# Patient Record
Sex: Male | Born: 1975
Health system: Southern US, Community
[De-identification: ages and names within clinical notes are randomized; demographics above are authoritative.]

## PROBLEM LIST (undated history)

## (undated) DIAGNOSIS — B019 Varicella without complication: Secondary | ICD-10-CM

## (undated) DIAGNOSIS — G4730 Sleep apnea, unspecified: Secondary | ICD-10-CM

## (undated) DIAGNOSIS — C801 Malignant (primary) neoplasm, unspecified: Secondary | ICD-10-CM

## (undated) DIAGNOSIS — E109 Type 1 diabetes mellitus without complications: Secondary | ICD-10-CM

## (undated) DIAGNOSIS — E039 Hypothyroidism, unspecified: Secondary | ICD-10-CM

## (undated) DIAGNOSIS — K219 Gastro-esophageal reflux disease without esophagitis: Secondary | ICD-10-CM

## (undated) DIAGNOSIS — Z8719 Personal history of other diseases of the digestive system: Secondary | ICD-10-CM

## (undated) DIAGNOSIS — R011 Cardiac murmur, unspecified: Secondary | ICD-10-CM

## (undated) DIAGNOSIS — J45909 Unspecified asthma, uncomplicated: Secondary | ICD-10-CM

## (undated) DIAGNOSIS — T7840XA Allergy, unspecified, initial encounter: Secondary | ICD-10-CM

## (undated) DIAGNOSIS — G43909 Migraine, unspecified, not intractable, without status migrainosus: Secondary | ICD-10-CM

## (undated) HISTORY — DX: Cardiac murmur, unspecified: R01.1

## (undated) HISTORY — DX: Unspecified asthma, uncomplicated: J45.909

## (undated) HISTORY — DX: Hypothyroidism, unspecified: E03.9

## (undated) HISTORY — DX: Sleep apnea, unspecified: G47.30

## (undated) HISTORY — DX: Type 1 diabetes mellitus without complications: E10.9

## (undated) HISTORY — DX: Varicella without complication: B01.9

## (undated) HISTORY — DX: Allergy, unspecified, initial encounter: T78.40XA

## (undated) HISTORY — DX: Migraine, unspecified, not intractable, without status migrainosus: G43.909

## (undated) HISTORY — DX: Malignant (primary) neoplasm, unspecified: C80.1

## (undated) HISTORY — DX: Gastro-esophageal reflux disease without esophagitis: K21.9

## (undated) HISTORY — DX: Personal history of other diseases of the digestive system: Z87.19

## (undated) HISTORY — PX: UPPER GI ENDOSCOPY: SHX6162

---

## 2004-08-04 ENCOUNTER — Ambulatory Visit: Payer: Self-pay | Admitting: Internal Medicine

## 2004-09-08 ENCOUNTER — Ambulatory Visit: Payer: Self-pay | Admitting: Internal Medicine

## 2004-09-09 ENCOUNTER — Ambulatory Visit: Payer: Self-pay | Admitting: Internal Medicine

## 2004-09-10 ENCOUNTER — Ambulatory Visit: Payer: Self-pay | Admitting: Gastroenterology

## 2004-09-15 ENCOUNTER — Ambulatory Visit: Payer: Self-pay | Admitting: Gastroenterology

## 2005-05-03 ENCOUNTER — Ambulatory Visit: Payer: Self-pay | Admitting: General Surgery

## 2005-08-02 HISTORY — PX: TONSILLECTOMY AND ADENOIDECTOMY: SUR1326

## 2006-01-28 ENCOUNTER — Ambulatory Visit: Payer: Self-pay | Admitting: Unknown Physician Specialty

## 2006-02-04 ENCOUNTER — Observation Stay: Payer: Self-pay | Admitting: Unknown Physician Specialty

## 2006-06-29 ENCOUNTER — Ambulatory Visit: Payer: Self-pay | Admitting: Family Medicine

## 2006-07-11 ENCOUNTER — Ambulatory Visit: Payer: Self-pay | Admitting: Gastroenterology

## 2006-08-20 ENCOUNTER — Ambulatory Visit: Payer: Self-pay

## 2006-11-23 ENCOUNTER — Emergency Department: Payer: Self-pay | Admitting: Internal Medicine

## 2007-01-19 ENCOUNTER — Ambulatory Visit: Payer: Self-pay | Admitting: Orthopaedic Surgery

## 2007-03-06 ENCOUNTER — Ambulatory Visit: Payer: Self-pay | Admitting: Neurology

## 2007-11-23 ENCOUNTER — Ambulatory Visit: Payer: Self-pay | Admitting: Unknown Physician Specialty

## 2008-02-03 ENCOUNTER — Emergency Department: Payer: Self-pay | Admitting: Emergency Medicine

## 2008-02-03 ENCOUNTER — Other Ambulatory Visit: Payer: Self-pay

## 2008-11-05 ENCOUNTER — Ambulatory Visit: Payer: Self-pay | Admitting: Unknown Physician Specialty

## 2008-11-28 ENCOUNTER — Ambulatory Visit: Payer: Self-pay | Admitting: Unknown Physician Specialty

## 2009-01-23 ENCOUNTER — Ambulatory Visit: Payer: Self-pay | Admitting: Unknown Physician Specialty

## 2009-08-25 ENCOUNTER — Emergency Department: Payer: Self-pay | Admitting: Emergency Medicine

## 2009-09-09 ENCOUNTER — Other Ambulatory Visit: Payer: Self-pay | Admitting: Unknown Physician Specialty

## 2010-04-27 ENCOUNTER — Other Ambulatory Visit: Payer: Self-pay | Admitting: Internal Medicine

## 2010-08-02 HISTORY — PX: APPENDECTOMY: SHX54

## 2011-07-23 ENCOUNTER — Ambulatory Visit: Payer: Self-pay | Admitting: General Surgery

## 2011-07-23 ENCOUNTER — Ambulatory Visit: Payer: Self-pay | Admitting: Family Medicine

## 2012-11-16 ENCOUNTER — Ambulatory Visit (INDEPENDENT_AMBULATORY_CARE_PROVIDER_SITE_OTHER): Payer: Managed Care, Other (non HMO) | Admitting: Internal Medicine

## 2012-11-16 ENCOUNTER — Encounter: Payer: Self-pay | Admitting: Internal Medicine

## 2012-11-16 VITALS — BP 110/70 | HR 88 | Temp 98.7°F | Ht 66.25 in | Wt 192.2 lb

## 2012-11-16 DIAGNOSIS — K219 Gastro-esophageal reflux disease without esophagitis: Secondary | ICD-10-CM

## 2012-11-16 DIAGNOSIS — G589 Mononeuropathy, unspecified: Secondary | ICD-10-CM

## 2012-11-16 DIAGNOSIS — R5383 Other fatigue: Secondary | ICD-10-CM

## 2012-11-16 DIAGNOSIS — G4733 Obstructive sleep apnea (adult) (pediatric): Secondary | ICD-10-CM

## 2012-11-16 DIAGNOSIS — R079 Chest pain, unspecified: Secondary | ICD-10-CM

## 2012-11-16 DIAGNOSIS — E7800 Pure hypercholesterolemia, unspecified: Secondary | ICD-10-CM

## 2012-11-16 DIAGNOSIS — E109 Type 1 diabetes mellitus without complications: Secondary | ICD-10-CM

## 2012-11-16 DIAGNOSIS — G629 Polyneuropathy, unspecified: Secondary | ICD-10-CM

## 2012-11-16 DIAGNOSIS — E119 Type 2 diabetes mellitus without complications: Secondary | ICD-10-CM

## 2012-11-16 DIAGNOSIS — E039 Hypothyroidism, unspecified: Secondary | ICD-10-CM

## 2012-11-16 DIAGNOSIS — R5381 Other malaise: Secondary | ICD-10-CM

## 2012-11-18 ENCOUNTER — Encounter: Payer: Self-pay | Admitting: Internal Medicine

## 2012-11-18 DIAGNOSIS — G4733 Obstructive sleep apnea (adult) (pediatric): Secondary | ICD-10-CM | POA: Insufficient documentation

## 2012-11-18 DIAGNOSIS — G629 Polyneuropathy, unspecified: Secondary | ICD-10-CM | POA: Insufficient documentation

## 2012-11-18 DIAGNOSIS — E039 Hypothyroidism, unspecified: Secondary | ICD-10-CM | POA: Insufficient documentation

## 2012-11-18 DIAGNOSIS — E109 Type 1 diabetes mellitus without complications: Secondary | ICD-10-CM | POA: Insufficient documentation

## 2012-11-18 DIAGNOSIS — K219 Gastro-esophageal reflux disease without esophagitis: Secondary | ICD-10-CM | POA: Insufficient documentation

## 2012-11-18 DIAGNOSIS — E7800 Pure hypercholesterolemia, unspecified: Secondary | ICD-10-CM | POA: Insufficient documentation

## 2012-11-18 NOTE — Assessment & Plan Note (Addendum)
On insulin.  In a diabetic study.  Last a1c 7.1.  Continues to follow up with his endocrinologist.  Continue lisinopril for renal protection.

## 2012-11-18 NOTE — Assessment & Plan Note (Signed)
Has known sleep apnea.  Using his CPAP.  Increased fatigue and daytime somnolence.  Will set up autotitration to confirm settings.

## 2012-11-18 NOTE — Progress Notes (Signed)
Subjective:    Patient ID: Eric Hayden, male    DOB: [birth year], 37 y.o.   MRN: [id number]  HPI 37 year old male with past history of Type I diabetes, hypothyroidism and hypercholesterolemia who comes in today to follow up on these issues as well as for a complete physical exam.  He states he is currently involved in a drug trial.  Is evaluated q 3 months.  One year in to a two year program.  Last a1c 7.1.  He has noticed more intense chest pain recently.  He has a history of neuropathic chest pain.  Takes Lyrica and this helps.  He has noticed recently some increased chest pain.  More intense.  Notices more with increased stress.  Has a long history of diabetes (20 plus years).  No acid reflux.  His reflux is controlled on prilosec and zantac.  Is fatigued.  Working two jobs and going to school.  Feels overwhelmed.  Has good support.  Has CPAP and is using.  Discussed making sure proper settings.     Past Medical History  Diagnosis Date  . Asthma   . Cancer   . Chicken pox   . Type 1 diabetes mellitus   . GERD (gastroesophageal reflux disease)   . Hx of oral aphthous ulcers   . Heart murmur   . Migraines   . Hypothyroidism     Outpatient Encounter Prescriptions as of 11/16/2012  Medication Sig Dispense Refill  . amitriptyline (ELAVIL) 25 MG tablet Take 25 mg by mouth daily.      Marland Kitchen atorvastatin (LIPITOR) 10 MG tablet Take 10 mg by mouth daily.      . insulin lispro (HUMALOG) 100 UNIT/ML injection Inject into the skin continuous as needed for high blood sugar.      . levothyroxine (SYNTHROID, LEVOTHROID) 100 MCG tablet Take 100 mcg by mouth daily before breakfast.      . lisinopril (PRINIVIL,ZESTRIL) 10 MG tablet Take 10 mg by mouth daily.      Marland Kitchen omeprazole (PRILOSEC) 20 MG capsule Take 20 mg by mouth every morning.      . pregabalin (LYRICA) 75 MG capsule Take 75 mg by mouth 2 (two) times daily.      . ranitidine (ZANTAC) 150 MG tablet Take 150 mg by mouth at bedtime.       No  facility-administered encounter medications on file as of 11/16/2012.    Review of Systems Patient denies any headache, lightheadedness or dizziness.  No sinus or allergy symptoms.  chest pain as outlined.  More intense pain.  Persistent intermittent episodes.  Has had diabetes 20 plus years.  No palpitations.  No increased shortness of breath, cough or congestion.  No nausea or vomiting.  Acid reflux controlled.  No abdominal pain or cramping.  No bowel change, such as diarrhea, constipation, BRBPR or melana.  No urine change.  Increased stress as outlined.       Objective:   Physical Exam Filed Vitals:   11/16/12 1038  BP: 110/70  Pulse: 88  Temp: 98.7 F (44.54 C)   37 year old male in no acute distress.  HEENT:  Nares - clear.  Oropharynx - without lesions. NECK:  Supple.  Nontender.  No audible carotid bruit.  HEART:  Appears to be regular.   LUNGS:  No crackles or wheezing audible.  Respirations even and unlabored.   RADIAL PULSE:  Equal bilaterally.  ABDOMEN:  Soft.  Nontender.  Bowel sounds present and normal.  No audible abdominal bruit.  GU:  Normal descended testicles.  No palpable testicular nodules.   RECTAL:  Not performed.   EXTREMITIES:  No increased edema present.  DP pulses palpable and equal bilaterally.   SKIN:  No lesions.         Assessment & Plan:  CARDIOVASCULAR.  Chest pain as outlined.  EKG obtained and revealed SR with no acute ischemic changes.  Given change in pain, increased intensity and long term diabetes (20 plus years), will obtain stress echo to evaluate further.  If any change in symptoms or problems, he is to be reevaluated.   INCREASED PSYCHOSOCIAL STRESSORS.  Increased stress as outlined.  Has good support.  Desires no further intervention.  Follow.    FATIGUE.  Feel multifactorial.  Check cbc, met c and tsh.  Also auto titration.  Follow.   HEALTH MAINTENANCE.  Physical today.  Check cholesterol.

## 2012-11-18 NOTE — Assessment & Plan Note (Signed)
On synthroid.  Check tsh.

## 2012-11-18 NOTE — Assessment & Plan Note (Signed)
On prilosec in the am and zantac in the evening.  Controlled.

## 2012-11-18 NOTE — Assessment & Plan Note (Signed)
Low cholesterol diet.  On lipitor.  Follow.  Check lipid profile and liver panel.

## 2012-11-18 NOTE — Assessment & Plan Note (Signed)
Previous w/up revealed neuropathic chest pain.  On lyrica.  Follow.

## 2012-11-22 ENCOUNTER — Other Ambulatory Visit (INDEPENDENT_AMBULATORY_CARE_PROVIDER_SITE_OTHER): Payer: Managed Care, Other (non HMO)

## 2012-11-22 ENCOUNTER — Encounter: Payer: Self-pay | Admitting: *Deleted

## 2012-11-22 DIAGNOSIS — R5381 Other malaise: Secondary | ICD-10-CM

## 2012-11-22 DIAGNOSIS — R5383 Other fatigue: Secondary | ICD-10-CM

## 2012-11-22 DIAGNOSIS — E7800 Pure hypercholesterolemia, unspecified: Secondary | ICD-10-CM

## 2012-11-22 DIAGNOSIS — E119 Type 2 diabetes mellitus without complications: Secondary | ICD-10-CM

## 2012-11-22 LAB — BASIC METABOLIC PANEL
Calcium: 9 mg/dL (ref 8.4–10.5)
GFR: 95.27 mL/min (ref >60.00)
Glucose, Bld: 101 mg/dL — ABNORMAL HIGH (ref 70–99)
Sodium: 139 mEq/L (ref 135–145)

## 2012-11-22 LAB — CBC WITH DIFFERENTIAL/PLATELET
Basophils Absolute: 0 10*3/uL (ref 0.0–0.1)
HCT: 38.6 % — ABNORMAL LOW (ref 39.0–52.0)
Lymphs Abs: 2.5 10*3/uL (ref 0.7–4.0)
MCV: 84.8 fl (ref 78.0–100.0)
Monocytes Absolute: 0.6 10*3/uL (ref 0.1–1.0)
Platelets: 248 10*3/uL (ref 150.0–400.0)
RDW: 13.3 % (ref 11.5–14.6)

## 2012-11-22 LAB — HEPATIC FUNCTION PANEL
ALT: 20 U/L (ref 0–53)
AST: 15 U/L (ref 0–37)
Total Bilirubin: 0.6 mg/dL (ref 0.3–1.2)

## 2012-11-22 LAB — LIPID PANEL
HDL: 25.5 mg/dL — ABNORMAL LOW (ref >39.00)
Triglycerides: 76 mg/dL (ref 0.0–149.0)

## 2012-11-22 LAB — MICROALBUMIN / CREATININE URINE RATIO: Microalb, Ur: 0.6 mg/dL (ref 0.0–1.9)

## 2012-12-20 ENCOUNTER — Ambulatory Visit: Payer: Managed Care, Other (non HMO) | Admitting: Internal Medicine

## 2013-01-24 ENCOUNTER — Encounter: Payer: Self-pay | Admitting: Internal Medicine

## 2013-01-24 ENCOUNTER — Ambulatory Visit (INDEPENDENT_AMBULATORY_CARE_PROVIDER_SITE_OTHER): Payer: Managed Care, Other (non HMO) | Admitting: Internal Medicine

## 2013-01-24 VITALS — BP 120/80 | HR 79 | Temp 97.9°F | Ht 66.25 in | Wt 194.5 lb

## 2013-01-24 DIAGNOSIS — G589 Mononeuropathy, unspecified: Secondary | ICD-10-CM

## 2013-01-24 DIAGNOSIS — K219 Gastro-esophageal reflux disease without esophagitis: Secondary | ICD-10-CM

## 2013-01-24 DIAGNOSIS — E039 Hypothyroidism, unspecified: Secondary | ICD-10-CM

## 2013-01-24 DIAGNOSIS — G629 Polyneuropathy, unspecified: Secondary | ICD-10-CM

## 2013-01-24 DIAGNOSIS — G4733 Obstructive sleep apnea (adult) (pediatric): Secondary | ICD-10-CM

## 2013-01-24 DIAGNOSIS — E7800 Pure hypercholesterolemia, unspecified: Secondary | ICD-10-CM

## 2013-01-24 DIAGNOSIS — E109 Type 1 diabetes mellitus without complications: Secondary | ICD-10-CM

## 2013-01-28 ENCOUNTER — Encounter: Payer: Self-pay | Admitting: Internal Medicine

## 2013-01-28 NOTE — Assessment & Plan Note (Signed)
On prilosec in the am and zantac in the evening.  Controlled.

## 2013-01-28 NOTE — Assessment & Plan Note (Signed)
Low cholesterol diet.  On lipitor.  Follow.  Follow lipid profile and liver panel.

## 2013-01-28 NOTE — Assessment & Plan Note (Signed)
Previous w/up revealed neuropathic chest pain.  On lyrica.  Follow.

## 2013-01-28 NOTE — Assessment & Plan Note (Signed)
Has known sleep apnea.  Using his CPAP.  Follow.

## 2013-01-28 NOTE — Assessment & Plan Note (Signed)
On insulin.  In a diabetic study.  Last a1c 7.5.  Continues to follow up with his endocrinologist.  Continue lisinopril for renal protection.  Will adjust lisinopril as outlined.

## 2013-01-28 NOTE — Assessment & Plan Note (Signed)
On synthroid.  Follow tsh.   

## 2013-01-28 NOTE — Progress Notes (Signed)
Subjective:    Patient ID: Eric Hayden, male    DOB: [birth year], 37 y.o.   MRN: [id number]  HPI 38 year old male with past history of Type I diabetes, hypothyroidism and hypercholesterolemia who comes in today for a scheduled follow up.   He is currently involved in a drug trial.  Is evaluated q 3 months.  One year in to a two year program.  Last a1c 7.5.  He had noticed more intense chest pain last visit.  He has a history of neuropathic chest pain.  Takes Lyrica and this helps.  Had a stress echo recently that revealed no acute ischemic changes.  Excellent exercise tolerance for age.  No further pain like he was experiencing.  Not having issues with low blood sugars.  Overall feels he is doing well.  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  States he briefly felt funny.  No LOC.  Walked off the field and drank water.  Just felt washed out after the event.  No neurological event.  The following week, he was at a [employer].  Just felt washed out.  No chest pain or tightness.  No sob.  Sugar was fine.  No LOC.  No neurological changes.  Has not had any episodes since.  Reports no symptoms with increased activity or exertion.  Overall now feels he is doing well.  Feels he is handling the increased stress well.     Past Medical History  Diagnosis Date  . Asthma   . Cancer   . Chicken pox   . Type 1 diabetes mellitus   . GERD (gastroesophageal reflux disease)   . Hx of oral aphthous ulcers   . Heart murmur   . Migraines   . Hypothyroidism     Outpatient Encounter Prescriptions as of 01/24/2013  Medication Sig Dispense Refill  . amitriptyline (ELAVIL) 25 MG tablet Take 25 mg by mouth daily.      Marland Kitchen atorvastatin (LIPITOR) 10 MG tablet Take 10 mg by mouth daily.      . insulin lispro (HUMALOG) 100 UNIT/ML injection Inject into the skin continuous as needed for high blood sugar.      . levothyroxine (SYNTHROID, LEVOTHROID) 100 MCG tablet Take 100 mcg by mouth daily before  breakfast.      . lisinopril (PRINIVIL,ZESTRIL) 10 MG tablet Take 10 mg by mouth daily.      Marland Kitchen omeprazole (PRILOSEC) 20 MG capsule Take 20 mg by mouth every morning.      . pregabalin (LYRICA) 75 MG capsule Take 75 mg by mouth 2 (two) times daily.      . ranitidine (ZANTAC) 150 MG tablet Take 150 mg by mouth at bedtime.       No facility-administered encounter medications on file as of 01/24/2013.    Review of Systems Patient denies any headache, lightheadedness or dizziness.  No sinus or allergy symptoms.  No further chest pain as he was experiencing previously.  No palpitations.  No increased shortness of breath, cough or congestion.  No nausea or vomiting.  Acid reflux controlled.  No abdominal pain or cramping.  No bowel change, such as diarrhea, constipation, BRBPR or melana.  No urine change.  Increased stress as outlined.  Feels he is handling things relatively well.  Had the episodes as outlined.  No further episodes.       Objective:   Physical Exam  Filed Vitals:   01/24/13 1025  BP:  120/80  Pulse: 79  Temp: 97.9 F (36.6 C)   Blood pressure recheck:  49/61  37 year old male in no acute distress.  HEENT:  Nares - clear.  Oropharynx - without lesions. NECK:  Supple.  Nontender.  No audible carotid bruit.  HEART:  Appears to be regular.   LUNGS:  No crackles or wheezing audible.  Respirations even and unlabored.   RADIAL PULSE:  Equal bilaterally.  ABDOMEN:  Soft.  Nontender.  Bowel sounds present and normal.  No audible abdominal bruit.  EXTREMITIES:  No increased edema present.  DP pulses palpable and equal bilaterally.   SKIN:  No lesions.         Assessment & Plan:  WEAKNESS.  Had the two episodes as outlined.  None recently.  Feels better.  Discussed staying hydrated.  Recent stress test negative.  No arrhythmia.  No neurological defects.  Will decrease Lisinopril to 50m q day.  Follow pressures.  Get him back in soon to reassess.  Follow.  Let me know if he has any  further episodes.   CARDIOVASCULAR.  Recent chest pain as outlined.  Stress test negative.  See official report for details.  Currently without symptoms.  Follow.    INCREASED PSYCHOSOCIAL STRESSORS.  Increased stress as outlined.  Has good support.  Desires no further intervention.  Follow.   HEALTH MAINTENANCE.  Physical last visit.

## 2013-02-21 ENCOUNTER — Encounter: Payer: Self-pay | Admitting: Internal Medicine

## 2013-02-23 ENCOUNTER — Encounter: Payer: Self-pay | Admitting: Unknown Physician Specialty

## 2013-02-26 ENCOUNTER — Encounter: Payer: Self-pay | Admitting: Internal Medicine

## 2013-03-07 ENCOUNTER — Encounter: Payer: Self-pay | Admitting: *Deleted

## 2013-03-09 ENCOUNTER — Encounter: Payer: Self-pay | Admitting: *Deleted

## 2013-03-19 ENCOUNTER — Encounter: Payer: Self-pay | Admitting: Internal Medicine

## 2013-05-22 ENCOUNTER — Ambulatory Visit (INDEPENDENT_AMBULATORY_CARE_PROVIDER_SITE_OTHER): Payer: Managed Care, Other (non HMO) | Admitting: Internal Medicine

## 2013-05-22 ENCOUNTER — Encounter: Payer: Self-pay | Admitting: Internal Medicine

## 2013-05-22 VITALS — BP 100/80 | HR 92 | Temp 98.2°F | Ht 66.25 in | Wt 193.5 lb

## 2013-05-22 DIAGNOSIS — E7800 Pure hypercholesterolemia, unspecified: Secondary | ICD-10-CM

## 2013-05-22 DIAGNOSIS — E109 Type 1 diabetes mellitus without complications: Secondary | ICD-10-CM

## 2013-05-22 DIAGNOSIS — G4733 Obstructive sleep apnea (adult) (pediatric): Secondary | ICD-10-CM

## 2013-05-22 DIAGNOSIS — G589 Mononeuropathy, unspecified: Secondary | ICD-10-CM

## 2013-05-22 DIAGNOSIS — K219 Gastro-esophageal reflux disease without esophagitis: Secondary | ICD-10-CM

## 2013-05-22 DIAGNOSIS — G629 Polyneuropathy, unspecified: Secondary | ICD-10-CM

## 2013-05-22 DIAGNOSIS — E039 Hypothyroidism, unspecified: Secondary | ICD-10-CM

## 2013-05-22 NOTE — Assessment & Plan Note (Signed)
On prilosec in the am and zantac in the evening.  Controlled.

## 2013-05-22 NOTE — Assessment & Plan Note (Addendum)
Low cholesterol diet.  On lipitor.  Follow.  Follow lipid profile and liver panel.  Due to get labs tomorrow.  Will send me results.

## 2013-05-22 NOTE — Assessment & Plan Note (Addendum)
On insulin.  In a diabetic study.  Last a1c 7.2.  Continues to follow up with his endocrinologist.  Up to date with eye exams.  Sees Dr Thomasene Ripple.  Will stop lisinopril for now.  Follow.

## 2013-05-22 NOTE — Assessment & Plan Note (Signed)
Previous w/up revealed neuropathic chest pain.  On lyrica.  Follow.

## 2013-05-22 NOTE — Progress Notes (Signed)
Subjective:    Patient ID: Eric Hayden, male    DOB: Oct 17, 1975, 37 y.o.   MRN: [id number]  HPI 37 year old male with past history of Type I diabetes, hypothyroidism and hypercholesterolemia who comes in today for a scheduled follow up.   He is currently involved in a drug trial.  Is evaluated q 3 months.  One year in to a two year program.  Last a1c 7.2.  He has a history of neuropathic chest pain.  Takes Lyrica and this is better.   Had a stress echo recently that revealed no acute ischemic changes.  Excellent exercise tolerance for age.  No further pain like he was experiencing.  Not having issues with low blood sugars.  Overall feels he is doing well.  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  States he briefly felt funny.  No LOC.  Walked off the field and drank water.  Just felt washed out after the event.  No neurological changes.  The following week, he was at a [employer].  Just felt washed out.  No chest pain or tightness.  No sob.  Sugar was fine.  No LOC.  No neurological changes.  Last visit, we decreased his lisinopril to 50m q day.   He reports that he had one episode since his last visit, where his sugar was elevated.  He had felt a little sick on his stomach.  Did not eat and went to a soccer game.  Had a very brief episode where he felt a little funny.  Resolved with increased water intake.  No other episodes.  Overall he feels he is doing relatively well.  Increased stress.  Feels he is handling things well.    Past Medical History  Diagnosis Date  . Asthma   . Cancer   . Chicken pox   . Type 1 diabetes mellitus   . GERD (gastroesophageal reflux disease)   . Hx of oral aphthous ulcers   . Heart murmur   . Migraines   . Hypothyroidism     Outpatient Encounter Prescriptions as of 05/22/2013  Medication Sig Dispense Refill  . amitriptyline (ELAVIL) 25 MG tablet Take 25 mg by mouth daily.      .Marland Kitchenatorvastatin (LIPITOR) 10 MG tablet Take 10 mg by mouth daily.       . insulin lispro (HUMALOG) 100 UNIT/ML injection Inject into the skin continuous as needed for high blood sugar.      . levothyroxine (SYNTHROID, LEVOTHROID) 100 MCG tablet Take 100 mcg by mouth daily before breakfast.      . lisinopril (PRINIVIL,ZESTRIL) 10 MG tablet Take 10 mg by mouth daily.      .Marland Kitchenomeprazole (PRILOSEC) 20 MG capsule Take 20 mg by mouth every morning.      . pregabalin (LYRICA) 75 MG capsule Take 75 mg by mouth 2 (two) times daily.      . ranitidine (ZANTAC) 150 MG tablet Take 150 mg by mouth at bedtime.       No facility-administered encounter medications on file as of 05/22/2013.    Review of Systems Patient denies any headache, lightheadedness or dizziness.  No sinus or allergy symptoms.  No further chest pain as he was experiencing previously.  No palpitations.  No increased shortness of breath, cough or congestion.  No nausea or vomiting.  Acid reflux controlled.  No abdominal pain or cramping.  No bowel change, such as diarrhea, constipation, BRBPR or melana.  No urine change.  Increased stress as outlined.  Feels he is handling things relatively well.  Had the episodes as outlined.  No further episodes.       Objective:   Physical Exam  Filed Vitals:   05/22/13 0802  BP: 100/80  Pulse: 92  Temp: 98.2 F (36.8 C)   Blood pressure recheck:  102/64 standing and 100/85 lying  37 year old male in no acute distress.  HEENT:  Nares - clear.  Oropharynx - without lesions. NECK:  Supple.  Nontender.  No audible carotid bruit.  HEART:  Appears to be regular.   LUNGS:  No crackles or wheezing audible.  Respirations even and unlabored.   RADIAL PULSE:  Equal bilaterally.  ABDOMEN:  Soft.  Nontender.  Bowel sounds present and normal.  No audible abdominal bruit.  EXTREMITIES:  No increased edema present.  DP pulses palpable and equal bilaterally.   FEET:  No lesions.         Assessment & Plan:  WEAKNESS.  Had the episodes as outlined.  None recently.  Feels  better.  Discussed staying hydrated.  Recent stress test negative.  No arrhythmia.  No neurological defects. Will stop the lisinopril for now.  Follow pressures.    CARDIOVASCULAR.  Recent chest pain as outlined.  Stress test negative.  See official report for details.  Currently without symptoms.  Follow.    INCREASED PSYCHOSOCIAL STRESSORS.  Increased stress as outlined.  Has good support.  Desires no further intervention.  Follow.   HEALTH MAINTENANCE.  Physical 11/16/12.

## 2013-05-22 NOTE — Assessment & Plan Note (Signed)
Has known sleep apnea.  Using his CPAP.  Follow.

## 2013-05-23 ENCOUNTER — Encounter: Payer: Self-pay | Admitting: Internal Medicine

## 2013-05-23 NOTE — Assessment & Plan Note (Signed)
On synthroid.  Follow tsh.   

## 2013-08-13 ENCOUNTER — Telehealth: Payer: Self-pay | Admitting: Internal Medicine

## 2013-08-13 NOTE — Telephone Encounter (Signed)
Need to know what is going on.  I can see him on Thursday 08/16/13 at 11:45, but I am not sure he feels he can wait this long.  Let me know if a problem.  If a problem, I can work him in at lunch on Wednesday - may have to wait.   Let me know if this is a problem.

## 2013-08-13 NOTE — Telephone Encounter (Signed)
Please advise 

## 2013-08-13 NOTE — Telephone Encounter (Signed)
Pt states he needs to be seen by Dr. Nicki Reaper for kidney pain.  States he is a diabetic.  No appt available this week.  Pt asking if Dr. Nicki Reaper can see him asap.

## 2013-08-14 NOTE — Telephone Encounter (Signed)
Noted  

## 2013-08-14 NOTE — Telephone Encounter (Signed)
Pt coming tomorrow at 10am

## 2013-08-15 ENCOUNTER — Encounter (INDEPENDENT_AMBULATORY_CARE_PROVIDER_SITE_OTHER): Payer: Self-pay

## 2013-08-15 ENCOUNTER — Ambulatory Visit (INDEPENDENT_AMBULATORY_CARE_PROVIDER_SITE_OTHER): Payer: BC Managed Care – PPO | Admitting: Internal Medicine

## 2013-08-15 ENCOUNTER — Encounter: Payer: Self-pay | Admitting: Internal Medicine

## 2013-08-15 VITALS — BP 100/60 | HR 90 | Temp 98.3°F | Ht 66.25 in | Wt 193.8 lb

## 2013-08-15 DIAGNOSIS — E109 Type 1 diabetes mellitus without complications: Secondary | ICD-10-CM

## 2013-08-15 DIAGNOSIS — R1084 Generalized abdominal pain: Secondary | ICD-10-CM

## 2013-08-15 DIAGNOSIS — M549 Dorsalgia, unspecified: Secondary | ICD-10-CM

## 2013-08-15 DIAGNOSIS — M545 Low back pain, unspecified: Secondary | ICD-10-CM

## 2013-08-15 LAB — POCT URINALYSIS DIPSTICK
BILIRUBIN UA: NEGATIVE
Blood, UA: NEGATIVE
Glucose, UA: 500
Ketones, UA: NEGATIVE
LEUKOCYTES UA: NEGATIVE
NITRITE UA: NEGATIVE
PH UA: 7
PROTEIN UA: NEGATIVE
Spec Grav, UA: 1.02
UROBILINOGEN UA: 0.2

## 2013-08-15 LAB — CBC WITH DIFFERENTIAL/PLATELET
Basophils Absolute: 0 10*3/uL (ref 0.0–0.1)
Basophils Relative: 0.4 % (ref 0.0–3.0)
Eosinophils Absolute: 0.3 10*3/uL (ref 0.0–0.7)
Eosinophils Relative: 3.1 % (ref 0.0–5.0)
HEMATOCRIT: 42.1 % (ref 39.0–52.0)
Hemoglobin: 14.3 g/dL (ref 13.0–17.0)
Lymphocytes Relative: 25.8 % (ref 12.0–46.0)
Lymphs Abs: 2.4 10*3/uL (ref 0.7–4.0)
MCHC: 33.9 g/dL (ref 30.0–36.0)
MCV: 83.2 fl (ref 78.0–100.0)
MONOS PCT: 5.9 % (ref 3.0–12.0)
Monocytes Absolute: 0.6 10*3/uL (ref 0.1–1.0)
NEUTROS PCT: 64.8 % (ref 43.0–77.0)
Neutro Abs: 6.1 10*3/uL (ref 1.4–7.7)
PLATELETS: 287 10*3/uL (ref 150.0–400.0)
RBC: 5.06 Mil/uL (ref 4.22–5.81)
RDW: 13.3 % (ref 11.5–14.6)
WBC: 9.4 10*3/uL (ref 4.5–10.5)

## 2013-08-15 LAB — BASIC METABOLIC PANEL
BUN: 17 mg/dL (ref 6–23)
CO2: 29 meq/L (ref 19–32)
Calcium: 9.4 mg/dL (ref 8.4–10.5)
Chloride: 100 mEq/L (ref 96–112)
Creatinine, Ser: 1 mg/dL (ref 0.4–1.5)
GFR: 88.41 mL/min (ref >60.00)
Glucose, Bld: 293 mg/dL — ABNORMAL HIGH (ref 70–99)
POTASSIUM: 4.8 meq/L (ref 3.5–5.1)
SODIUM: 135 meq/L (ref 135–145)

## 2013-08-15 MED ORDER — SULFAMETHOXAZOLE-TMP DS 800-160 MG PO TABS: 1.0000 | ORAL_TABLET | Freq: Two times a day (BID) | ORAL | Status: DC

## 2013-08-15 MED ORDER — OMEPRAZOLE 20 MG PO CPDR: 20.0000 mg | DELAYED_RELEASE_CAPSULE | Freq: Every morning | ORAL | Status: DC

## 2013-08-15 MED ORDER — ATORVASTATIN CALCIUM 10 MG PO TABS: 10.0000 mg | ORAL_TABLET | Freq: Every day | ORAL | Status: DC

## 2013-08-15 MED ORDER — AMITRIPTYLINE HCL 25 MG PO TABS: 25.0000 mg | ORAL_TABLET | Freq: Every day | ORAL | Status: DC

## 2013-08-15 MED ORDER — LISINOPRIL 10 MG PO TABS: 10.0000 mg | ORAL_TABLET | Freq: Every day | ORAL | Status: DC

## 2013-08-15 MED ORDER — LEVOTHYROXINE SODIUM 100 MCG PO TABS
100.0000 ug | ORAL_TABLET | Freq: Every day | ORAL | Status: DC
Start: 2013-08-15 — End: 2014-01-29

## 2013-08-15 MED ORDER — NYSTATIN 100000 UNIT/GM EX CREA
1.0000 | TOPICAL_CREAM | Freq: Two times a day (BID) | CUTANEOUS | Status: DC
Start: 2013-08-15 — End: 2014-05-20

## 2013-08-15 NOTE — Progress Notes (Signed)
Pre-visit discussion using our clinic review tool. No additional management support is needed unless otherwise documented below in the visit note.  

## 2013-08-16 LAB — CULTURE, URINE COMPREHENSIVE
Colony Count: NO GROWTH
Organism ID, Bacteria: NO GROWTH

## 2013-08-19 ENCOUNTER — Encounter: Payer: Self-pay | Admitting: Internal Medicine

## 2013-08-19 DIAGNOSIS — M549 Dorsalgia, unspecified: Secondary | ICD-10-CM | POA: Insufficient documentation

## 2013-08-19 NOTE — Assessment & Plan Note (Signed)
Describes back pain with urinary hesitancy.  Urinalysis negative.  Will send for culture.  Check metabolic panel.  Cover for prostatitis with septra.  No bowel changes.  Stay hydrated.  Further w/up pending response.  He knows if any change in symptoms or persistent problems, he is to be reevaluated.

## 2013-08-19 NOTE — Assessment & Plan Note (Signed)
On insulin.  Was in a diabetic study.  Through with the clinical trial now.  Continues to follow up with his endocrinologist.  Up to date with eye exams.  Sees Dr Thomasene Ripple.   Follow.

## 2013-08-19 NOTE — Progress Notes (Signed)
Subjective:    Patient ID: Eric Hayden, male    DOB: March 27, 1976, 38 y.o.   MRN: [id number]  Flank Pain  38 year old male with past history of Type I diabetes, hypothyroidism and hypercholesterolemia who comes in today as a work in with concerns regarding increased lower back pain and some previous urinary hesitancy.  States symptoms started one week ago with lower back pain.  Increase in sugars last week.  This improved.  Symptoms improved some and then over the weekend worsened.  Lower back pain.  No radiation of pain.  No dysuria.  Some hesitancy.  No hematuria.  On one occasion, noticed pain in testicle.  This has resolved.  No injury or trauma.  No nausea or vomiting.  No fever.  Has taken advil.     Past Medical History  Diagnosis Date  . Asthma   . Cancer   . Chicken pox   . Type 1 diabetes mellitus   . GERD (gastroesophageal reflux disease)   . Hx of oral aphthous ulcers   . Heart murmur   . Migraines   . Hypothyroidism     Outpatient Encounter Prescriptions as of 08/15/2013  Medication Sig  . amitriptyline (ELAVIL) 25 MG tablet Take 1 tablet (25 mg total) by mouth daily.  Marland Kitchen atorvastatin (LIPITOR) 10 MG tablet Take 1 tablet (10 mg total) by mouth daily.  . insulin lispro (HUMALOG) 100 UNIT/ML injection Inject into the skin continuous as needed for high blood sugar.  . levothyroxine (SYNTHROID, LEVOTHROID) 100 MCG tablet Take 1 tablet (100 mcg total) by mouth daily before breakfast.  . lisinopril (PRINIVIL,ZESTRIL) 10 MG tablet Take 1 tablet (10 mg total) by mouth daily.  Marland Kitchen omeprazole (PRILOSEC) 20 MG capsule Take 1 capsule (20 mg total) by mouth every morning.  . pregabalin (LYRICA) 75 MG capsule Take 75 mg by mouth 2 (two) times daily.  . ranitidine (ZANTAC) 150 MG tablet Take 150 mg by mouth at bedtime.  . [DISCONTINUED] amitriptyline (ELAVIL) 25 MG tablet Take 25 mg by mouth daily.  . [DISCONTINUED] atorvastatin (LIPITOR) 10 MG tablet Take 10 mg by mouth daily.  .  [DISCONTINUED] levothyroxine (SYNTHROID, LEVOTHROID) 100 MCG tablet Take 100 mcg by mouth daily before breakfast.  . [DISCONTINUED] lisinopril (PRINIVIL,ZESTRIL) 10 MG tablet Take 10 mg by mouth daily.  . [DISCONTINUED] omeprazole (PRILOSEC) 20 MG capsule Take 20 mg by mouth every morning.  . nystatin cream (MYCOSTATIN) Apply 1 application topically 2 (two) times daily.  Marland Kitchen sulfamethoxazole-trimethoprim (BACTRIM DS) 800-160 MG per tablet Take 1 tablet by mouth 2 (two) times daily.    Review of Systems  Genitourinary: Positive for flank pain.  Patient denies any headache, lightheadedness or dizziness.  No sinus or allergy symptoms.  No further chest pain as he was experiencing previously.  No palpitations.  No increased shortness of breath, cough or congestion.  No nausea or vomiting.  Acid reflux controlled.  No abdominal pain or cramping.  No bowel change, such as diarrhea, constipation, BRBPR or melana. Urinary hesitancy as outlined.  No dysuria.  No hematuria.  Lower back pain as outlined.        Objective:   Physical Exam  Filed Vitals:   08/15/13 1016  BP: 100/60  Pulse: 90  Temp: 98.3 F (36.8 C)   Blood pressure recheck:  16/60  38 year old male in no acute distress.  HEENT:  Nares - clear.  Oropharynx - without lesions. NECK:  Supple.  HEART:  Appears  to be regular.   LUNGS:  No crackles or wheezing audible.  Respirations even and unlabored.   RADIAL PULSE:  Equal bilaterally.  ABDOMEN:  Soft.  Nontender.  Bowel sounds present and normal.  No audible abdominal bruit.  GU:  Normal descended testicles.  No pain to palpation.   RECTAL:  Mildly enlarged prostate.  Heme negative.          Assessment & Plan:  CARDIOVASCULAR.  Recent chest pain as outlined.  Stress test negative.  See official report for details.  Currently without symptoms.  Follow.    HEALTH MAINTENANCE.  Physical 11/16/12.

## 2013-08-23 ENCOUNTER — Ambulatory Visit (INDEPENDENT_AMBULATORY_CARE_PROVIDER_SITE_OTHER): Payer: BC Managed Care – PPO | Admitting: Internal Medicine

## 2013-08-23 ENCOUNTER — Encounter: Payer: Self-pay | Admitting: Internal Medicine

## 2013-08-23 ENCOUNTER — Encounter: Payer: Self-pay | Admitting: *Deleted

## 2013-08-23 VITALS — BP 110/70 | HR 96 | Temp 97.9°F | Ht 66.25 in | Wt 197.8 lb

## 2013-08-23 DIAGNOSIS — G589 Mononeuropathy, unspecified: Secondary | ICD-10-CM

## 2013-08-23 DIAGNOSIS — E039 Hypothyroidism, unspecified: Secondary | ICD-10-CM

## 2013-08-23 DIAGNOSIS — K219 Gastro-esophageal reflux disease without esophagitis: Secondary | ICD-10-CM

## 2013-08-23 DIAGNOSIS — M549 Dorsalgia, unspecified: Secondary | ICD-10-CM

## 2013-08-23 DIAGNOSIS — G629 Polyneuropathy, unspecified: Secondary | ICD-10-CM

## 2013-08-23 DIAGNOSIS — G4733 Obstructive sleep apnea (adult) (pediatric): Secondary | ICD-10-CM

## 2013-08-23 DIAGNOSIS — E109 Type 1 diabetes mellitus without complications: Secondary | ICD-10-CM

## 2013-08-23 DIAGNOSIS — E7800 Pure hypercholesterolemia, unspecified: Secondary | ICD-10-CM

## 2013-08-23 MED ORDER — PREGABALIN 75 MG PO CAPS: 75.0000 mg | ORAL_CAPSULE | Freq: Two times a day (BID) | ORAL | Status: DC

## 2013-08-23 MED ORDER — INSULIN LISPRO 100 UNIT/ML ~~LOC~~ SOLN: SUBCUTANEOUS | Status: DC

## 2013-08-23 NOTE — Progress Notes (Signed)
Pre-visit discussion using our clinic review tool. No additional management support is needed unless otherwise documented below in the visit note.  

## 2013-08-26 ENCOUNTER — Encounter: Payer: Self-pay | Admitting: Internal Medicine

## 2013-08-26 NOTE — Assessment & Plan Note (Signed)
Has known sleep apnea.  Using his CPAP.  Follow.

## 2013-08-26 NOTE — Assessment & Plan Note (Signed)
Described back pain with urinary hesitancy.  See last note for details.  Urinalysis negative.  Urine culture negative.  Treated for possible prostatitis with septra.  Symptoms resolved.  Back to baseline.  Follow.

## 2013-08-26 NOTE — Assessment & Plan Note (Signed)
On prilosec in the am and zantac in the evening.  Controlled.

## 2013-08-26 NOTE — Assessment & Plan Note (Signed)
On synthroid.  Follow tsh.   

## 2013-08-26 NOTE — Assessment & Plan Note (Signed)
Low cholesterol diet.  On lipitor.  Follow.  Follow lipid profile and liver panel.  Due to get labs through [facility].   Will send me results.

## 2013-08-26 NOTE — Progress Notes (Signed)
Subjective:    Patient ID: Eric Hayden, male    DOB: 1976/05/16, 38 y.o.   MRN: [id number]  HPI 38 year old male with past history of Type I diabetes, hypothyroidism and hypercholesterolemia who comes in today for a scheduled follow up.   He was involved in a drug trial.  Was evaluated q 3 months.   Last a1c 7.2.  He is out of the study now.  Sugars starting to increase.  Plans to see his endocrinologist next week.  Due to see Dr Elisabeth Most.  He has a history of neuropathic chest pain.  Takes Lyrica and this is better.   Had a stress echo recently that revealed no acute ischemic changes.  Excellent exercise tolerance for age.  No further pain like he was experiencing.  Not having issues with low blood sugars.  Overall feels he is doing well.  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  States he briefly felt funny.  No LOC.  Walked off the field and drank water.  Just felt washed out after the event.  No neurological changes.  The following week, he was at a [employer].  Just felt washed out.  No chest pain or tightness.  No sob.  Sugar was fine.  No LOC.  No neurological changes.  See previous notes for details.  We stopped his lisinopril.  Has had no further episodes.   He was experiencing some flank pain.  See last note for details.  Was placed on septra.  Doing better now. Symptoms have completely resolved.  Bowels normal.  No nausea or vomiting.  Overall feels back to his baseline.  Feels he is handling stress well.     Past Medical History  Diagnosis Date  . Asthma   . Cancer   . Chicken pox   . Type 1 diabetes mellitus   . GERD (gastroesophageal reflux disease)   . Hx of oral aphthous ulcers   . Heart murmur   . Migraines   . Hypothyroidism     Outpatient Encounter Prescriptions as of 08/23/2013  Medication Sig  . amitriptyline (ELAVIL) 25 MG tablet Take 1 tablet (25 mg total) by mouth daily.  Marland Kitchen atorvastatin (LIPITOR) 10 MG tablet Take 1 tablet (10 mg total) by mouth  daily.  . insulin lispro (HUMALOG) 100 UNIT/ML injection As directed.  Dispense 4 vials.  Marland Kitchen levothyroxine (SYNTHROID, LEVOTHROID) 100 MCG tablet Take 1 tablet (100 mcg total) by mouth daily before breakfast.  . lisinopril (PRINIVIL,ZESTRIL) 10 MG tablet Take 1 tablet (10 mg total) by mouth daily.  Marland Kitchen nystatin cream (MYCOSTATIN) Apply 1 application topically 2 (two) times daily.  Marland Kitchen omeprazole (PRILOSEC) 20 MG capsule Take 1 capsule (20 mg total) by mouth every morning.  . pregabalin (LYRICA) 75 MG capsule Take 1 capsule (75 mg total) by mouth 2 (two) times daily.  . ranitidine (ZANTAC) 150 MG tablet Take 150 mg by mouth at bedtime.  . sulfamethoxazole-trimethoprim (BACTRIM DS) 800-160 MG per tablet Take 1 tablet by mouth 2 (two) times daily.  . [DISCONTINUED] insulin lispro (HUMALOG) 100 UNIT/ML injection Inject into the skin continuous as needed for high blood sugar.  . [DISCONTINUED] pregabalin (LYRICA) 75 MG capsule Take 75 mg by mouth 2 (two) times daily.    Review of Systems Patient denies any headache, lightheadedness or dizziness.  No sinus or allergy symptoms.  No further chest pain as he was experiencing previously.  No palpitations.  No increased shortness  of breath, cough or congestion.  No nausea or vomiting.  Acid reflux controlled.  No abdominal pain or cramping.  No bowel change, such as diarrhea, constipation, BRBPR or melana.  No urine change.  Feels he is handling stress relatively well.  Feels back to his baseline.       Objective:   Physical Exam  Filed Vitals:   08/23/13 0759  BP: 110/70  Pulse: 96  Temp: 97.9 F (36.6 C)   Blood pressure recheck:  73/61  38 year old male in no acute distress.  HEENT:  Nares - clear.  Oropharynx - without lesions. NECK:  Supple.  Nontender.  No audible carotid bruit.  HEART:  Appears to be regular.   LUNGS:  No crackles or wheezing audible.  Respirations even and unlabored.   RADIAL PULSE:  Equal bilaterally.  ABDOMEN:  Soft.   Nontender.  Bowel sounds present and normal.  No audible abdominal bruit.  EXTREMITIES:  No increased edema present.  DP pulses palpable and equal bilaterally.   FEET:  No lesions.         Assessment & Plan:  CARDIOVASCULAR.  Recent chest pain as outlined.  Stress test negative.  See official report for details.  Currently without symptoms.  Follow.    INCREASED PSYCHOSOCIAL STRESSORS.  Increased stress as outlined.  Has good support.  Desires no further intervention.  Follow.   HEALTH MAINTENANCE.  Physical 11/16/12.

## 2013-08-26 NOTE — Assessment & Plan Note (Signed)
Previous w/up revealed neuropathic chest pain.  On lyrica.  Follow.

## 2013-08-26 NOTE — Assessment & Plan Note (Signed)
On insulin.  Was in a diabetic study.  Through with the clinical trial now.  Continues to follow up with his endocrinologist.  Up to date with eye exams.  Sees Dr Thomasene Ripple.   Follow.

## 2013-08-28 ENCOUNTER — Telehealth: Payer: Self-pay | Admitting: *Deleted

## 2013-08-28 NOTE — Telephone Encounter (Signed)
Refill Request  Test strips

## 2013-08-29 NOTE — Telephone Encounter (Signed)
Phoned pharmacy & approved refill

## 2013-09-21 ENCOUNTER — Encounter: Payer: Self-pay | Admitting: Internal Medicine

## 2014-01-29 ENCOUNTER — Other Ambulatory Visit: Payer: Self-pay | Admitting: Internal Medicine

## 2014-02-25 ENCOUNTER — Other Ambulatory Visit: Payer: Self-pay | Admitting: Internal Medicine

## 2014-02-26 ENCOUNTER — Encounter: Payer: BC Managed Care – PPO | Admitting: Internal Medicine

## 2014-03-11 ENCOUNTER — Other Ambulatory Visit: Payer: Self-pay | Admitting: Internal Medicine

## 2014-03-18 ENCOUNTER — Other Ambulatory Visit: Payer: Self-pay | Admitting: Internal Medicine

## 2014-04-01 ENCOUNTER — Other Ambulatory Visit: Payer: Self-pay | Admitting: Internal Medicine

## 2014-04-06 LAB — HEPATIC FUNCTION PANEL
ALT: 30 U/L (ref 10–40)
AST: 24 U/L (ref 14–40)
Alkaline Phosphatase: 102 U/L (ref 25–125)

## 2014-04-06 LAB — TSH: TSH: 5.11 u[IU]/mL (ref 0.41–5.90)

## 2014-04-06 LAB — BASIC METABOLIC PANEL WITH GFR
BUN: 16 mg/dL (ref 4–21)
Creatinine: 1.1 mg/dL (ref 0.6–1.3)
Glucose: 251 mg/dL
Potassium: 5.2 mmol/L (ref 3.4–5.3)
Sodium: 141 mmol/L (ref 137–147)

## 2014-04-06 LAB — LIPID PANEL
CHOLESTEROL: 134 mg/dL (ref 0–200)
LDL Cholesterol: 86 mg/dL

## 2014-04-06 LAB — HEMOGLOBIN A1C: Hgb A1c MFr Bld: 7.3 % — AB (ref 4.0–6.0)

## 2014-04-10 ENCOUNTER — Telehealth: Payer: Self-pay | Admitting: Internal Medicine

## 2014-04-10 NOTE — Telephone Encounter (Signed)
LMTCB to schedule cpe [email or web address]

## 2014-04-10 NOTE — Telephone Encounter (Signed)
Pt is overdue a physical.  Need to schedule.  Thanks.

## 2014-04-16 ENCOUNTER — Other Ambulatory Visit: Payer: Self-pay | Admitting: Internal Medicine

## 2014-04-16 NOTE — Telephone Encounter (Signed)
Appt 10/29

## 2014-05-18 ENCOUNTER — Emergency Department: Payer: Self-pay | Admitting: Emergency Medicine

## 2014-05-18 LAB — URINALYSIS, COMPLETE
Bacteria: NONE SEEN
Bilirubin,UR: NEGATIVE
Blood: NEGATIVE
Glucose,UR: NEGATIVE mg/dL (ref 0–75)
Leukocyte Esterase: NEGATIVE
NITRITE: NEGATIVE
Ph: 9 (ref 4.5–8.0)
Protein: 30
RBC,UR: 1 /HPF (ref 0–5)
SQUAMOUS EPITHELIAL: NONE SEEN
Specific Gravity: 1.02 (ref 1.003–1.030)

## 2014-05-18 LAB — COMPREHENSIVE METABOLIC PANEL
ALBUMIN: 3.4 g/dL (ref 3.4–5.0)
ALK PHOS: 120 U/L — AB
Anion Gap: 7 (ref 7–16)
BUN: 12 mg/dL (ref 7–18)
Bilirubin,Total: 0.5 mg/dL (ref 0.2–1.0)
CO2: 26 mmol/L (ref 21–32)
CREATININE: 1.1 mg/dL (ref 0.60–1.30)
Calcium, Total: 8.6 mg/dL (ref 8.5–10.1)
Chloride: 106 mmol/L (ref 98–107)
EGFR (African American): >60
GLUCOSE: 153 mg/dL — AB (ref 65–99)
Osmolality: 280 (ref 275–301)
POTASSIUM: 4.5 mmol/L (ref 3.5–5.1)
SGOT(AST): 37 U/L (ref 15–37)
SGPT (ALT): 40 U/L
Sodium: 139 mmol/L (ref 136–145)
TOTAL PROTEIN: 7.9 g/dL (ref 6.4–8.2)

## 2014-05-18 LAB — CBC WITH DIFFERENTIAL/PLATELET
Basophil #: 0.1 10*3/uL (ref 0.0–0.1)
Basophil %: 0.6 %
EOS PCT: 0.9 %
Eosinophil #: 0.1 10*3/uL (ref 0.0–0.7)
HCT: 44.5 % (ref 40.0–52.0)
HGB: 14.2 g/dL (ref 13.0–18.0)
LYMPHS ABS: 1.4 10*3/uL (ref 1.0–3.6)
Lymphocyte %: 15.5 %
MCH: 26.7 pg (ref 26.0–34.0)
MCHC: 32 g/dL (ref 32.0–36.0)
MCV: 83 fL (ref 80–100)
MONO ABS: 0.7 x10 3/mm (ref 0.2–1.0)
Monocyte %: 7.4 %
NEUTROS PCT: 75.6 %
Neutrophil #: 7 10*3/uL — ABNORMAL HIGH (ref 1.4–6.5)
PLATELETS: 248 10*3/uL (ref 150–440)
RBC: 5.33 10*6/uL (ref 4.40–5.90)
RDW: 14.1 % (ref 11.5–14.5)
WBC: 9.3 10*3/uL (ref 3.8–10.6)

## 2014-05-18 LAB — SEDIMENTATION RATE: Erythrocyte Sed Rate: 22 mm/hr — ABNORMAL HIGH (ref 0–15)

## 2014-05-20 ENCOUNTER — Telehealth: Payer: Self-pay | Admitting: Internal Medicine

## 2014-05-20 ENCOUNTER — Encounter: Payer: Self-pay | Admitting: Internal Medicine

## 2014-05-20 ENCOUNTER — Ambulatory Visit (INDEPENDENT_AMBULATORY_CARE_PROVIDER_SITE_OTHER): Payer: BC Managed Care – PPO | Admitting: Internal Medicine

## 2014-05-20 VITALS — BP 110/70 | HR 107 | Temp 98.5°F | Ht 66.25 in | Wt 193.5 lb

## 2014-05-20 DIAGNOSIS — R519 Headache, unspecified: Secondary | ICD-10-CM | POA: Insufficient documentation

## 2014-05-20 DIAGNOSIS — E1040 Type 1 diabetes mellitus with diabetic neuropathy, unspecified: Secondary | ICD-10-CM

## 2014-05-20 DIAGNOSIS — R1084 Generalized abdominal pain: Secondary | ICD-10-CM

## 2014-05-20 DIAGNOSIS — R51 Headache: Secondary | ICD-10-CM

## 2014-05-20 DIAGNOSIS — R109 Unspecified abdominal pain: Secondary | ICD-10-CM | POA: Insufficient documentation

## 2014-05-20 MED ORDER — PREDNISONE 5 MG PO TABS: ORAL_TABLET | ORAL | Status: DC

## 2014-05-20 NOTE — Progress Notes (Signed)
Subjective:    Patient ID: Eric Hayden, male    DOB: [birth year], 38 y.o.   MRN: [id number]  Dizziness  38 year old male with past history of Type I diabetes, hypothyroidism and hypercholesterolemia who comes in today as a work in with concerns regarding persistent headache, light headedness, light sensitivity and previous abdominal pain and nausea/vomiting.  States symptoms started five days ago.  Started with emesis.  Felt better later that day.  Some decreased appetite.  The following day another episode - emesis and more frequent stools.  No diarrhea.  Headache started.  Felt worse the following day.  Developed a rash.  Itched.  Some abdominal discomfort.  Went to acute care.  Taking benadryl.  That evening abdominal pain and headache worsened.  To ER.  Labs unrevealing.  CT head - negative.  Given IVFs.  Diagnosed with gastroparesis.  Discharged.  Worked in today with persistent headache.  Worse if stands or goes from lying to sitting.  GI symptoms better.  Ate lunch today. No low blood sugars.  Sugars mostly averaging 200s.  Some light sensitivity.  No neck stiffness.     Past Medical History  Diagnosis Date  . Asthma   . Cancer   . Chicken pox   . Type 1 diabetes mellitus   . GERD (gastroesophageal reflux disease)   . Hx of oral aphthous ulcers   . Heart murmur   . Migraines   . Hypothyroidism     Outpatient Encounter Prescriptions as of 05/20/2014  Medication Sig  . amitriptyline (ELAVIL) 25 MG tablet TAKE 1 TABLET BY MOUTH DAILY  . insulin lispro (HUMALOG) 100 UNIT/ML injection As directed.  Dispense 4 vials.  Marland Kitchen levothyroxine (SYNTHROID, LEVOTHROID) 100 MCG tablet TAKE 1 TABLET BY MOUTH DAILY BEFORE BREAKFAST  . lisinopril (PRINIVIL,ZESTRIL) 10 MG tablet Take 1 tablet (10 mg total) by mouth daily.  Marland Kitchen LYRICA 75 MG capsule TAKE 1 CAPSULE TWICE DAILY  . metoCLOPramide (REGLAN) 10 MG tablet Take 10 mg by mouth every 8 (eight) hours as needed for nausea.  Marland Kitchen omeprazole (PRILOSEC)  20 MG capsule TAKE 1 CAPSULE EVERY MORNING  . ranitidine (ZANTAC) 150 MG tablet Take 150 mg by mouth at bedtime.  . predniSONE (DELTASONE) 5 MG tablet Take 6 tablets x 1 day and then decrease by 1 tablet per day until down to zero mg.  . [DISCONTINUED] atorvastatin (LIPITOR) 10 MG tablet TAKE 1 TABLET BY MOUTH DAILY  . [DISCONTINUED] nystatin cream (MYCOSTATIN) Apply 1 application topically 2 (two) times daily.  . [DISCONTINUED] sulfamethoxazole-trimethoprim (BACTRIM DS) 800-160 MG per tablet Take 1 tablet by mouth 2 (two) times daily.    Review of Systems  Neurological: Positive for dizziness.  Headache, light headedness and dizziness as outlined.  No sinus or allergy symptoms.  No further chest pain as he was experiencing previously.   No nausea or vomiting now.  previous nausea and vomiting as outlined.   Abdominal pain better.   No bowel change, such as diarrhea.  Stool not as frequent.  Ate lunch today.  Light sensitivity.  Rash improved.  Previous itching.  Taking benadryl.       Objective:   Physical Exam  Filed Vitals:   05/20/14 1511  BP: 110/70  Pulse: 107  Temp: 98.5 F (76.57 C)   38 year old male in no acute distress.  HEENT:  Nares - clear.  Oropharynx - without lesions. NECK:  Supple.  Nontender.  No neck stiffness.   HEART:  Appears to be regular.   LUNGS:  No crackles or wheezing audible.  Respirations even and unlabored.  ABDOMEN:  Soft.  No significant tenderness to palpation.  Bowel sounds present and normal.  No audible abdominal bruit.  EXTREMITIES:  No increased edema present.  DP pulses palpable and equal bilaterally.   SKIN:  Faint rash - arms, abdomen, chest and back.         Assessment & Plan:   Problem List Items Addressed This Visit   Abdominal pain     GI symptoms better.  Pain better.  He is on reglan.  Ate today.  No nausea or vomiting now.  No diarrhea.  Hold on further w/up since improved.  Follow.       Headache - Primary     Pt with  persistent headache.  Unclear etiology.  Is worse when he stands or goes from a lying to sitting position.  No neck stiffness.  CT head negative.  Is some better today, but persistent.  Discussed with neurology.  Dr Manuella Ghazi to see pt on 05/22/14.  Will treat with low dose prednisone taper starting at 2m and decreasing by 522meach day until off.  Call with update tomorrow.  If any change or worsening problems, he is to be evaluated immediately.      Type 1 diabetes mellitus     Pt is diabetic.  We discussed the prednisone and monitoring his sugars closely.  Can adjust insulin if needed.  Follow.

## 2014-05-20 NOTE — Telephone Encounter (Signed)
Spoke with patient & he is coming right now. ER records requested

## 2014-05-20 NOTE — Assessment & Plan Note (Signed)
Pt is diabetic.  We discussed the prednisone and monitoring his sugars closely.  Can adjust insulin if needed.  Follow.

## 2014-05-20 NOTE — Patient Instructions (Signed)
appt with Dr Manuella Ghazi - 3:45 Wednesday 05/22/14

## 2014-05-20 NOTE — Progress Notes (Signed)
Pre visit review using our clinic review tool, if applicable. No additional management support is needed unless otherwise documented below in the visit note. 

## 2014-05-20 NOTE — Assessment & Plan Note (Signed)
Pt with persistent headache.  Unclear etiology.  Is worse when he stands or goes from a lying to sitting position.  No neck stiffness.  CT head negative.  Is some better today, but persistent.  Discussed with neurology.  Dr Manuella Ghazi to see pt on 05/22/14.  Will treat with low dose prednisone taper starting at 82m and decreasing by 515meach day until off.  Call with update tomorrow.  If any change or worsening problems, he is to be evaluated immediately.

## 2014-05-20 NOTE — Telephone Encounter (Signed)
Pt's wife called frustrated because she told them this morning that he has already been to Acute Care & then sent to the ER & the ER told them to f/u with PCP. She states that she called @ 8:00 & had to hold for 20 minutes. She asked,  "Whats the point of having a PCP if you cant see them". I told her that I would send the message to Dr. Nicki Reaper & see what the next step would be. She asked could he be seen tomorrow. I told her that I would have to ask Dr. Nicki Reaper. She then stated that this is frustrating that she still doesn't have an appt & asked how would I feel if this was my family member. I again told her that I would get back with her today after speaking to Dr. Nicki Reaper.

## 2014-05-20 NOTE — Telephone Encounter (Signed)
Eric Hayden, the pt's wife called saying he was in the ER on Sat night. He had a severe headache, stomach pain, and a rash. He was told if he didn't feel better to contact his PCP. Per Eric Hayden, Eric Hayden is not feeling better and is in a great deal of pain unable to function. She's wondering if he can be worked in sometime today or this week. Please call the patient. Pt ph# [phone] Thank you.

## 2014-05-20 NOTE — Assessment & Plan Note (Signed)
GI symptoms better.  Pain better.  He is on reglan.  Ate today.  No nausea or vomiting now.  No diarrhea.  Hold on further w/up since improved.  Follow.

## 2014-05-20 NOTE — Telephone Encounter (Signed)
Please notify that I agree with evaluation today.  If he is having increased headache, rash and stomach pain - does need evaluation today.  Since I am ending early, not able to work in today.  I don;t want him to wait.  I recommend acute care.  Can do stat labs if needed.  Can f/u with me after.  Needs ER records and scans.

## 2014-05-20 NOTE — Telephone Encounter (Signed)
Tell them to come on in now.  I will work him in today.  This will be better than waiting until tomorrow.  Need ER records asap.

## 2014-05-21 ENCOUNTER — Telehealth: Payer: Self-pay | Admitting: *Deleted

## 2014-05-21 NOTE — Telephone Encounter (Signed)
Noted  

## 2014-05-21 NOTE — Telephone Encounter (Signed)
I called to check on patient today after he was seen in office yesterday. Pt states that Prednisone is helping & rash has improved. Stomach is okay today & still had a slight headache. Headache stopped about 2 hours ago. Blood sugars have been running between 200-300 & he slept better last night & took a nap today also.

## 2014-05-23 LAB — CULTURE, BLOOD (SINGLE)

## 2014-05-30 ENCOUNTER — Ambulatory Visit (INDEPENDENT_AMBULATORY_CARE_PROVIDER_SITE_OTHER): Payer: BC Managed Care – PPO | Admitting: Internal Medicine

## 2014-05-30 ENCOUNTER — Encounter: Payer: Self-pay | Admitting: Internal Medicine

## 2014-05-30 VITALS — BP 118/80 | HR 93 | Temp 98.3°F | Ht 66.5 in | Wt 193.5 lb

## 2014-05-30 DIAGNOSIS — R1084 Generalized abdominal pain: Secondary | ICD-10-CM

## 2014-05-30 DIAGNOSIS — R519 Headache, unspecified: Secondary | ICD-10-CM

## 2014-05-30 DIAGNOSIS — E1040 Type 1 diabetes mellitus with diabetic neuropathy, unspecified: Secondary | ICD-10-CM

## 2014-05-30 DIAGNOSIS — K219 Gastro-esophageal reflux disease without esophagitis: Secondary | ICD-10-CM

## 2014-05-30 DIAGNOSIS — R51 Headache: Secondary | ICD-10-CM

## 2014-05-30 DIAGNOSIS — G629 Polyneuropathy, unspecified: Secondary | ICD-10-CM

## 2014-05-30 DIAGNOSIS — E7800 Pure hypercholesterolemia, unspecified: Secondary | ICD-10-CM

## 2014-05-30 DIAGNOSIS — G4733 Obstructive sleep apnea (adult) (pediatric): Secondary | ICD-10-CM

## 2014-05-30 DIAGNOSIS — E039 Hypothyroidism, unspecified: Secondary | ICD-10-CM

## 2014-05-30 NOTE — Progress Notes (Signed)
Pre visit review using our clinic review tool, if applicable. No additional management support is needed unless otherwise documented below in the visit note. 

## 2014-06-08 ENCOUNTER — Encounter: Payer: Self-pay | Admitting: Internal Medicine

## 2014-06-08 NOTE — Progress Notes (Signed)
Subjective:    Patient ID: Eric Hayden, male    DOB: [birth year], 38 y.o.   MRN: [id number]  HPI 38 year old male with past history of Type I diabetes, hypothyroidism and hypercholesterolemia who comes in today to follow up on these issues as well as for a complete physical exam.  Sees Dr Elisabeth Most for his diabetes.   He has a history of neuropathic chest pain.  Takes Lyrica and this is better.   Had a stress echo recently that revealed no acute ischemic changes.  Excellent exercise tolerance for age.  No further pain like he was experiencing.  Not having issues with low blood sugars.  Overall feels he is doing well.  Feels better.  Recently evaluated for headaches, dizziness and abdominal pain.  See last note for details.  Prednisone eased headache.  Saw neurology.  See Dr Trena Platt note for details.  Was given robaxin and metntax.  Not taking.  Feels back to normal.  No headache or dizziness.  Eating and drinking well.  Sugars coming back down from being on prednisone.  No abdominal pain or cramping.  Off reglan.  Bowels stable.     Past Medical History  Diagnosis Date  . Asthma   . Cancer   . Chicken pox   . Type 1 diabetes mellitus   . GERD (gastroesophageal reflux disease)   . Hx of oral aphthous ulcers   . Heart murmur   . Migraines   . Hypothyroidism     Outpatient Encounter Prescriptions as of 05/30/2014  Medication Sig  . amitriptyline (ELAVIL) 25 MG tablet TAKE 1 TABLET BY MOUTH DAILY  . insulin lispro (HUMALOG) 100 UNIT/ML injection As directed.  Dispense 4 vials.  Marland Kitchen levothyroxine (SYNTHROID, LEVOTHROID) 112 MCG tablet Take 112 mcg by mouth daily before breakfast.  . lisinopril (PRINIVIL,ZESTRIL) 10 MG tablet Take 1 tablet (10 mg total) by mouth daily.  Marland Kitchen LYRICA 75 MG capsule TAKE 1 CAPSULE TWICE DAILY  . omeprazole (PRILOSEC) 20 MG capsule TAKE 1 CAPSULE EVERY MORNING  . ranitidine (ZANTAC) 150 MG tablet Take 150 mg by mouth at bedtime.  . [DISCONTINUED] levothyroxine  (SYNTHROID, LEVOTHROID) 100 MCG tablet TAKE 1 TABLET BY MOUTH DAILY BEFORE BREAKFAST  . [DISCONTINUED] metoCLOPramide (REGLAN) 10 MG tablet Take 10 mg by mouth every 8 (eight) hours as needed for nausea.  . [DISCONTINUED] predniSONE (DELTASONE) 5 MG tablet Take 6 tablets x 1 day and then decrease by 1 tablet per day until down to zero mg.    Review of Systems Patient denies any headache, lightheadedness or dizziness.  No sinus or allergy symptoms.  No further chest pain as he was experiencing previously.  No palpitations.  No increased shortness of breath, cough or congestion.  No nausea or vomiting.  Acid reflux controlled.  No abdominal pain or cramping.  No bowel change, such as diarrhea, constipation, BRBPR or melana.  No urine change.  Feels he is handling stress relatively well.  Feels back to his baseline.       Objective:   Physical Exam  Filed Vitals:   05/30/14 0926  BP: 118/80  Pulse: 93  Temp: 98.3 F (36.8 C)   Blood pressure recheck:  11/75  38 year old male in no acute distress.  HEENT:  Nares - clear.  Oropharynx - without lesions. NECK:  Supple.  Nontender.  No audible carotid bruit.  HEART:  Appears to be regular.   LUNGS:  No crackles or wheezing audible.  Respirations even and unlabored.   RADIAL PULSE:  Equal bilaterally.  ABDOMEN:  Soft.  Nontender.  Bowel sounds present and normal.  No audible abdominal bruit.  GU:  Normal descended testicles.  No palpable testicular nodules.  EXTREMITIES:  No increased edema present.  DP pulses palpable and equal bilaterally.     FEET:  No lesions.          Assessment & Plan:  CARDIOVASCULAR.  Recent chest pain as outlined.  Stress test negative.  See official report for details.  Currently without symptoms.  Follow.    INCREASED PSYCHOSOCIAL STRESSORS.  Stress better.  Has good support.  Desires no further intervention.  Follow.   Obstructive sleep apnea Using CPAP.   Gastroesophageal reflux disease, esophagitis  presence not specified On omeprazole.  Controlled.   Type 1 diabetes mellitus with diabetic neuropathy Sugars coming back down after being off prednisone.  Sees endocrinology.  Up to date with eye exams.    Hypothyroidism, unspecified hypothyroidism type On synthroid.  Follow tsh.  Lab Results  Component Value Date   TSH 5.11 04/06/2014   Neuropathy Controlled on lyrica.  Dr Manuella Ghazi discussed metanx.    Hypercholesterolemia Low cholesterol diet and exercise.  Follow lipid panel.  Lab Results  Component Value Date   CHOL 134 04/06/2014   HDL 25.50* 11/22/2012   LDLCALC 86 04/06/2014   TRIG 76.0 11/22/2012   CHOLHDL 5 11/22/2012   Acute nonintractable headache, unspecified headache type Headache resolved.  Saw Dr Manuella Ghazi.  Took the prednisone.  See Dr Trena Platt note for details.    Generalized abdominal pain Resolved.    HEALTH MAINTENANCE.  Physical today.

## 2014-06-17 ENCOUNTER — Other Ambulatory Visit: Payer: Self-pay | Admitting: Internal Medicine

## 2014-06-24 ENCOUNTER — Other Ambulatory Visit: Payer: Self-pay | Admitting: Internal Medicine

## 2014-06-25 NOTE — Telephone Encounter (Signed)
Refilled lyrica #60 with 2 refills.

## 2014-06-25 NOTE — Telephone Encounter (Signed)
Rx faxed

## 2014-06-25 NOTE — Telephone Encounter (Signed)
Last refill 10.26.15, last OV 10.29.15.  Please advise refill

## 2014-07-05 ENCOUNTER — Other Ambulatory Visit: Payer: Self-pay | Admitting: Internal Medicine

## 2014-07-30 ENCOUNTER — Ambulatory Visit: Payer: BC Managed Care – PPO | Admitting: Internal Medicine

## 2014-08-26 ENCOUNTER — Other Ambulatory Visit: Payer: Self-pay | Admitting: Internal Medicine

## 2014-08-26 NOTE — Telephone Encounter (Signed)
ok'd refill lyrica #60 with 2 refills.

## 2014-08-26 NOTE — Telephone Encounter (Signed)
Called to pharmacy 

## 2014-08-26 NOTE — Telephone Encounter (Signed)
Ok refill? 

## 2014-08-28 LAB — HM DIABETES EYE EXAM

## 2014-10-07 ENCOUNTER — Encounter: Payer: Self-pay | Admitting: Internal Medicine

## 2014-11-24 NOTE — Op Note (Signed)
PATIENT NAME:  Eric Hayden, Eric Hayden MR#:  [id number] DATE OF BIRTH:  1976-06-23  DATE OF PROCEDURE:  07/23/2011  PREOPERATIVE DIAGNOSIS: Acute appendicitis.   POSTOPERATIVE DIAGNOSIS: Acute appendicitis.   PROCEDURE: Laparoscopic appendectomy.   SURGEON: Rochel Brome, MD  ANESTHESIA: General.   INDICATION: This 39 year old male came in with a three-day history of right lower quadrant pain which is increasing in severity. He had right lower quadrant tenderness with guarding, normal white count. There was no evidence of appendicitis on CT scan, but with his increasing pain and pain aggravated by movement and point tenderness surgery was recommended for definitive treatment.   DESCRIPTION OF PROCEDURE: The patient was placed on the operating table in the supine position under general endotracheal anesthesia. The abdomen was prepared with ChloraPrep and draped in a sterile manner.   A short incision was made in the inferior aspect of the umbilicus and carried down to the deep fascia which was grasped with laryngeal hook and elevated. A Veress needle was inserted, aspirated, and irrigated with a saline solution. Next, the peritoneal cavity was inflated with carbon dioxide. The Veress needle was removed. The 10 mm cannula was inserted. The 10 mm, 0 degree laparoscope was inserted to view the peritoneal cavity. The patient was placed in Trendelenburg position. You could just see omentum at this point, but with further insufflation the next incision was made in the left lower quadrant to introduce a 12 mm cannula lateral to the inferior epigastric vessels. Another incision was made in the right lower quadrant to insert a 5 mm cannula lateral to the inferior epigastric vessels. Next, a portion of omentum was retracted. The patient was tilted towards the left and the cecum was identified. There were a number of adhesions between the cecum and the anterior abdominal wall which were taken down with blunt and sharp  dissection. Next, traction was applied as the cecum was elevated with a Babcock clamp demonstrating the terminal ileum, which appeared normal. The appendix was identified and found to have moderate dilatation in the midportion of the appendix. There were no gangrenous changes, but it appeared that he likely had an appendicolith with the dilatation. Next, a window was created in the mesoappendix adjacent to the base of the appendix. The blue cartridge Endo GIA stapler was placed across the appendix adjacent to the cecum, engaged, and activated separating the appendix from the cecum. The staple line was hemostatic. Next, the mesoappendix was divided with three loads of the white cartridge Endo GIA stapler and several small bleeding points along the staple line were cauterized. A small amount of blood was aspirated. Hemostasis was subsequently intact. The appendix was delivered up through the 12 mm port site with an Endo Catch bag. It was examined. It did appear to be dilated at its midportion containing fluid within the appendix suspicious for early appendicitis. Next, a 12 mm cannula was reinserted and the right lower quadrant was further examined. Hemostasis was intact. It is further noted the terminal ileum appeared normal. There was no Meckel's diverticulum seen. There was no adenopathy appreciated and no other signs of peritonitis. Next, the cannulas were removed allowing carbon dioxide to escape from the peritoneal cavity. The skin incisions were closed with interrupted 5-0 chromic subcuticular sutures, benzoin, and Steri-Strips. Dressings were applied with paper tape. The patient tolerated surgery satisfactorily and was then prepared for transfer to the recovery room. ____________________________ Lenna Sciara. Rochel Brome, MD jws:[id number] D: 07/23/2011 18:34:57 ET T: 07/24/2011 08:40:53 ET JOB#: [id number]  cc:  Loreli Dollar, MD, <Dictator> Loreli Dollar MD ELECTRONICALLY SIGNED 08/15/2011 9:05

## 2014-11-25 ENCOUNTER — Other Ambulatory Visit: Payer: Self-pay | Admitting: Internal Medicine

## 2014-11-25 NOTE — Telephone Encounter (Signed)
Last visit 05/30/14, has appt sch 12/05/14. Ok refill?

## 2014-11-25 NOTE — Telephone Encounter (Signed)
Refilled lyrica #60 with 5 refills.

## 2014-11-25 NOTE — Telephone Encounter (Signed)
Faxed to pharmacy

## 2014-11-28 ENCOUNTER — Ambulatory Visit: Payer: BC Managed Care – PPO | Admitting: Internal Medicine

## 2014-12-05 ENCOUNTER — Ambulatory Visit (INDEPENDENT_AMBULATORY_CARE_PROVIDER_SITE_OTHER): Payer: BLUE CROSS/BLUE SHIELD | Admitting: Internal Medicine

## 2014-12-05 ENCOUNTER — Encounter: Payer: Self-pay | Admitting: Internal Medicine

## 2014-12-05 VITALS — BP 126/80 | HR 89 | Temp 98.2°F | Ht 66.5 in | Wt 205.1 lb

## 2014-12-05 DIAGNOSIS — E7800 Pure hypercholesterolemia, unspecified: Secondary | ICD-10-CM

## 2014-12-05 DIAGNOSIS — K219 Gastro-esophageal reflux disease without esophagitis: Secondary | ICD-10-CM

## 2014-12-05 DIAGNOSIS — G629 Polyneuropathy, unspecified: Secondary | ICD-10-CM

## 2014-12-05 DIAGNOSIS — G4733 Obstructive sleep apnea (adult) (pediatric): Secondary | ICD-10-CM

## 2014-12-05 DIAGNOSIS — R079 Chest pain, unspecified: Secondary | ICD-10-CM | POA: Diagnosis not present

## 2014-12-05 DIAGNOSIS — E039 Hypothyroidism, unspecified: Secondary | ICD-10-CM

## 2014-12-05 DIAGNOSIS — E1040 Type 1 diabetes mellitus with diabetic neuropathy, unspecified: Secondary | ICD-10-CM

## 2014-12-05 DIAGNOSIS — Z0000 Encounter for general adult medical examination without abnormal findings: Secondary | ICD-10-CM

## 2014-12-05 NOTE — Progress Notes (Signed)
Pre visit review using our clinic review tool, if applicable. No additional management support is needed unless otherwise documented below in the visit note. 

## 2014-12-05 NOTE — Progress Notes (Signed)
Patient ID: Eric Hayden, male   DOB: Aug 03, 1975, 39 y.o.   MRN: [id number]   Subjective:    Patient ID: Eric Hayden, male    DOB: 1975/08/16, 39 y.o.   MRN: [id number]  HPI  Patient here for a scheduled follow up.   Has noticed over the last two days - pain in anterior chest that extends into his neck/shoulder.  Worsens with head movement.  Worsens with movement of his shoulder.  Some pain with palpation.  No sob.  Neuropathy pain stable.  Eating and drinking well.  No nausea or vomiting.  No bowel change.  No light headedness or dizziness.  Sees endocrinology.  Has insulin pump.  Sugars - ok.  Brought in no recorded readings.     Past Medical History  Diagnosis Date  . Asthma   . Cancer   . Chicken pox   . Type 1 diabetes mellitus   . GERD (gastroesophageal reflux disease)   . Hx of oral aphthous ulcers   . Heart murmur   . Migraines   . Hypothyroidism     Current Outpatient Prescriptions on File Prior to Visit  Medication Sig Dispense Refill  . amitriptyline (ELAVIL) 25 MG tablet TAKE ONE TABLET BY MOUTH EVERY DAY 30 tablet 2  . atorvastatin (LIPITOR) 10 MG tablet TAKE ONE TABLET BY MOUTH EVERY DAY 30 tablet 6  . insulin lispro (HUMALOG) 100 UNIT/ML injection As directed.  Dispense 4 vials. 10 mL 5  . levothyroxine (SYNTHROID, LEVOTHROID) 112 MCG tablet Take 112 mcg by mouth daily before breakfast.    . LYRICA 75 MG capsule TAKE 1 CAPSULE BY MOUTH TWICE DAILY 60 capsule 5  . omeprazole (PRILOSEC) 20 MG capsule TAKE 1 CAPSULE BY MOUTH EVERY MORNING 30 capsule 6  . ranitidine (ZANTAC) 150 MG tablet Take 150 mg by mouth at bedtime.     No current facility-administered medications on file prior to visit.    Review of Systems  Constitutional: Negative for appetite change and unexpected weight change.  HENT: Negative for congestion and sinus pressure.   Respiratory: Negative for cough, chest tightness and shortness of breath.   Cardiovascular: Positive for chest pain. Negative  for palpitations and leg swelling.  Gastrointestinal: Negative for nausea, vomiting, abdominal pain and diarrhea.  Musculoskeletal:       Increased neck and shoulder pain with movement.  Aggravates the chest pain.    Skin: Negative for color change and rash.  Neurological: Negative for dizziness, light-headedness and headaches.  Psychiatric/Behavioral: Negative for dysphoric mood and agitation.       Objective:    Physical Exam  Constitutional: He appears well-developed and well-nourished. No distress.  HENT:  Nose: Nose normal.  Mouth/Throat: Oropharynx is clear and moist.  Neck: Neck supple. No thyromegaly present.  Cardiovascular: Normal rate and regular rhythm.   Pulmonary/Chest: Effort normal and breath sounds normal. No respiratory distress.  Abdominal: Soft. Bowel sounds are normal. There is no tenderness.  Musculoskeletal: He exhibits no edema.  Increased pain to palpation - left anterior chest.  Increased pain with rotation of head and movement of left shoulder/arm.    Lymphadenopathy:    He has no cervical adenopathy.  Skin: No rash noted. No erythema.  Psychiatric: He has a normal mood and affect. His behavior is normal.    BP 126/80 mmHg  Pulse 89  Temp(Src) 98.2 F (36.8 C) (Oral)  Ht 5' 6.5" (1.689 m)  Wt 205 lb 2 oz (93.044 kg)  BMI  32.62 kg/m2  SpO2 97% Wt Readings from Last 3 Encounters:  12/05/14 205 lb 2 oz (93.044 kg)  05/30/14 193 lb 8 oz (87.771 kg)  05/20/14 193 lb 8 oz (87.771 kg)     Lab Results  Component Value Date   WBC 9.3 05/18/2014   HGB 14.2 05/18/2014   HCT 44.5 05/18/2014   PLT 248 05/18/2014   GLUCOSE 153* 05/18/2014   CHOL 134 04/06/2014   TRIG 76.0 11/22/2012   HDL 25.50* 11/22/2012   LDLCALC 86 04/06/2014   ALT 40 05/18/2014   AST 37 05/18/2014   NA 139 05/18/2014   K 4.5 05/18/2014   CL 106 05/18/2014   CREATININE 1.10 05/18/2014   BUN 12 05/18/2014   CO2 26 05/18/2014   TSH 5.11 04/06/2014   HGBA1C 7.3* 04/06/2014    MICROALBUR 0.6 11/22/2012       Assessment & Plan:   Problem List Items Addressed This Visit    Chest pain - Primary    Chest pain as outlined.  EKG obtained and revealed SR with no acute ischemic changes.  Pain is reproducible on exam with palpation and with rotation of his head and shoulder/arm.  Tylenol and gentle use of advil.  Hold on further cardiac w/up.  Follow.        Relevant Orders   EKG 12-Lead (Completed)   GERD (gastroesophageal reflux disease)    On omeprazole and zantac.  No upper symptoms reported.        Health care maintenance    Physical 05/30/14.        Hypercholesterolemia    Low cholesterol diet and exercise.  On lipitor.  Follow lipid panel and liver function tests.       Relevant Orders   Lipid panel   Hepatic function panel   Hypothyroidism    On thyroid replacement.  Follow tsh.       Relevant Orders   TSH   Neuropathy    Neuropathic chest pain - stable.  On gabapentin.        Obstructive sleep apnea    Uses CPAP.       Type 1 diabetes mellitus    Followed by endocrinology.  States sugars ok.        Relevant Orders   Hemoglobin O1B   Basic metabolic panel     I spent 25 minutes with the patient and more than 50% of the time was spent in consultation regarding the above.     Einar Pheasant, MD

## 2014-12-09 ENCOUNTER — Encounter: Payer: Self-pay | Admitting: Internal Medicine

## 2014-12-09 DIAGNOSIS — Z0000 Encounter for general adult medical examination without abnormal findings: Secondary | ICD-10-CM | POA: Insufficient documentation

## 2014-12-09 DIAGNOSIS — R079 Chest pain, unspecified: Secondary | ICD-10-CM | POA: Insufficient documentation

## 2014-12-09 NOTE — Assessment & Plan Note (Signed)
Followed by endocrinology.  States sugars ok.

## 2014-12-09 NOTE — Assessment & Plan Note (Signed)
On omeprazole and zantac.  No upper symptoms reported.

## 2014-12-09 NOTE — Assessment & Plan Note (Signed)
On thyroid replacement.  Follow tsh.  

## 2014-12-09 NOTE — Assessment & Plan Note (Signed)
Low cholesterol diet and exercise.  On lipitor.  Follow lipid panel and liver function tests.   

## 2014-12-09 NOTE — Assessment & Plan Note (Signed)
Neuropathic chest pain - stable.  On gabapentin.

## 2014-12-09 NOTE — Assessment & Plan Note (Signed)
Chest pain as outlined.  EKG obtained and revealed SR with no acute ischemic changes.  Pain is reproducible on exam with palpation and with rotation of his head and shoulder/arm.  Tylenol and gentle use of advil.  Hold on further cardiac w/up.  Follow.

## 2014-12-09 NOTE — Assessment & Plan Note (Signed)
Physical 05/30/14.

## 2014-12-09 NOTE — Assessment & Plan Note (Signed)
Uses CPAP 

## 2014-12-20 ENCOUNTER — Telehealth: Payer: Self-pay | Admitting: *Deleted

## 2014-12-20 ENCOUNTER — Other Ambulatory Visit (INDEPENDENT_AMBULATORY_CARE_PROVIDER_SITE_OTHER): Payer: BLUE CROSS/BLUE SHIELD

## 2014-12-20 DIAGNOSIS — E039 Hypothyroidism, unspecified: Secondary | ICD-10-CM | POA: Diagnosis not present

## 2014-12-20 DIAGNOSIS — E1040 Type 1 diabetes mellitus with diabetic neuropathy, unspecified: Secondary | ICD-10-CM

## 2014-12-20 DIAGNOSIS — E7800 Pure hypercholesterolemia, unspecified: Secondary | ICD-10-CM

## 2014-12-20 LAB — BASIC METABOLIC PANEL
BUN: 15 mg/dL (ref 6–23)
CHLORIDE: 105 meq/L (ref 96–112)
CO2: 28 mEq/L (ref 19–32)
Calcium: 10 mg/dL (ref 8.4–10.5)
Creatinine, Ser: 1.04 mg/dL (ref 0.40–1.50)
GFR: 84.86 mL/min (ref >60.00)
Glucose, Bld: 34 mg/dL — CL (ref 70–99)
POTASSIUM: 4.4 meq/L (ref 3.5–5.1)
SODIUM: 142 meq/L (ref 135–145)

## 2014-12-20 LAB — LIPID PANEL
CHOL/HDL RATIO: 4
Cholesterol: 126 mg/dL (ref 0–200)
HDL: 33.5 mg/dL — ABNORMAL LOW (ref >39.00)
LDL Cholesterol: 72 mg/dL (ref 0–99)
NONHDL: 92.5
TRIGLYCERIDES: 104 mg/dL (ref 0.0–149.0)
VLDL: 20.8 mg/dL (ref 0.0–40.0)

## 2014-12-20 LAB — HEPATIC FUNCTION PANEL
ALT: 22 U/L (ref 0–53)
AST: 18 U/L (ref 0–37)
Albumin: 4.3 g/dL (ref 3.5–5.2)
Alkaline Phosphatase: 132 U/L — ABNORMAL HIGH (ref 39–117)
Bilirubin, Direct: 0.1 mg/dL (ref 0.0–0.3)
Total Bilirubin: 0.4 mg/dL (ref 0.2–1.2)
Total Protein: 7.6 g/dL (ref 6.0–8.3)

## 2014-12-20 LAB — TSH: TSH: 0.59 u[IU]/mL (ref 0.35–4.50)

## 2014-12-20 LAB — HEMOGLOBIN A1C: HEMOGLOBIN A1C: 7.6 % — AB (ref 4.6–6.5)

## 2014-12-20 NOTE — Telephone Encounter (Signed)
Noted.  Thanks.  Let us know if any problems.

## 2014-12-20 NOTE — Telephone Encounter (Signed)
The low blood sugar on labs may be related to the blood sitting in the tube, however he is on Insulin for diabetes. Can you please ask him to email or call with some of his recent blood sugar readings? He should monitor for any low BG <70 and let us know if this occurs.

## 2014-12-20 NOTE — Telephone Encounter (Signed)
Dr.walker asked me to called pt to see how he was doing, he said he was doing fine

## 2014-12-20 NOTE — Telephone Encounter (Signed)
Spoke with pt, states he is doing well. States he checks his blood sugars regularly, they have not been below 70. Advised to notify us of any low readings and will also follow up with his endocrinologist.

## 2014-12-20 NOTE — Telephone Encounter (Signed)
Glucose, Bld: 34

## 2014-12-22 ENCOUNTER — Other Ambulatory Visit: Payer: Self-pay | Admitting: Internal Medicine

## 2014-12-22 DIAGNOSIS — R748 Abnormal levels of other serum enzymes: Secondary | ICD-10-CM

## 2014-12-22 NOTE — Progress Notes (Signed)
Order placed for elevated alkaline phosphatase.

## 2014-12-23 ENCOUNTER — Encounter: Payer: Self-pay | Admitting: *Deleted

## 2015-01-13 ENCOUNTER — Other Ambulatory Visit: Payer: Self-pay | Admitting: Internal Medicine

## 2015-01-24 ENCOUNTER — Other Ambulatory Visit (INDEPENDENT_AMBULATORY_CARE_PROVIDER_SITE_OTHER): Payer: BLUE CROSS/BLUE SHIELD

## 2015-01-24 DIAGNOSIS — R748 Abnormal levels of other serum enzymes: Secondary | ICD-10-CM | POA: Diagnosis not present

## 2015-01-24 LAB — ALKALINE PHOSPHATASE: Alkaline Phosphatase: 123 U/L — ABNORMAL HIGH (ref 39–117)

## 2015-01-27 ENCOUNTER — Other Ambulatory Visit: Payer: Self-pay | Admitting: Internal Medicine

## 2015-01-27 ENCOUNTER — Encounter: Payer: Self-pay | Admitting: *Deleted

## 2015-01-27 DIAGNOSIS — R748 Abnormal levels of other serum enzymes: Secondary | ICD-10-CM

## 2015-01-27 NOTE — Progress Notes (Signed)
Order placed for alkaline phos isoenzymes.

## 2015-02-26 ENCOUNTER — Other Ambulatory Visit: Payer: BLUE CROSS/BLUE SHIELD

## 2015-03-03 ENCOUNTER — Other Ambulatory Visit (INDEPENDENT_AMBULATORY_CARE_PROVIDER_SITE_OTHER): Payer: BLUE CROSS/BLUE SHIELD

## 2015-03-03 DIAGNOSIS — R748 Abnormal levels of other serum enzymes: Secondary | ICD-10-CM | POA: Diagnosis not present

## 2015-03-05 LAB — ALKALINE PHOSPHATASE, ISOENZYMES
Alkaline Phosphatase: 129 IU/L — ABNORMAL HIGH (ref 39–117)
BONE FRACTION: 32 % (ref 12–68)
INTESTINAL FRAC.: 0 % (ref 0–18)
LIVER FRACTION: 68 % (ref 13–88)

## 2015-03-10 ENCOUNTER — Encounter: Payer: Self-pay | Admitting: Internal Medicine

## 2015-03-10 ENCOUNTER — Other Ambulatory Visit: Payer: Self-pay | Admitting: Internal Medicine

## 2015-03-10 DIAGNOSIS — R748 Abnormal levels of other serum enzymes: Secondary | ICD-10-CM

## 2015-03-10 NOTE — Progress Notes (Signed)
Order placed for abdominal ultrasound.

## 2015-03-13 ENCOUNTER — Ambulatory Visit: Payer: BLUE CROSS/BLUE SHIELD

## 2015-03-17 ENCOUNTER — Encounter: Payer: Self-pay | Admitting: Internal Medicine

## 2015-03-17 ENCOUNTER — Ambulatory Visit
Admission: RE | Admit: 2015-03-17 | Discharge: 2015-03-17 | Disposition: A | Discharge status: Home / Self Care | Payer: BLUE CROSS/BLUE SHIELD | Source: Ambulatory Visit | Attending: Internal Medicine | Admitting: Internal Medicine

## 2015-03-17 DIAGNOSIS — R748 Abnormal levels of other serum enzymes: Secondary | ICD-10-CM

## 2015-03-18 NOTE — Telephone Encounter (Signed)
Order placed for GI referral.   

## 2015-05-23 ENCOUNTER — Other Ambulatory Visit: Payer: Self-pay | Admitting: Internal Medicine

## 2015-05-23 NOTE — Telephone Encounter (Signed)
Last OV 5.5.16, next OV 11.10.16. Please advise refill

## 2015-05-23 NOTE — Telephone Encounter (Signed)
Refilled lyrica #[phone] with 3 refills.

## 2015-06-12 ENCOUNTER — Encounter: Payer: BLUE CROSS/BLUE SHIELD | Admitting: Internal Medicine

## 2015-07-10 ENCOUNTER — Other Ambulatory Visit: Payer: Self-pay | Admitting: Internal Medicine

## 2015-07-22 ENCOUNTER — Encounter: Payer: Self-pay | Admitting: Internal Medicine

## 2015-07-22 DIAGNOSIS — E1040 Type 1 diabetes mellitus with diabetic neuropathy, unspecified: Secondary | ICD-10-CM

## 2015-07-22 DIAGNOSIS — E039 Hypothyroidism, unspecified: Secondary | ICD-10-CM

## 2015-07-23 NOTE — Telephone Encounter (Signed)
Order placed for labs.

## 2015-07-25 ENCOUNTER — Other Ambulatory Visit (INDEPENDENT_AMBULATORY_CARE_PROVIDER_SITE_OTHER): Payer: BLUE CROSS/BLUE SHIELD

## 2015-07-25 DIAGNOSIS — E039 Hypothyroidism, unspecified: Secondary | ICD-10-CM

## 2015-07-25 DIAGNOSIS — E1040 Type 1 diabetes mellitus with diabetic neuropathy, unspecified: Secondary | ICD-10-CM

## 2015-07-25 LAB — BASIC METABOLIC PANEL
BUN: 18 mg/dL (ref 6–23)
CALCIUM: 9.7 mg/dL (ref 8.4–10.5)
CO2: 31 meq/L (ref 19–32)
Chloride: 103 mEq/L (ref 96–112)
Creatinine, Ser: 1.09 mg/dL (ref 0.40–1.50)
GFR: 80.13 mL/min (ref >60.00)
GLUCOSE: 187 mg/dL — AB (ref 70–99)
Potassium: 4.2 mEq/L (ref 3.5–5.1)
SODIUM: 139 meq/L (ref 135–145)

## 2015-07-25 LAB — MICROALBUMIN / CREATININE URINE RATIO
Creatinine,U: 156.7 mg/dL
MICROALB/CREAT RATIO: 0.4 mg/g (ref 0.0–30.0)
Microalb, Ur: <0.7 mg/dL (ref 0.0–1.9)

## 2015-07-25 LAB — CBC WITH DIFFERENTIAL/PLATELET
BASOS ABS: 0 10*3/uL (ref 0.0–0.1)
Basophils Relative: 0.2 % (ref 0.0–3.0)
Eosinophils Absolute: 0.5 10*3/uL (ref 0.0–0.7)
Eosinophils Relative: 5.1 % — ABNORMAL HIGH (ref 0.0–5.0)
HEMATOCRIT: 43.7 % (ref 39.0–52.0)
Hemoglobin: 14.4 g/dL (ref 13.0–17.0)
LYMPHS PCT: 28.7 % (ref 12.0–46.0)
Lymphs Abs: 2.8 10*3/uL (ref 0.7–4.0)
MCHC: 33 g/dL (ref 30.0–36.0)
MCV: 83.2 fl (ref 78.0–100.0)
MONOS PCT: 5.5 % (ref 3.0–12.0)
Monocytes Absolute: 0.5 10*3/uL (ref 0.1–1.0)
Neutro Abs: 5.8 10*3/uL (ref 1.4–7.7)
Neutrophils Relative %: 60.5 % (ref 43.0–77.0)
Platelets: 287 10*3/uL (ref 150.0–400.0)
RBC: 5.25 Mil/uL (ref 4.22–5.81)
RDW: 13.7 % (ref 11.5–15.5)
WBC: 9.6 10*3/uL (ref 4.0–10.5)

## 2015-07-25 LAB — HEPATIC FUNCTION PANEL
ALK PHOS: 128 U/L — AB (ref 39–117)
ALT: 18 U/L (ref 0–53)
AST: 15 U/L (ref 0–37)
Albumin: 4.1 g/dL (ref 3.5–5.2)
BILIRUBIN DIRECT: 0.1 mg/dL (ref 0.0–0.3)
Total Bilirubin: 0.5 mg/dL (ref 0.2–1.2)
Total Protein: 7.3 g/dL (ref 6.0–8.3)

## 2015-07-25 LAB — LIPID PANEL
CHOL/HDL RATIO: 4
Cholesterol: 129 mg/dL (ref 0–200)
HDL: 33.4 mg/dL — AB (ref >39.00)
LDL CALC: 69 mg/dL (ref 0–99)
NonHDL: 95.64
TRIGLYCERIDES: 135 mg/dL (ref 0.0–149.0)
VLDL: 27 mg/dL (ref 0.0–40.0)

## 2015-07-25 LAB — TSH: TSH: 0.44 u[IU]/mL (ref 0.35–4.50)

## 2015-07-28 ENCOUNTER — Encounter: Payer: Self-pay | Admitting: Internal Medicine

## 2015-08-14 ENCOUNTER — Encounter: Payer: Self-pay | Admitting: Internal Medicine

## 2015-08-14 ENCOUNTER — Ambulatory Visit (INDEPENDENT_AMBULATORY_CARE_PROVIDER_SITE_OTHER): Payer: BLUE CROSS/BLUE SHIELD | Admitting: Internal Medicine

## 2015-08-14 VITALS — BP 122/70 | HR 106 | Temp 99.3°F | Resp 12 | Ht 67.0 in | Wt 198.4 lb

## 2015-08-14 DIAGNOSIS — Z23 Encounter for immunization: Secondary | ICD-10-CM | POA: Diagnosis not present

## 2015-08-14 DIAGNOSIS — E1040 Type 1 diabetes mellitus with diabetic neuropathy, unspecified: Secondary | ICD-10-CM | POA: Diagnosis not present

## 2015-08-14 DIAGNOSIS — K219 Gastro-esophageal reflux disease without esophagitis: Secondary | ICD-10-CM | POA: Diagnosis not present

## 2015-08-14 DIAGNOSIS — Z0000 Encounter for general adult medical examination without abnormal findings: Secondary | ICD-10-CM

## 2015-08-14 DIAGNOSIS — E039 Hypothyroidism, unspecified: Secondary | ICD-10-CM

## 2015-08-14 DIAGNOSIS — G629 Polyneuropathy, unspecified: Secondary | ICD-10-CM

## 2015-08-14 DIAGNOSIS — R748 Abnormal levels of other serum enzymes: Secondary | ICD-10-CM

## 2015-08-14 DIAGNOSIS — E7800 Pure hypercholesterolemia, unspecified: Secondary | ICD-10-CM

## 2015-08-14 NOTE — Progress Notes (Signed)
Pre visit review using our clinic review tool, if applicable. No additional management support is needed unless otherwise documented below in the visit note. 

## 2015-08-14 NOTE — Progress Notes (Signed)
Patient ID: Eric Hayden, male   DOB: 1975/11/19, 40 y.o.   MRN: [id number]   Subjective:    Patient ID: Eric Hayden, male    DOB: 09/05/1975, 40 y.o.   MRN: [id number]  HPI  Patient with past history of type I diabetes, GERD and hypothyroidism who comes in today to follow up on these issues as well as for a complete physical exam.  He recently had problems with bronchitis.  Treated with biaxin and an inhaler.  Better now.  No increased cough and congestion.  No sob.  No chest pain or tightness.  No acid reflux.  No abdominal pain or cramping.  Bowels stable.  a1c just checked 7.5.  Sees endocrinology.  No significant low sugars.  No headache, light headedness or dizziness.    Past Medical History  Diagnosis Date  . Asthma   . Cancer ([place])   . Chicken pox   . Type 1 diabetes mellitus ([place])   . GERD (gastroesophageal reflux disease)   . Hx of oral aphthous ulcers   . Heart murmur   . Migraines   . Hypothyroidism    Past Surgical History  Procedure Laterality Date  . Appendectomy  2012  . Tonsillectomy and adenoidectomy  2007   Family History  Problem Relation Age of Onset  . Arthritis Mother   . Hyperlipidemia Mother   . Hypertension Mother   . Diabetes Mother     type 2  . Hyperlipidemia Father   . Arthritis Maternal Grandmother   . Hyperlipidemia Maternal Grandmother   . Heart disease Maternal Grandmother   . Hypertension Maternal Grandmother   . Diabetes Maternal Grandmother     type 2  . Arthritis Maternal Grandfather   . Heart disease Maternal Grandfather   . Hypertension Maternal Grandfather   . Arthritis Paternal Grandmother   . Arthritis Paternal Grandfather    Social History   Social History  . Marital Status: Married    Spouse Name: N/A  . Number of Children: 2  . Years of Education: N/A   Social History Main Topics  . Smoking status: Never Smoker   . Smokeless tobacco: Never Used  . Alcohol Use: No  . Drug Use: No  . Sexual Activity: Not Asked    Other Topics Concern  . None   Social History Narrative    Outpatient Encounter Prescriptions as of 08/14/2015  Medication Sig  . amitriptyline (ELAVIL) 25 MG tablet TAKE ONE TABLET BY MOUTH EVERY DAY  . atorvastatin (LIPITOR) 10 MG tablet TAKE ONE TABLET BY MOUTH EVERY DAY  . insulin lispro (HUMALOG) 100 UNIT/ML injection As directed.  Dispense 4 vials.  Marland Kitchen levothyroxine (SYNTHROID, LEVOTHROID) 112 MCG tablet Take 112 mcg by mouth daily before breakfast.  . LYRICA 75 MG capsule TAKE 1 CAPSULE TWICE DAILY  . omeprazole (PRILOSEC) 20 MG capsule TAKE 1 CAPSULE BY MOUTH EVERY MORNING  . ranitidine (ZANTAC) 150 MG tablet Take 150 mg by mouth at bedtime.   No facility-administered encounter medications on file as of 08/14/2015.    Review of Systems  Constitutional: Negative for appetite change and unexpected weight change.  HENT: Negative for congestion and sinus pressure.   Eyes: Negative for pain and visual disturbance.  Respiratory: Negative for cough, chest tightness and shortness of breath.   Cardiovascular: Negative for chest pain, palpitations and leg swelling.  Gastrointestinal: Negative for nausea, vomiting, abdominal pain and diarrhea.  Genitourinary: Negative for dysuria and difficulty urinating.  Musculoskeletal: Negative for  back pain and joint swelling.  Skin: Negative for color change and rash.  Neurological: Negative for dizziness, light-headedness and headaches.  Hematological: Negative for adenopathy. Does not bruise/bleed easily.  Psychiatric/Behavioral: Negative for dysphoric mood and agitation.       Objective:    Physical Exam  Constitutional: He appears well-developed and well-nourished. No distress.  HENT:  Head: Normocephalic and atraumatic.  Nose: Nose normal.  Mouth/Throat: Oropharynx is clear and moist. No oropharyngeal exudate.  Eyes: Conjunctivae are normal. Right eye exhibits no discharge. Left eye exhibits no discharge.  Neck: Neck supple. No  thyromegaly present.  Cardiovascular: Normal rate and regular rhythm.   Pulmonary/Chest: Breath sounds normal. No respiratory distress. He has no wheezes.  Abdominal: Soft. Bowel sounds are normal. There is no tenderness.  Genitourinary:  No palpable testicular nodules.   Musculoskeletal: He exhibits no edema or tenderness.  Lymphadenopathy:    He has no cervical adenopathy.  Skin: Skin is warm and dry. No rash noted. No erythema.  Psychiatric: He has a normal mood and affect. His behavior is normal.    BP 122/70 mmHg  Pulse 106  Temp(Src) 99.3 F (37.4 C) (Oral)  Resp 12  Ht 5' 7"  (1.702 m)  Wt 198 lb 6.4 oz (89.994 kg)  BMI 31.07 kg/m2  SpO2 97% Wt Readings from Last 3 Encounters:  08/14/15 198 lb 6.4 oz (89.994 kg)  12/05/14 205 lb 2 oz (93.044 kg)  05/30/14 193 lb 8 oz (87.771 kg)     Lab Results  Component Value Date   WBC 9.6 07/25/2015   HGB 14.4 07/25/2015   HCT 43.7 07/25/2015   PLT 287.0 07/25/2015   GLUCOSE 187* 07/25/2015   CHOL 129 07/25/2015   TRIG 135.0 07/25/2015   HDL 33.40* 07/25/2015   LDLCALC 69 07/25/2015   ALT 18 07/25/2015   AST 15 07/25/2015   NA 139 07/25/2015   K 4.2 07/25/2015   CL 103 07/25/2015   CREATININE 1.09 07/25/2015   BUN 18 07/25/2015   CO2 31 07/25/2015   TSH 0.44 07/25/2015   HGBA1C 7.6* 12/20/2014   MICROALBUR <0.7 07/25/2015    US Abdomen Complete  03/17/2015  CLINICAL DATA:  Diabetes. EXAM: ULTRASOUND ABDOMEN COMPLETE COMPARISON:  None. FINDINGS: Gallbladder: No gallstones or wall thickening visualized. No sonographic Murphy sign noted. Common bile duct: Diameter: 3.8 mm Liver: No focal lesion identified. Within normal limits in parenchymal echogenicity. IVC: No abnormality visualized. Pancreas: Visualized portion unremarkable. Spleen: Size and appearance within normal limits. Right Kidney: Length: 12.1 cm. Echogenicity within normal limits. No mass or hydronephrosis visualized. Left Kidney: Length: 11.2 cm. Echogenicity  within normal limits. No mass or hydronephrosis visualized. Abdominal aorta: No aneurysm visualized. Other findings: None. IMPRESSION: Negative exam. Electronically Signed   By: Marcello Moores  Register   On: 03/17/2015 08:27       Assessment & Plan:   Problem List Items Addressed This Visit    Elevated alkaline phosphatase level    Persistent elevation.  Abdominal ultrasound reviewed and ok.  Saw GI.  Follow.  Currently asymptomatic.       Relevant Orders   Hepatic function panel   GERD (gastroesophageal reflux disease)    On omeprazole.  No acid reflux symptoms reported.  Follow.        Health care maintenance    Physical today as outlined - 08/14/15.        Hypercholesterolemia    On lipitor.  Low cholesterol diet and exercise.  Follow lipid panel  and liver function tests.        Relevant Orders   Lipid panel   Hypothyroidism    On thyroid replacement.  Follow tsh.       Relevant Orders   TSH   Neuropathy ([place])    Has neuropathic chest pain.  On lyrica.  Follow.  Stable.       Type 1 diabetes mellitus ([place])    Followed by endocrinology.  Has insulin pump.  a1c last checked 7.5.  Keep up to date with eye checks.  No significant problems with lows.  Discussed diet and exercise.        Relevant Orders   Basic metabolic panel   Hemoglobin A1c   Microalbumin / creatinine urine ratio    Other Visit Diagnoses    Need for prophylactic vaccination and inoculation against influenza    -  Primary        Einar Pheasant, MD

## 2015-08-17 ENCOUNTER — Encounter: Payer: Self-pay | Admitting: Internal Medicine

## 2015-08-17 DIAGNOSIS — R748 Abnormal levels of other serum enzymes: Secondary | ICD-10-CM | POA: Insufficient documentation

## 2015-08-17 NOTE — Assessment & Plan Note (Signed)
Physical today as outlined - 08/14/15.

## 2015-08-17 NOTE — Assessment & Plan Note (Signed)
Has neuropathic chest pain.  On lyrica.  Follow.  Stable.

## 2015-08-17 NOTE — Assessment & Plan Note (Signed)
On lipitor.  Low cholesterol diet and exercise.  Follow lipid panel and liver function tests.   

## 2015-08-17 NOTE — Assessment & Plan Note (Signed)
Followed by endocrinology.  Has insulin pump.  a1c last checked 7.5.  Keep up to date with eye checks.  No significant problems with lows.  Discussed diet and exercise.

## 2015-08-17 NOTE — Assessment & Plan Note (Signed)
On omeprazole.  No acid reflux symptoms reported.  Follow.

## 2015-08-17 NOTE — Assessment & Plan Note (Signed)
On thyroid replacement.  Follow tsh.  

## 2015-08-17 NOTE — Assessment & Plan Note (Signed)
Persistent elevation.  Abdominal ultrasound reviewed and ok.  Saw GI.  Follow.  Currently asymptomatic.

## 2015-08-18 ENCOUNTER — Encounter: Payer: Self-pay | Admitting: Internal Medicine

## 2015-08-18 NOTE — Telephone Encounter (Signed)
Spoke to pt. Will see him tomorrow at 12:15.  Pt ok with appt.

## 2015-08-19 ENCOUNTER — Ambulatory Visit
Admission: RE | Admit: 2015-08-19 | Discharge: 2015-08-19 | Disposition: A | Discharge status: Home / Self Care | Payer: BLUE CROSS/BLUE SHIELD | Source: Ambulatory Visit | Attending: Internal Medicine | Admitting: Internal Medicine

## 2015-08-19 ENCOUNTER — Encounter: Payer: Self-pay | Admitting: Internal Medicine

## 2015-08-19 ENCOUNTER — Other Ambulatory Visit: Payer: Self-pay | Admitting: Internal Medicine

## 2015-08-19 ENCOUNTER — Ambulatory Visit: Payer: BLUE CROSS/BLUE SHIELD | Admitting: Internal Medicine

## 2015-08-19 VITALS — BP 110/70 | HR 86 | Temp 98.0°F | Resp 18 | Ht 67.0 in | Wt 196.2 lb

## 2015-08-19 DIAGNOSIS — R109 Unspecified abdominal pain: Secondary | ICD-10-CM

## 2015-08-19 DIAGNOSIS — R1031 Right lower quadrant pain: Secondary | ICD-10-CM

## 2015-08-19 DIAGNOSIS — E1040 Type 1 diabetes mellitus with diabetic neuropathy, unspecified: Secondary | ICD-10-CM

## 2015-08-19 DIAGNOSIS — Z8509 Personal history of malignant neoplasm of other digestive organs: Secondary | ICD-10-CM | POA: Diagnosis present

## 2015-08-19 DIAGNOSIS — Z9049 Acquired absence of other specified parts of digestive tract: Secondary | ICD-10-CM | POA: Diagnosis not present

## 2015-08-19 LAB — CBC WITH DIFFERENTIAL/PLATELET
BASOS ABS: 0.1 10*3/uL (ref 0.0–0.1)
Basophils Relative: 0.6 % (ref 0.0–3.0)
EOS ABS: 0.4 10*3/uL (ref 0.0–0.7)
EOS PCT: 5 % (ref 0.0–5.0)
HCT: 44.2 % (ref 39.0–52.0)
HEMOGLOBIN: 14.3 g/dL (ref 13.0–17.0)
Lymphocytes Relative: 31.3 % (ref 12.0–46.0)
Lymphs Abs: 2.8 10*3/uL (ref 0.7–4.0)
MCHC: 32.3 g/dL (ref 30.0–36.0)
MCV: 83.1 fl (ref 78.0–100.0)
MONO ABS: 0.4 10*3/uL (ref 0.1–1.0)
Monocytes Relative: 4.9 % (ref 3.0–12.0)
Neutro Abs: 5.2 10*3/uL (ref 1.4–7.7)
Neutrophils Relative %: 58.2 % (ref 43.0–77.0)
Platelets: 276 10*3/uL (ref 150.0–400.0)
RBC: 5.31 Mil/uL (ref 4.22–5.81)
RDW: 14.1 % (ref 11.5–15.5)
WBC: 8.9 10*3/uL (ref 4.0–10.5)

## 2015-08-19 LAB — POCT URINALYSIS DIPSTICK
BILIRUBIN UA: NEGATIVE
Blood, UA: NEGATIVE
GLUCOSE UA: NEGATIVE
KETONES UA: NEGATIVE
LEUKOCYTES UA: NEGATIVE
NITRITE UA: NEGATIVE
Protein, UA: NEGATIVE
Spec Grav, UA: 1.02
Urobilinogen, UA: 0.2
pH, UA: 7

## 2015-08-19 LAB — BASIC METABOLIC PANEL
BUN: 13 mg/dL (ref 6–23)
CHLORIDE: 101 meq/L (ref 96–112)
CO2: 31 meq/L (ref 19–32)
Calcium: 9.6 mg/dL (ref 8.4–10.5)
Creatinine, Ser: 0.97 mg/dL (ref 0.40–1.50)
GFR: 91.64 mL/min (ref >60.00)
GLUCOSE: 205 mg/dL — AB (ref 70–99)
POTASSIUM: 4.5 meq/L (ref 3.5–5.1)
SODIUM: 140 meq/L (ref 135–145)

## 2015-08-19 MED ORDER — IOHEXOL 300 MG/ML  SOLN: 100.0000 mL | Freq: Once | INTRAMUSCULAR | Status: DC | PRN

## 2015-08-19 NOTE — Progress Notes (Signed)
Patient ID: Eric Hayden, male   DOB: Jul 06, 1976, 40 y.o.   MRN: [id number]   Subjective:    Patient ID: Eric Hayden, male    DOB: Feb 15, 1976, 40 y.o.   MRN: [id number]  HPI  Patient with past history of diabetes (Type I), neuropathy and hypothyroidism.  He comes in today as a work in with concerns regarding increased lower abdominal pain.  States pain started five days ago.  Localized in the RLQ.  Is eating.  No nausea or vomiting.  No bowel change.  No dysuria or hesitancy.  Noticed one day that the discomfort would minimally ease after urination.  This only occurred on a few occasions.  No change now with urination.  Sugars overall stable.  Earlier in the week, had been carrying big bags of cement.  No pain at the time of moving the bags.    Past Medical History  Diagnosis Date  . Asthma   . Cancer ([place])   . Chicken pox   . Type 1 diabetes mellitus ([place])   . GERD (gastroesophageal reflux disease)   . Hx of oral aphthous ulcers   . Heart murmur   . Migraines   . Hypothyroidism    Past Surgical History  Procedure Laterality Date  . Appendectomy  2012  . Tonsillectomy and adenoidectomy  2007   Family History  Problem Relation Age of Onset  . Arthritis Mother   . Hyperlipidemia Mother   . Hypertension Mother   . Diabetes Mother     type 2  . Hyperlipidemia Father   . Arthritis Maternal Grandmother   . Hyperlipidemia Maternal Grandmother   . Heart disease Maternal Grandmother   . Hypertension Maternal Grandmother   . Diabetes Maternal Grandmother     type 2  . Arthritis Maternal Grandfather   . Heart disease Maternal Grandfather   . Hypertension Maternal Grandfather   . Arthritis Paternal Grandmother   . Arthritis Paternal Grandfather    Social History   Social History  . Marital Status: Married    Spouse Name: N/A  . Number of Children: 2  . Years of Education: N/A   Social History Main Topics  . Smoking status: Never Smoker   . Smokeless tobacco: Never Used    . Alcohol Use: No  . Drug Use: No  . Sexual Activity: Not Asked   Other Topics Concern  . None   Social History Narrative    Outpatient Encounter Prescriptions as of 08/19/2015  Medication Sig  . amitriptyline (ELAVIL) 25 MG tablet TAKE ONE TABLET BY MOUTH EVERY DAY  . atorvastatin (LIPITOR) 10 MG tablet TAKE ONE TABLET BY MOUTH EVERY DAY  . insulin lispro (HUMALOG) 100 UNIT/ML injection As directed.  Dispense 4 vials.  Marland Kitchen levothyroxine (SYNTHROID, LEVOTHROID) 112 MCG tablet Take 112 mcg by mouth daily before breakfast.  . LYRICA 75 MG capsule TAKE 1 CAPSULE TWICE DAILY  . omeprazole (PRILOSEC) 20 MG capsule TAKE 1 CAPSULE BY MOUTH EVERY MORNING  . ranitidine (ZANTAC) 150 MG tablet Take 150 mg by mouth at bedtime.   No facility-administered encounter medications on file as of 08/19/2015.    Review of Systems  Constitutional: Negative for fever and appetite change.  HENT: Negative for congestion and sinus pressure.   Respiratory: Negative for cough, chest tightness and shortness of breath.   Cardiovascular: Negative for chest pain and leg swelling.  Gastrointestinal: Positive for abdominal pain (RLQ pain.  ). Negative for nausea, vomiting, diarrhea and constipation.  Genitourinary: Negative for dysuria and difficulty urinating.  Musculoskeletal: Negative for back pain.  Skin: Negative for rash.       Objective:    Physical Exam  Constitutional: He appears well-developed and well-nourished. No distress.  Neck: Neck supple.  Cardiovascular: Normal rate and regular rhythm.   Pulmonary/Chest: Effort normal and breath sounds normal. No respiratory distress.  Abdominal: Soft. Bowel sounds are normal.  Increased pain to palpation - RLQ.    Musculoskeletal: He exhibits no edema.  Back - non tender.    Lymphadenopathy:    He has no cervical adenopathy.    BP 110/70 mmHg  Pulse 86  Temp(Src) 98 F (36.7 C) (Oral)  Resp 18  Ht 5' 7"  (1.702 m)  Wt 196 lb 4 oz (89.018 kg)   BMI 30.73 kg/m2  SpO2 98% Wt Readings from Last 3 Encounters:  08/19/15 196 lb 4 oz (89.018 kg)  08/14/15 198 lb 6.4 oz (89.994 kg)  12/05/14 205 lb 2 oz (93.044 kg)     Lab Results  Component Value Date   WBC 8.9 08/19/2015   HGB 14.3 08/19/2015   HCT 44.2 08/19/2015   PLT 276.0 08/19/2015   GLUCOSE 205* 08/19/2015   CHOL 129 07/25/2015   TRIG 135.0 07/25/2015   HDL 33.40* 07/25/2015   LDLCALC 69 07/25/2015   ALT 18 07/25/2015   AST 15 07/25/2015   NA 140 08/19/2015   K 4.5 08/19/2015   CL 101 08/19/2015   CREATININE 0.97 08/19/2015   BUN 13 08/19/2015   CO2 31 08/19/2015   TSH 0.44 07/25/2015   HGBA1C 7.6* 12/20/2014   MICROALBUR <0.7 07/25/2015    US Abdomen Complete  03/17/2015  CLINICAL DATA:  Diabetes. EXAM: ULTRASOUND ABDOMEN COMPLETE COMPARISON:  None. FINDINGS: Gallbladder: No gallstones or wall thickening visualized. No sonographic Murphy sign noted. Common bile duct: Diameter: 3.8 mm Liver: No focal lesion identified. Within normal limits in parenchymal echogenicity. IVC: No abnormality visualized. Pancreas: Visualized portion unremarkable. Spleen: Size and appearance within normal limits. Right Kidney: Length: 12.1 cm. Echogenicity within normal limits. No mass or hydronephrosis visualized. Left Kidney: Length: 11.2 cm. Echogenicity within normal limits. No mass or hydronephrosis visualized. Abdominal aorta: No aneurysm visualized. Other findings: None. IMPRESSION: Negative exam. Electronically Signed   By: Marcello Moores  Register   On: 03/17/2015 08:27       Assessment & Plan:   Problem List Items Addressed This Visit    Abdominal pain - Primary    Increased RLQ pain.  Pain to palpation.  Eating .  No bowel change.  Check cbc, urinalysis and metabolic panel.  Check CT abdomen.  Has had previous appendectomy.  Tylenol/advil for pain.        Relevant Orders   CT Abdomen Pelvis W Contrast (Completed)   Urine culture (Completed)   CBC with Differential/Platelet  (Completed)   Basic metabolic panel (Completed)   POCT Urinalysis Dipstick (Completed)   CT Abdomen Pelvis W Contrast (Completed)   Urine culture (Completed)   Type 1 diabetes mellitus ([place])    Has been under reasonable control.  Follow.  Eating.            Einar Pheasant, MD

## 2015-08-19 NOTE — Progress Notes (Signed)
Order placed for stat creatinine

## 2015-08-20 ENCOUNTER — Encounter: Payer: Self-pay | Admitting: Internal Medicine

## 2015-08-20 ENCOUNTER — Other Ambulatory Visit: Payer: Self-pay | Admitting: Internal Medicine

## 2015-08-20 ENCOUNTER — Telehealth: Payer: Self-pay | Admitting: Internal Medicine

## 2015-08-20 DIAGNOSIS — R109 Unspecified abdominal pain: Secondary | ICD-10-CM

## 2015-08-20 NOTE — Telephone Encounter (Signed)
Sent pt message for clinical update.

## 2015-08-20 NOTE — Progress Notes (Signed)
Order placed for urine culture 

## 2015-08-21 NOTE — Telephone Encounter (Signed)
Noted  

## 2015-08-21 NOTE — Telephone Encounter (Signed)
I called pt to get an update. Pt states that he is feeling better than yesterday. He took Advil TID yesterday & BID today. Not as painful today. I asked pt to send [place] a mychart tomorrow afternoon to update [place] again before the weekend.

## 2015-08-22 ENCOUNTER — Encounter: Payer: Self-pay | Admitting: Internal Medicine

## 2015-08-22 LAB — URINE CULTURE
Colony Count: NO GROWTH
Organism ID, Bacteria: NO GROWTH

## 2015-08-24 ENCOUNTER — Encounter: Payer: Self-pay | Admitting: Internal Medicine

## 2015-08-24 NOTE — Assessment & Plan Note (Signed)
Increased RLQ pain.  Pain to palpation.  Eating .  No bowel change.  Check cbc, urinalysis and metabolic panel.  Check CT abdomen.  Has had previous appendectomy.  Tylenol/advil for pain.

## 2015-08-24 NOTE — Assessment & Plan Note (Signed)
Has been under reasonable control.  Follow.  Eating.

## 2015-11-03 ENCOUNTER — Encounter: Payer: Self-pay | Admitting: Internal Medicine

## 2015-11-06 ENCOUNTER — Ambulatory Visit
Admission: RE | Admit: 2015-11-06 | Discharge: 2015-11-06 | Disposition: A | Discharge status: Home / Self Care | Payer: BLUE CROSS/BLUE SHIELD | Source: Ambulatory Visit | Attending: Internal Medicine | Admitting: Internal Medicine

## 2015-11-06 ENCOUNTER — Encounter: Payer: Self-pay | Admitting: Internal Medicine

## 2015-11-06 ENCOUNTER — Telehealth: Payer: Self-pay | Admitting: Internal Medicine

## 2015-11-06 ENCOUNTER — Ambulatory Visit (INDEPENDENT_AMBULATORY_CARE_PROVIDER_SITE_OTHER): Payer: BLUE CROSS/BLUE SHIELD | Admitting: Internal Medicine

## 2015-11-06 VITALS — BP 110/80 | HR 101 | Temp 98.1°F | Resp 18 | Ht 67.0 in | Wt 197.4 lb

## 2015-11-06 DIAGNOSIS — R918 Other nonspecific abnormal finding of lung field: Secondary | ICD-10-CM | POA: Diagnosis not present

## 2015-11-06 DIAGNOSIS — R059 Cough, unspecified: Secondary | ICD-10-CM

## 2015-11-06 DIAGNOSIS — J189 Pneumonia, unspecified organism: Secondary | ICD-10-CM | POA: Diagnosis not present

## 2015-11-06 DIAGNOSIS — R05 Cough: Secondary | ICD-10-CM | POA: Diagnosis not present

## 2015-11-06 DIAGNOSIS — E1040 Type 1 diabetes mellitus with diabetic neuropathy, unspecified: Secondary | ICD-10-CM

## 2015-11-06 DIAGNOSIS — R062 Wheezing: Secondary | ICD-10-CM | POA: Diagnosis not present

## 2015-11-06 MED ORDER — PREDNISONE 10 MG PO TABS: ORAL_TABLET | ORAL | Status: DC

## 2015-11-06 MED ORDER — ALBUTEROL SULFATE (2.5 MG/3ML) 0.083% IN NEBU
2.5000 mg | INHALATION_SOLUTION | Freq: Once | RESPIRATORY_TRACT | Status: AC
  Administered 2015-11-06: 2.5 mg via RESPIRATORY_TRACT

## 2015-11-06 MED ORDER — ALBUTEROL SULFATE (2.5 MG/3ML) 0.083% IN NEBU: 2.5000 mg | INHALATION_SOLUTION | RESPIRATORY_TRACT | Status: DC | PRN

## 2015-11-06 MED ORDER — NEOMYCIN-POLYMYXIN-HC 3.5-10000-1 OT SOLN: 3.0000 [drp] | Freq: Four times a day (QID) | OTIC | Status: DC

## 2015-11-06 MED ORDER — DOXYCYCLINE HYCLATE 100 MG PO TABS: 100.0000 mg | ORAL_TABLET | Freq: Two times a day (BID) | ORAL | Status: DC

## 2015-11-06 NOTE — Progress Notes (Signed)
Patient ID: Eric Hayden, male   DOB: 07-18-1976, 40 y.o.   MRN: [id number]   Subjective:    Patient ID: Eric Hayden, male    DOB: 1975/11/24, 40 y.o.   MRN: [id number]  HPI  Patient here as a work in.  Was seen at Urgent Care on 10/31/15.  Concern over pneumonia.  Treated with zpak and prednisone.  No cxr performed.  He comes in today with persistent increased cough, congestion and wheezing.  Symptoms began over one week ago with increased sinus congestion.  Progressed to chest congestion and increased cough.  Having coughing fits.  Went to UC.  Was given two breathing treatments.  Placed on azithromycin and prednisone.  Had albuterol inhaler at home.  Has been using q 4 hours.  States does feel some better today.  Feels can get in a better breath.  Still with increased cough and congestion.  Has been monitoring sugars closely since on prednisone.  Has been able to keep under reasonable control.     Past Medical History  Diagnosis Date  . Asthma   . Cancer ([place])   . Chicken pox   . Type 1 diabetes mellitus ([place])   . GERD (gastroesophageal reflux disease)   . Hx of oral aphthous ulcers   . Heart murmur   . Migraines   . Hypothyroidism    Past Surgical History  Procedure Laterality Date  . Appendectomy  2012  . Tonsillectomy and adenoidectomy  2007   Family History  Problem Relation Age of Onset  . Arthritis Mother   . Hyperlipidemia Mother   . Hypertension Mother   . Diabetes Mother     type 2  . Hyperlipidemia Father   . Arthritis Maternal Grandmother   . Hyperlipidemia Maternal Grandmother   . Heart disease Maternal Grandmother   . Hypertension Maternal Grandmother   . Diabetes Maternal Grandmother     type 2  . Arthritis Maternal Grandfather   . Heart disease Maternal Grandfather   . Hypertension Maternal Grandfather   . Arthritis Paternal Grandmother   . Arthritis Paternal Grandfather    Social History   Social History  . Marital Status: Married    Spouse Name:  N/A  . Number of Children: 2  . Years of Education: N/A   Social History Main Topics  . Smoking status: Never Smoker   . Smokeless tobacco: Never Used  . Alcohol Use: No  . Drug Use: No  . Sexual Activity: Not Asked   Other Topics Concern  . None   Social History Narrative    Outpatient Encounter Prescriptions as of 11/06/2015  Medication Sig  . amitriptyline (ELAVIL) 25 MG tablet TAKE ONE TABLET BY MOUTH EVERY DAY  . atorvastatin (LIPITOR) 10 MG tablet TAKE ONE TABLET BY MOUTH EVERY DAY  . insulin lispro (HUMALOG) 100 UNIT/ML injection As directed.  Dispense 4 vials.  Marland Kitchen levothyroxine (SYNTHROID, LEVOTHROID) 112 MCG tablet Take 112 mcg by mouth daily before breakfast.  . LYRICA 75 MG capsule TAKE 1 CAPSULE TWICE DAILY  . omeprazole (PRILOSEC) 20 MG capsule TAKE 1 CAPSULE BY MOUTH EVERY MORNING  . ranitidine (ZANTAC) 150 MG tablet Take 150 mg by mouth at bedtime.  Marland Kitchen albuterol (PROVENTIL) (2.5 MG/3ML) 0.083% nebulizer solution Take 3 mLs (2.5 mg total) by nebulization every 4 (four) hours as needed for wheezing or shortness of breath.  . doxycycline (VIBRA-TABS) 100 MG tablet Take 1 tablet (100 mg total) by mouth 2 (two) times daily.  Marland Kitchen  neomycin-polymyxin-hydrocortisone (CORTISPORIN) otic solution Place 3 drops into the right ear 4 (four) times daily.  . predniSONE (DELTASONE) 10 MG tablet Take 6 tablets x 1 day and then decrease by 1/2 tablet per day until down to zero mg.  . [EXPIRED] albuterol (PROVENTIL) (2.5 MG/3ML) 0.083% nebulizer solution 2.5 mg    No facility-administered encounter medications on file as of 11/06/2015.    Review of Systems  Constitutional: Positive for fatigue. Negative for appetite change.  HENT: Positive for congestion, postnasal drip and sinus pressure.   Respiratory: Positive for cough and wheezing.        Has felt he can't get a good breath in.    Cardiovascular: Negative for palpitations and leg swelling.  Gastrointestinal: Negative for nausea,  vomiting and abdominal pain.       No diarrhea in the last several days.   Genitourinary: Negative for dysuria and difficulty urinating.  Skin: Negative for color change and rash.  Neurological: Negative for dizziness and light-headedness.       Objective:    Physical Exam  Constitutional: He appears well-developed and well-nourished.  HENT:  Increased sinus tenderness to palpation.  Nares - slightly erythematous turbinates.  OP - without lesions.  Right ear - with wax and question of otitis externa.    Eyes: Conjunctivae are normal. Right eye exhibits no discharge. Left eye exhibits no discharge.  Neck: Neck supple.  Cardiovascular: Normal rate and regular rhythm.   Pulmonary/Chest: Effort normal and breath sounds normal.  Increased chest congestion and cough.  Increased cough with expiration and forced expiration.  Increased wheezing.  Some increased air movement after nebulizer treatment.  Cough improved some after treatment.    Abdominal: Soft. Bowel sounds are normal. There is no tenderness.  Musculoskeletal: He exhibits no edema.  Lymphadenopathy:    He has no cervical adenopathy.  Psychiatric: He has a normal mood and affect. His behavior is normal.    BP 110/80 mmHg  Pulse 101  Temp(Src) 98.1 F (36.7 C) (Oral)  Resp 18  Ht 5' 7"  (1.702 m)  Wt 197 lb 6 oz (89.529 kg)  BMI 30.91 kg/m2  SpO2 87% Wt Readings from Last 3 Encounters:  11/06/15 197 lb 6 oz (89.529 kg)  08/19/15 196 lb 4 oz (89.018 kg)  08/14/15 198 lb 6.4 oz (89.994 kg)     Lab Results  Component Value Date   WBC 8.9 08/19/2015   HGB 14.3 08/19/2015   HCT 44.2 08/19/2015   PLT 276.0 08/19/2015   GLUCOSE 205* 08/19/2015   CHOL 129 07/25/2015   TRIG 135.0 07/25/2015   HDL 33.40* 07/25/2015   LDLCALC 69 07/25/2015   ALT 18 07/25/2015   AST 15 07/25/2015   NA 140 08/19/2015   K 4.5 08/19/2015   CL 101 08/19/2015   CREATININE 0.97 08/19/2015   BUN 13 08/19/2015   CO2 31 08/19/2015   TSH 0.44  07/25/2015   HGBA1C 7.6* 12/20/2014   MICROALBUR <0.7 07/25/2015        Assessment & Plan:   Problem List Items Addressed This Visit    CAP (community acquired pneumonia)    cxr revealed pneumonia.  Has been on azithromycin.  Had allergy to levaquin and augmentin.  Added doxycycline.  Prednisone taper as directed.  Saline nasal spray and nasacort nasal spray as directed.  mucinex in the am and robitussin DM in the evening.  abuterol neb q 4-6 hours atc.  Follow closely.  Notify me if symptoms change, worsen.  Call with update over next 24 hours.        Relevant Medications   albuterol (PROVENTIL) (2.5 MG/3ML) 0.083% nebulizer solution 2.5 mg (Completed)   doxycycline (VIBRA-TABS) 100 MG tablet   albuterol (PROVENTIL) (2.5 MG/3ML) 0.083% nebulizer solution   Type 1 diabetes mellitus ([place])    Discussed at length with him today regarding the need for prednisone.  Discussed my concern regarding elevated blood sugars with continued need for steroids.  Discussed admission and ER evaluation.  He would like to avoid ER or hospitalization if possible.  Will need to closely monitor blood sugars.  Adjust insulin as required.  Follow closely.         Other Visit Diagnoses    Wheezing    -  Primary    Relevant Medications    albuterol (PROVENTIL) (2.5 MG/3ML) 0.083% nebulizer solution 2.5 mg (Completed)    Other Relevant Orders    DG Chest 2 View (Completed)    Cough        Relevant Medications    albuterol (PROVENTIL) (2.5 MG/3ML) 0.083% nebulizer solution 2.5 mg (Completed)    Other Relevant Orders    DG Chest 2 View (Completed)        Einar Pheasant, MD

## 2015-11-06 NOTE — Progress Notes (Signed)
Pre-visit discussion using our clinic review tool. No additional management support is needed unless otherwise documented below in the visit note.  

## 2015-11-06 NOTE — Patient Instructions (Addendum)
Saline nasal spray - flush nose at least 2-3x/day  nasacort nasal spray - 2 sprays each nostril one time per day.  Do this in the evening.    Take the probiotic daily.   mucinex DM in the am and robitussin DM in the evening.

## 2015-11-06 NOTE — Telephone Encounter (Signed)
Update message.

## 2015-11-07 ENCOUNTER — Telehealth: Payer: Self-pay

## 2015-11-07 NOTE — Telephone Encounter (Signed)
I spoke with the patient.  He received your message and he wanted you to know that he is feeling alittle better.  Thanks

## 2015-11-07 NOTE — Telephone Encounter (Signed)
Sent my chart message with results and update.  Please call later today and check on pt and confirm received my chart message.  Thanks

## 2015-11-07 NOTE — Telephone Encounter (Signed)
Tonya from [facility] called CXR results to the office. Patchy infiltrate in the lingula, most likely pneumonia. Lungs elsewhere clear. Stable cardiac silhouette.  Followup PA and lateral chest radiographs recommended in 3-4 weeks following trial of antibiotic therapy to ensure resolution and exclude underlying malignancy.  Please advise. thanks

## 2015-11-07 NOTE — Telephone Encounter (Signed)
Tell him to keep me posted and let me know how he is doing.  He can message me this weekend.

## 2015-11-09 ENCOUNTER — Encounter: Payer: Self-pay | Admitting: Internal Medicine

## 2015-11-09 DIAGNOSIS — J189 Pneumonia, unspecified organism: Secondary | ICD-10-CM | POA: Insufficient documentation

## 2015-11-09 NOTE — Assessment & Plan Note (Signed)
cxr revealed pneumonia.  Has been on azithromycin.  Had allergy to levaquin and augmentin.  Added doxycycline.  Prednisone taper as directed.  Saline nasal spray and nasacort nasal spray as directed.  mucinex in the am and robitussin DM in the evening.  abuterol neb q 4-6 hours atc.  Follow closely.  Notify me if symptoms change, worsen.  Call with update over next 24 hours.

## 2015-11-09 NOTE — Assessment & Plan Note (Signed)
Discussed at length with him today regarding the need for prednisone.  Discussed my concern regarding elevated blood sugars with continued need for steroids.  Discussed admission and ER evaluation.  He would like to avoid ER or hospitalization if possible.  Will need to closely monitor blood sugars.  Adjust insulin as required.  Follow closely.

## 2015-11-17 ENCOUNTER — Other Ambulatory Visit: Payer: Self-pay | Admitting: Internal Medicine

## 2015-11-17 DIAGNOSIS — J189 Pneumonia, unspecified organism: Secondary | ICD-10-CM

## 2015-11-17 NOTE — Progress Notes (Signed)
Order placed for cxr.

## 2015-11-24 ENCOUNTER — Ambulatory Visit
Admission: RE | Admit: 2015-11-24 | Discharge: 2015-11-24 | Disposition: A | Discharge status: Home / Self Care | Payer: BLUE CROSS/BLUE SHIELD | Source: Ambulatory Visit | Attending: Internal Medicine | Admitting: Internal Medicine

## 2015-11-24 DIAGNOSIS — J189 Pneumonia, unspecified organism: Secondary | ICD-10-CM | POA: Diagnosis not present

## 2015-11-25 ENCOUNTER — Encounter: Payer: Self-pay | Admitting: Internal Medicine

## 2015-12-04 ENCOUNTER — Encounter: Payer: Self-pay | Admitting: Internal Medicine

## 2015-12-04 LAB — HM DIABETES EYE EXAM

## 2016-02-06 ENCOUNTER — Other Ambulatory Visit: Payer: BLUE CROSS/BLUE SHIELD

## 2016-02-10 ENCOUNTER — Other Ambulatory Visit (INDEPENDENT_AMBULATORY_CARE_PROVIDER_SITE_OTHER): Payer: BLUE CROSS/BLUE SHIELD

## 2016-02-10 DIAGNOSIS — E1040 Type 1 diabetes mellitus with diabetic neuropathy, unspecified: Secondary | ICD-10-CM | POA: Diagnosis not present

## 2016-02-10 DIAGNOSIS — E039 Hypothyroidism, unspecified: Secondary | ICD-10-CM

## 2016-02-10 DIAGNOSIS — E7800 Pure hypercholesterolemia, unspecified: Secondary | ICD-10-CM | POA: Diagnosis not present

## 2016-02-10 DIAGNOSIS — R1031 Right lower quadrant pain: Secondary | ICD-10-CM

## 2016-02-10 DIAGNOSIS — R748 Abnormal levels of other serum enzymes: Secondary | ICD-10-CM

## 2016-02-10 DIAGNOSIS — R109 Unspecified abdominal pain: Secondary | ICD-10-CM

## 2016-02-10 LAB — LIPID PANEL
CHOL/HDL RATIO: 4
Cholesterol: 126 mg/dL (ref 0–200)
HDL: 29.1 mg/dL — ABNORMAL LOW (ref >39.00)
LDL CALC: 67 mg/dL (ref 0–99)
NonHDL: 96.84
Triglycerides: 150 mg/dL — ABNORMAL HIGH (ref 0.0–149.0)
VLDL: 30 mg/dL (ref 0.0–40.0)

## 2016-02-10 LAB — BASIC METABOLIC PANEL
BUN: 15 mg/dL (ref 6–23)
CALCIUM: 9.1 mg/dL (ref 8.4–10.5)
CO2: 29 meq/L (ref 19–32)
Chloride: 105 mEq/L (ref 96–112)
Creatinine, Ser: 0.98 mg/dL (ref 0.40–1.50)
GFR: 90.34 mL/min (ref >60.00)
GLUCOSE: 148 mg/dL — AB (ref 70–99)
POTASSIUM: 4.2 meq/L (ref 3.5–5.1)
SODIUM: 140 meq/L (ref 135–145)

## 2016-02-10 LAB — TSH: TSH: 0.73 u[IU]/mL (ref 0.35–4.50)

## 2016-02-10 LAB — MICROALBUMIN / CREATININE URINE RATIO
CREATININE, U: 186.2 mg/dL
MICROALB/CREAT RATIO: 0.4 mg/g (ref 0.0–30.0)
Microalb, Ur: <0.7 mg/dL (ref 0.0–1.9)

## 2016-02-10 LAB — HEPATIC FUNCTION PANEL
ALT: 25 U/L (ref 0–53)
AST: 19 U/L (ref 0–37)
Albumin: 4 g/dL (ref 3.5–5.2)
Alkaline Phosphatase: 119 U/L — ABNORMAL HIGH (ref 39–117)
BILIRUBIN DIRECT: 0.1 mg/dL (ref 0.0–0.3)
BILIRUBIN TOTAL: 0.5 mg/dL (ref 0.2–1.2)
Total Protein: 7 g/dL (ref 6.0–8.3)

## 2016-02-10 LAB — CREATININE, SERUM: Creatinine, Ser: 1 mg/dL (ref 0.40–1.50)

## 2016-02-10 LAB — HEMOGLOBIN A1C: Hgb A1c MFr Bld: 7.3 % — ABNORMAL HIGH (ref 4.6–6.5)

## 2016-02-11 ENCOUNTER — Encounter: Payer: Self-pay | Admitting: Internal Medicine

## 2016-02-11 ENCOUNTER — Ambulatory Visit (INDEPENDENT_AMBULATORY_CARE_PROVIDER_SITE_OTHER): Payer: BLUE CROSS/BLUE SHIELD | Admitting: Internal Medicine

## 2016-02-11 VITALS — BP 110/70 | HR 87 | Temp 98.3°F | Resp 18 | Ht 67.0 in | Wt 199.5 lb

## 2016-02-11 DIAGNOSIS — G4733 Obstructive sleep apnea (adult) (pediatric): Secondary | ICD-10-CM | POA: Diagnosis not present

## 2016-02-11 DIAGNOSIS — E039 Hypothyroidism, unspecified: Secondary | ICD-10-CM

## 2016-02-11 DIAGNOSIS — K219 Gastro-esophageal reflux disease without esophagitis: Secondary | ICD-10-CM | POA: Diagnosis not present

## 2016-02-11 DIAGNOSIS — E7800 Pure hypercholesterolemia, unspecified: Secondary | ICD-10-CM

## 2016-02-11 DIAGNOSIS — R748 Abnormal levels of other serum enzymes: Secondary | ICD-10-CM

## 2016-02-11 DIAGNOSIS — G629 Polyneuropathy, unspecified: Secondary | ICD-10-CM

## 2016-02-11 DIAGNOSIS — E1040 Type 1 diabetes mellitus with diabetic neuropathy, unspecified: Secondary | ICD-10-CM

## 2016-02-11 NOTE — Progress Notes (Signed)
Patient ID: Eric Hayden, male   DOB: Nov 20, 1975, 40 y.o.   MRN: [id number]   Subjective:    Patient ID: Eric Hayden, male    DOB: 06-01-1976, 40 y.o.   MRN: [id number]  HPI  Patient here for a scheduled follow up.   States he is doing well.  Feels good.  Saw endocrinology.  Basal insulin adjusted.  Planning to see about getting a new pump.  No headache, light headedness or dizziness. No chest pain with increased activity or exertion.  No sob.  No acid relfux.  No abdominal pain or cramping.  Bowels stable.     Past Medical History  Diagnosis Date  . Asthma   . Cancer ([place])   . Chicken pox   . Type 1 diabetes mellitus ([place])   . GERD (gastroesophageal reflux disease)   . Hx of oral aphthous ulcers   . Heart murmur   . Migraines   . Hypothyroidism    Past Surgical History  Procedure Laterality Date  . Appendectomy  2012  . Tonsillectomy and adenoidectomy  2007   Family History  Problem Relation Age of Onset  . Arthritis Mother   . Hyperlipidemia Mother   . Hypertension Mother   . Diabetes Mother     type 2  . Hyperlipidemia Father   . Arthritis Maternal Grandmother   . Hyperlipidemia Maternal Grandmother   . Heart disease Maternal Grandmother   . Hypertension Maternal Grandmother   . Diabetes Maternal Grandmother     type 2  . Arthritis Maternal Grandfather   . Heart disease Maternal Grandfather   . Hypertension Maternal Grandfather   . Arthritis Paternal Grandmother   . Arthritis Paternal Grandfather    Social History   Social History  . Marital Status: Married    Spouse Name: N/A  . Number of Children: 2  . Years of Education: N/A   Social History Main Topics  . Smoking status: Never Smoker   . Smokeless tobacco: Never Used  . Alcohol Use: No  . Drug Use: No  . Sexual Activity: Not Asked   Other Topics Concern  . None   Social History Narrative    Outpatient Encounter Prescriptions as of 02/11/2016  Medication Sig  . albuterol (PROVENTIL) (2.5  MG/3ML) 0.083% nebulizer solution Take 3 mLs (2.5 mg total) by nebulization every 4 (four) hours as needed for wheezing or shortness of breath.  Marland Kitchen amitriptyline (ELAVIL) 25 MG tablet TAKE ONE TABLET BY MOUTH EVERY DAY  . atorvastatin (LIPITOR) 10 MG tablet TAKE ONE TABLET BY MOUTH EVERY DAY  . insulin lispro (HUMALOG) 100 UNIT/ML injection As directed.  Dispense 4 vials.  Marland Kitchen levothyroxine (SYNTHROID, LEVOTHROID) 112 MCG tablet Take 112 mcg by mouth daily before breakfast.  . LYRICA 75 MG capsule TAKE 1 CAPSULE TWICE DAILY  . omeprazole (PRILOSEC) 20 MG capsule TAKE 1 CAPSULE BY MOUTH EVERY MORNING  . ranitidine (ZANTAC) 150 MG tablet Take 150 mg by mouth at bedtime.  . [DISCONTINUED] doxycycline (VIBRA-TABS) 100 MG tablet Take 1 tablet (100 mg total) by mouth 2 (two) times daily.  . [DISCONTINUED] neomycin-polymyxin-hydrocortisone (CORTISPORIN) otic solution Place 3 drops into the right ear 4 (four) times daily.  . [DISCONTINUED] predniSONE (DELTASONE) 10 MG tablet Take 6 tablets x 1 day and then decrease by 1/2 tablet per day until down to zero mg.   No facility-administered encounter medications on file as of 02/11/2016.    Review of Systems  Constitutional: Negative for appetite change  and unexpected weight change.  HENT: Negative for congestion and sinus pressure.   Respiratory: Negative for cough, chest tightness and shortness of breath.   Cardiovascular: Negative for chest pain, palpitations and leg swelling.  Gastrointestinal: Negative for nausea, vomiting, abdominal pain and diarrhea.  Genitourinary: Negative for dysuria and difficulty urinating.  Musculoskeletal: Negative for back pain and joint swelling.  Skin: Negative for color change and rash.  Neurological: Negative for dizziness, light-headedness and headaches.  Psychiatric/Behavioral: Negative for dysphoric mood and agitation.       Objective:    Physical Exam  Constitutional: He appears well-developed and  well-nourished. No distress.  HENT:  Nose: Nose normal.  Mouth/Throat: Oropharynx is clear and moist.  Neck: Neck supple. No thyromegaly present.  Cardiovascular: Normal rate and regular rhythm.   Pulmonary/Chest: Effort normal and breath sounds normal. No respiratory distress.  Abdominal: Soft. Bowel sounds are normal. There is no tenderness.  Musculoskeletal: He exhibits no edema or tenderness.  Lymphadenopathy:    He has no cervical adenopathy.  Skin: No rash noted. No erythema.  Psychiatric: He has a normal mood and affect. His behavior is normal.    BP 110/70 mmHg  Pulse 87  Temp(Src) 98.3 F (36.8 C) (Oral)  Resp 18  Ht 5' 7"  (1.702 m)  Wt 199 lb 8 oz (90.493 kg)  BMI 31.24 kg/m2  SpO2 97% Wt Readings from Last 3 Encounters:  02/11/16 199 lb 8 oz (90.493 kg)  11/06/15 197 lb 6 oz (89.529 kg)  08/19/15 196 lb 4 oz (89.018 kg)     Lab Results  Component Value Date   WBC 8.9 08/19/2015   HGB 14.3 08/19/2015   HCT 44.2 08/19/2015   PLT 276.0 08/19/2015   GLUCOSE 148* 02/10/2016   CHOL 126 02/10/2016   TRIG 150.0* 02/10/2016   HDL 29.10* 02/10/2016   LDLCALC 67 02/10/2016   ALT 25 02/10/2016   AST 19 02/10/2016   NA 140 02/10/2016   K 4.2 02/10/2016   CL 105 02/10/2016   CREATININE 0.98 02/10/2016   CREATININE 1.00 02/10/2016   BUN 15 02/10/2016   CO2 29 02/10/2016   TSH 0.73 02/10/2016   HGBA1C 7.3* 02/10/2016   MICROALBUR <0.7 02/10/2016        Assessment & Plan:   Problem List Items Addressed This Visit    Elevated alkaline phosphatase level - Primary    Abdominal ultrasound reviewed and ok.  Saw GI.  Follow.  Currently asymptomatic.        GERD (gastroesophageal reflux disease)    On prilosec.  No acid reflux symptoms.  Follow.       Hypercholesterolemia    Low cholesterol diet and exercise.  Follow lipid panel and liver function tests.  On lipitor..        Hypothyroidism    On thyroid replacement.  Follow tsh.        Neuropathy ([place])      Has neuropathic chest pain.  On lyrica.  Follow.  Stable.        Obstructive sleep apnea    CPAP.       Type 1 diabetes mellitus ([place])    Sees endocrinology.  Sugars have been doing better.  Has insulin pump.  Continue f/u with endocrinology.            Einar Pheasant, MD

## 2016-02-11 NOTE — Progress Notes (Signed)
Pre-visit discussion using our clinic review tool. No additional management support is needed unless otherwise documented below in the visit note.  

## 2016-02-12 LAB — CULTURE, URINE COMPREHENSIVE
Colony Count: NO GROWTH
ORGANISM ID, BACTERIA: NO GROWTH

## 2016-02-15 ENCOUNTER — Encounter: Payer: Self-pay | Admitting: Internal Medicine

## 2016-02-15 NOTE — Assessment & Plan Note (Signed)
On prilosec.  No acid reflux symptoms.  Follow.

## 2016-02-15 NOTE — Assessment & Plan Note (Signed)
On thyroid replacement.  Follow tsh.  

## 2016-02-15 NOTE — Assessment & Plan Note (Signed)
Sees endocrinology.  Sugars have been doing better.  Has insulin pump.  Continue f/u with endocrinology.

## 2016-02-15 NOTE — Assessment & Plan Note (Signed)
CPAP.  

## 2016-02-15 NOTE — Assessment & Plan Note (Signed)
Low cholesterol diet and exercise.  Follow lipid panel and liver function tests.  On lipitor.   

## 2016-02-15 NOTE — Assessment & Plan Note (Signed)
Abdominal ultrasound reviewed and ok.  Saw GI.  Follow.  Currently asymptomatic.

## 2016-02-15 NOTE — Assessment & Plan Note (Signed)
Has neuropathic chest pain.  On lyrica.  Follow.  Stable.

## 2016-04-06 ENCOUNTER — Encounter: Payer: Self-pay | Admitting: Internal Medicine

## 2016-04-07 NOTE — Telephone Encounter (Signed)
If he is ok to wait until next week, I can see him at 12:00 on 04/13/16.  Let me know if problem.

## 2016-04-08 NOTE — Telephone Encounter (Signed)
I looked at my schedule.  I have an opening on Monday at 8:00.  He states he can come in anytime Monday.  Can schedule then.

## 2016-04-12 ENCOUNTER — Encounter: Payer: Self-pay | Admitting: Internal Medicine

## 2016-04-12 ENCOUNTER — Ambulatory Visit (INDEPENDENT_AMBULATORY_CARE_PROVIDER_SITE_OTHER): Payer: BLUE CROSS/BLUE SHIELD | Admitting: Internal Medicine

## 2016-04-12 VITALS — BP 118/88 | HR 86 | Temp 98.4°F | Wt 203.8 lb

## 2016-04-12 DIAGNOSIS — H6123 Impacted cerumen, bilateral: Secondary | ICD-10-CM | POA: Diagnosis not present

## 2016-04-12 DIAGNOSIS — E1040 Type 1 diabetes mellitus with diabetic neuropathy, unspecified: Secondary | ICD-10-CM | POA: Diagnosis not present

## 2016-04-12 DIAGNOSIS — R42 Dizziness and giddiness: Secondary | ICD-10-CM | POA: Insufficient documentation

## 2016-04-12 MED ORDER — CARBAMIDE PEROXIDE 6.5 % OT SOLN: OTIC | 0 refills | Status: DC

## 2016-04-12 NOTE — Progress Notes (Addendum)
Patient ID: Eric Hayden, male   DOB: 30-Aug-1975, 40 y.o.   MRN: [id number]   Subjective:    Patient ID: Eric Hayden, male    DOB: [birth year], 40 y.o.   MRN: [id number]  HPI  Patient here as a work in with concerns regarding intermittent dizziness.  He first noticed symptoms three weeks ago.  States he woke up.  Ate.  After eating states the room started spinning.  This resolved and his head just felt a little fuzzy the remainder of the day.  States two weeks ago, he had similar symptoms on back to back days.  Occurred in am.  Room spinning.  Resolved and then just felt fuzzy headed.  The last episode was approximately one week ago.  With each episode, occurred in am.  The last few episodes occurred right with getting up.  States sugar has not been too hight or too low.  No headache.  No increased sinus congestion or allergy symptoms.  No chest pain.  No sob.  No increased heart rate or palpitations.  States otherwise feels good.  Staying active.    Past Medical History:  Diagnosis Date  . Asthma   . Cancer ([place])   . Chicken pox   . GERD (gastroesophageal reflux disease)   . Heart murmur   . Hx of oral aphthous ulcers   . Hypothyroidism   . Migraines   . Type 1 diabetes mellitus ([place])    Past Surgical History:  Procedure Laterality Date  . APPENDECTOMY  2012  . TONSILLECTOMY AND ADENOIDECTOMY  2007   Family History  Problem Relation Age of Onset  . Arthritis Maternal Grandmother   . Hyperlipidemia Maternal Grandmother   . Heart disease Maternal Grandmother   . Hypertension Maternal Grandmother   . Diabetes Maternal Grandmother     type 2  . Arthritis Maternal Grandfather   . Heart disease Maternal Grandfather   . Hypertension Maternal Grandfather   . Arthritis Mother   . Hyperlipidemia Mother   . Hypertension Mother   . Diabetes Mother     type 2  . Hyperlipidemia Father   . Arthritis Paternal Grandmother   . Arthritis Paternal Grandfather    Social History   Social  History  . Marital status: Married    Spouse name: N/A  . Number of children: 2  . Years of education: N/A   Social History Main Topics  . Smoking status: Never Smoker  . Smokeless tobacco: Never Used  . Alcohol use No  . Drug use: No  . Sexual activity: Not Asked   Other Topics Concern  . None   Social History Narrative  . None    Outpatient Encounter Prescriptions as of 04/12/2016  Medication Sig  . amitriptyline (ELAVIL) 25 MG tablet TAKE ONE TABLET BY MOUTH EVERY DAY  . atorvastatin (LIPITOR) 10 MG tablet TAKE ONE TABLET BY MOUTH EVERY DAY  . insulin lispro (HUMALOG) 100 UNIT/ML injection As directed.  Dispense 4 vials.  Marland Kitchen levothyroxine (SYNTHROID, LEVOTHROID) 112 MCG tablet Take 112 mcg by mouth daily before breakfast.  . LYRICA 75 MG capsule TAKE 1 CAPSULE TWICE DAILY  . omeprazole (PRILOSEC) 20 MG capsule TAKE 1 CAPSULE BY MOUTH EVERY MORNING  . ranitidine (ZANTAC) 150 MG tablet Take 150 mg by mouth at bedtime.  Marland Kitchen albuterol (PROVENTIL) (2.5 MG/3ML) 0.083% nebulizer solution Take 3 mLs (2.5 mg total) by nebulization every 4 (four) hours as needed for wheezing or shortness of breath. (Patient not  taking: Reported on 04/12/2016)  . carbamide peroxide (DEBROX) 6.5 % otic solution 3-4 drops in both ears q day.  Massage approximately 5 minutes   No facility-administered encounter medications on file as of 04/12/2016.     Review of Systems  Constitutional: Negative for appetite change and fever.  HENT: Negative for congestion, ear pain and sinus pressure.   Respiratory: Negative for cough, chest tightness and shortness of breath.   Cardiovascular: Negative for chest pain, palpitations and leg swelling.  Gastrointestinal: Negative for nausea and vomiting.  Skin: Negative for color change and rash.  Neurological: Positive for dizziness. Negative for headaches.       Objective:     Blood pressure rechecked by me:  116/78 lying.  112/78 standing.     Physical Exam    Constitutional: He appears well-developed and well-nourished. No distress.  HENT:  Nose: Nose normal.  Mouth/Throat: Oropharynx is clear and moist.  Impacted cerumen - bilateral ears.  No reproducible symptoms on exam today.   Neck: Neck supple.  Cardiovascular: Normal rate and regular rhythm.   Pulmonary/Chest: Effort normal and breath sounds normal. No respiratory distress.  Abdominal: Soft. Bowel sounds are normal. There is no tenderness.  Musculoskeletal: He exhibits no edema or tenderness.  Lymphadenopathy:    He has no cervical adenopathy.  Skin: No rash noted. No erythema.  Psychiatric: He has a normal mood and affect. His behavior is normal.    BP 118/88   Pulse 86   Temp 98.4 F (36.9 C) (Oral)   Wt 203 lb 12.8 oz (92.4 kg)   SpO2 96%   BMI 31.92 kg/m  Wt Readings from Last 3 Encounters:  04/12/16 203 lb 12.8 oz (92.4 kg)  02/11/16 199 lb 8 oz (90.5 kg)  11/06/15 197 lb 6 oz (89.5 kg)     Lab Results  Component Value Date   WBC 8.9 08/19/2015   HGB 14.3 08/19/2015   HCT 44.2 08/19/2015   PLT 276.0 08/19/2015   GLUCOSE 148 (H) 02/10/2016   CHOL 126 02/10/2016   TRIG 150.0 (H) 02/10/2016   HDL 29.10 (L) 02/10/2016   LDLCALC 67 02/10/2016   ALT 25 02/10/2016   AST 19 02/10/2016   NA 140 02/10/2016   K 4.2 02/10/2016   CL 105 02/10/2016   CREATININE 0.98 02/10/2016   CREATININE 1.00 02/10/2016   BUN 15 02/10/2016   CO2 29 02/10/2016   TSH 0.73 02/10/2016   HGBA1C 7.3 (H) 02/10/2016   MICROALBUR <0.7 02/10/2016       Assessment & Plan:   Problem List Items Addressed This Visit    Dizziness    Persistent intermittent episodes.  Not reproducible on exam today.  Blood pressure doing well.  Not orthostatic.  Sugars ok.  Describes room spinning.  Question of vertigo.  No focal findings on exam.  Will refer to ENT for evaluation.  Pt comfortable with this plan.  Discussed at length.  Any change or worsening problems, he is to be reevaluated.         Relevant Orders   Ambulatory referral to ENT   Type 1 diabetes mellitus ([place])    Sugars have been doing ok.  No significant highs or lows.  Eating regularly.  Staying hydrated.         Other Visit Diagnoses    Cerumen impaction, bilateral    -  Primary   debrox as directed.  have removed by ENT.    Relevant Orders   Ambulatory  referral to ENT       Einar Pheasant, MD

## 2016-04-12 NOTE — Progress Notes (Signed)
Pre visit review using our clinic review tool, if applicable. No additional management support is needed unless otherwise documented below in the visit note. 

## 2016-04-13 ENCOUNTER — Encounter: Payer: Self-pay | Admitting: Internal Medicine

## 2016-04-13 NOTE — Assessment & Plan Note (Signed)
Persistent intermittent episodes.  Not reproducible on exam today.  Blood pressure doing well.  Not orthostatic.  Sugars ok.  Describes room spinning.  Question of vertigo.  No focal findings on exam.  Will refer to ENT for evaluation.  Pt comfortable with this plan.  Discussed at length.  Any change or worsening problems, he is to be reevaluated.

## 2016-04-13 NOTE — Assessment & Plan Note (Signed)
Sugars have been doing ok.  No significant highs or lows.  Eating regularly.  Staying hydrated.

## 2016-05-05 ENCOUNTER — Encounter: Payer: Self-pay | Admitting: Internal Medicine

## 2016-05-06 NOTE — Telephone Encounter (Signed)
Patient will need to be seen, please schedule appmt

## 2016-05-07 ENCOUNTER — Ambulatory Visit (INDEPENDENT_AMBULATORY_CARE_PROVIDER_SITE_OTHER): Payer: BLUE CROSS/BLUE SHIELD

## 2016-05-07 ENCOUNTER — Ambulatory Visit (INDEPENDENT_AMBULATORY_CARE_PROVIDER_SITE_OTHER): Payer: BLUE CROSS/BLUE SHIELD | Admitting: Internal Medicine

## 2016-05-07 ENCOUNTER — Encounter: Payer: Self-pay | Admitting: Internal Medicine

## 2016-05-07 VITALS — BP 110/60 | HR 87 | Temp 97.9°F | Wt 204.6 lb

## 2016-05-07 DIAGNOSIS — R05 Cough: Secondary | ICD-10-CM

## 2016-05-07 DIAGNOSIS — R062 Wheezing: Secondary | ICD-10-CM

## 2016-05-07 DIAGNOSIS — E1040 Type 1 diabetes mellitus with diabetic neuropathy, unspecified: Secondary | ICD-10-CM

## 2016-05-07 DIAGNOSIS — R059 Cough, unspecified: Secondary | ICD-10-CM | POA: Insufficient documentation

## 2016-05-07 MED ORDER — PREDNISONE 10 MG PO TABS: ORAL_TABLET | ORAL | 0 refills | Status: DC

## 2016-05-07 MED ORDER — ALBUTEROL SULFATE (2.5 MG/3ML) 0.083% IN NEBU: 2.5000 mg | INHALATION_SOLUTION | RESPIRATORY_TRACT | 1 refills | Status: DC | PRN

## 2016-05-07 MED ORDER — AZITHROMYCIN 250 MG PO TABS: ORAL_TABLET | ORAL | 0 refills | Status: DC

## 2016-05-07 NOTE — Progress Notes (Signed)
Patient ID: Eric Hayden, male   DOB: [birth year], 40 y.o.   MRN: [id number]   Subjective:    Patient ID: Eric Hayden, male    DOB: [birth year], 40 y.o.   MRN: [id number]  HPI  Patient here as a work in with concerns regarding increased cough and congestion.  States symptoms started 6 days ago.  Started with sore throat.  Then developed sinus and nasal congestion.  Increased cough and chest congestion.  Has used his nebulizer the last two nights.  Cough and chest congestion have worsened.  Productive colored mucus.  Recently treated for pneumonia.     Past Medical History:  Diagnosis Date  . Asthma   . Cancer ([place])   . Chicken pox   . GERD (gastroesophageal reflux disease)   . Heart murmur   . Hx of oral aphthous ulcers   . Hypothyroidism   . Migraines   . Type 1 diabetes mellitus ([place])    Past Surgical History:  Procedure Laterality Date  . APPENDECTOMY  2012  . TONSILLECTOMY AND ADENOIDECTOMY  2007   Family History  Problem Relation Age of Onset  . Arthritis Maternal Grandmother   . Hyperlipidemia Maternal Grandmother   . Heart disease Maternal Grandmother   . Hypertension Maternal Grandmother   . Diabetes Maternal Grandmother     type 2  . Arthritis Maternal Grandfather   . Heart disease Maternal Grandfather   . Hypertension Maternal Grandfather   . Arthritis Mother   . Hyperlipidemia Mother   . Hypertension Mother   . Diabetes Mother     type 2  . Hyperlipidemia Father   . Arthritis Paternal Grandmother   . Arthritis Paternal Grandfather    Social History   Social History  . Marital status: Married    Spouse name: N/A  . Number of children: 2  . Years of education: N/A   Social History Main Topics  . Smoking status: Never Smoker  . Smokeless tobacco: Never Used  . Alcohol use No  . Drug use: No  . Sexual activity: Not Asked   Other Topics Concern  . None   Social History Narrative  . None    Outpatient Encounter Prescriptions as of 05/07/2016    Medication Sig  . albuterol (PROVENTIL) (2.5 MG/3ML) 0.083% nebulizer solution Take 3 mLs (2.5 mg total) by nebulization every 4 (four) hours as needed for wheezing or shortness of breath.  Marland Kitchen amitriptyline (ELAVIL) 25 MG tablet TAKE ONE TABLET BY MOUTH EVERY DAY  . atorvastatin (LIPITOR) 10 MG tablet TAKE ONE TABLET BY MOUTH EVERY DAY  . carbamide peroxide (DEBROX) 6.5 % otic solution 3-4 drops in both ears q day.  Massage approximately 5 minutes  . insulin lispro (HUMALOG) 100 UNIT/ML injection As directed.  Dispense 4 vials.  Marland Kitchen levothyroxine (SYNTHROID, LEVOTHROID) 112 MCG tablet Take 112 mcg by mouth daily before breakfast.  . LYRICA 75 MG capsule TAKE 1 CAPSULE TWICE DAILY  . omeprazole (PRILOSEC) 20 MG capsule TAKE 1 CAPSULE BY MOUTH EVERY MORNING  . ranitidine (ZANTAC) 150 MG tablet Take 150 mg by mouth at bedtime.  . [DISCONTINUED] albuterol (PROVENTIL) (2.5 MG/3ML) 0.083% nebulizer solution Take 3 mLs (2.5 mg total) by nebulization every 4 (four) hours as needed for wheezing or shortness of breath.  Marland Kitchen azithromycin (ZITHROMAX) 250 MG tablet Take two tablets x 1 day and then one tablet per day for four more days.  . predniSONE (DELTASONE) 10 MG tablet Take 4 tablets x 1  day and then decrease by 1/2 tablet per day until down to zero mg.   No facility-administered encounter medications on file as of 05/07/2016.     Review of Systems  Constitutional: Negative for appetite change and fever.  HENT: Positive for congestion, postnasal drip, sinus pressure and sore throat.   Respiratory: Positive for cough. Negative for chest tightness and shortness of breath.        Cough productive of colored mucus.    Gastrointestinal: Negative for nausea and vomiting.  Skin: Negative for color change and rash.  Neurological: Negative for dizziness, light-headedness and headaches.       Objective:    Physical Exam  Constitutional: He appears well-developed and well-nourished. No distress.  HENT:   Nares - slightly erythematous turbinates.  No significant tenderness to palpation over the sinuses.    Cardiovascular: Normal rate and regular rhythm.   Pulmonary/Chest: Effort normal and breath sounds normal.  Increased cough with expiration and forced expiration.   Musculoskeletal: He exhibits no edema.  Skin: No rash noted. No erythema.  Psychiatric: He has a normal mood and affect. His behavior is normal.    BP 110/60   Pulse 87   Temp 97.9 F (36.6 C) (Oral)   Wt 204 lb 9.6 oz (92.8 kg)   SpO2 97%   BMI 32.04 kg/m  Wt Readings from Last 3 Encounters:  05/07/16 204 lb 9.6 oz (92.8 kg)  04/12/16 203 lb 12.8 oz (92.4 kg)  02/11/16 199 lb 8 oz (90.5 kg)     Lab Results  Component Value Date   WBC 8.9 08/19/2015   HGB 14.3 08/19/2015   HCT 44.2 08/19/2015   PLT 276.0 08/19/2015   GLUCOSE 148 (H) 02/10/2016   CHOL 126 02/10/2016   TRIG 150.0 (H) 02/10/2016   HDL 29.10 (L) 02/10/2016   LDLCALC 67 02/10/2016   ALT 25 02/10/2016   AST 19 02/10/2016   NA 140 02/10/2016   K 4.2 02/10/2016   CL 105 02/10/2016   CREATININE 0.98 02/10/2016   CREATININE 1.00 02/10/2016   BUN 15 02/10/2016   CO2 29 02/10/2016   TSH 0.73 02/10/2016   HGBA1C 7.3 (H) 02/10/2016   MICROALBUR <0.7 02/10/2016    Dg Chest 2 View  Addendum Date: 11/25/2015   ADDENDUM REPORT: 11/25/2015 13:21 ADDENDUM: Addendum created to address variation of findings and impression. Resolution of prior airspace disease on the left. IMPRESSION: Interval resolution of prior left-sided airspace disease/pneumonia. These results were discussed by telephone on 11/25/2015 at 1:20 pm to Dr. Einar Pheasant. Signed, Dulcy Fanny. Earleen Newport, DO [facility] Radiology Electronically Signed   By: Corrie Mckusick D.O.   On: 11/25/2015 13:21  Result Date: 11/25/2015 CLINICAL DATA:  40 year old male with a history of prior pneumonia 2 weeks prior EXAM: CHEST - 2 VIEW COMPARISON:  11/06/2015  FINDINGS: Cardiomediastinal silhouette projects within normal limits in size and contour. Interval resolution of airspace disease of the left mid lung. No confluent airspace disease on today's study. No pleural effusion or pneumothorax. No displaced fracture. Unremarkable appearance of the upper abdomen. IMPRESSION: Near complete resolution of prior pneumonia. Signed, Dulcy Fanny. Earleen Newport, DO [facility] Radiology Electronically Signed: By: Corrie Mckusick D.O. On: 11/24/2015 13:44       Assessment & Plan:   Problem List Items Addressed This Visit    Cough    Increased cough and congestion as outlined.  Refilled his albuterol for his neb.  Continue nebs.  Exam as outlined.  He wanted to give himself a neb at home.  Will hold on giving at the office.  Check cxr.  Treat with zpak as directed.  Prednisone taper as directed.  Follow sugars closely.  Stay hydrated.        Relevant Orders   DG Chest 2 View (Completed)   Type 1 diabetes mellitus (HCC)    Low carb diet and exercise.  Follow sugars closely on prednisone.         Other Visit Diagnoses    Wheezing    -  Primary   Relevant Orders   DG Chest 2 View (Completed)       Einar Pheasant, MD

## 2016-05-07 NOTE — Progress Notes (Signed)
Pre visit review using our clinic review tool, if applicable. No additional management support is needed unless otherwise documented below in the visit note. 

## 2016-05-07 NOTE — Patient Instructions (Signed)
Take the antibiotic and the prednisone as directed.    mucinex (or robitussin).    Saline nasal spray - flush nose at least 2-3x/day  nasacort nasal spray - 2 sprays each nostril one time per day.  Do this in the evening.    Take a probiotic while on the antibiotics and for two weeks after completing the antibiotic.

## 2016-05-08 ENCOUNTER — Encounter: Payer: Self-pay | Admitting: Internal Medicine

## 2016-05-08 NOTE — Assessment & Plan Note (Signed)
Increased cough and congestion as outlined.  Refilled his albuterol for his neb.  Continue nebs.  Exam as outlined.  He wanted to give himself a neb at home.  Will hold on giving at the office.  Check cxr.  Treat with zpak as directed.  Prednisone taper as directed.  Follow sugars closely.  Stay hydrated.

## 2016-05-08 NOTE — Assessment & Plan Note (Signed)
Low carb diet and exercise.  Follow sugars closely on prednisone.

## 2016-05-16 NOTE — Telephone Encounter (Signed)
Sent pt a my chart message regarding update on how he was feeling.

## 2016-08-13 ENCOUNTER — Encounter: Payer: BLUE CROSS/BLUE SHIELD | Admitting: Internal Medicine

## 2016-10-26 ENCOUNTER — Ambulatory Visit: Payer: BLUE CROSS/BLUE SHIELD | Admitting: Family

## 2017-01-06 LAB — HM DIABETES EYE EXAM

## 2017-01-11 LAB — HEMOGLOBIN A1C: HEMOGLOBIN A1C: 7.3

## 2017-04-29 ENCOUNTER — Other Ambulatory Visit: Payer: Self-pay | Admitting: Internal Medicine

## 2017-04-29 DIAGNOSIS — E039 Hypothyroidism, unspecified: Secondary | ICD-10-CM

## 2017-04-29 DIAGNOSIS — E7800 Pure hypercholesterolemia, unspecified: Secondary | ICD-10-CM

## 2017-04-29 DIAGNOSIS — R748 Abnormal levels of other serum enzymes: Secondary | ICD-10-CM

## 2017-04-29 DIAGNOSIS — E1040 Type 1 diabetes mellitus with diabetic neuropathy, unspecified: Secondary | ICD-10-CM

## 2017-05-16 ENCOUNTER — Ambulatory Visit (INDEPENDENT_AMBULATORY_CARE_PROVIDER_SITE_OTHER): Payer: BLUE CROSS/BLUE SHIELD | Admitting: Internal Medicine

## 2017-05-16 ENCOUNTER — Encounter: Payer: Self-pay | Admitting: Internal Medicine

## 2017-05-16 ENCOUNTER — Encounter: Payer: BLUE CROSS/BLUE SHIELD | Admitting: Internal Medicine

## 2017-05-16 VITALS — BP 124/74 | HR 87 | Temp 98.4°F | Resp 16 | Ht 67.0 in | Wt 195.2 lb

## 2017-05-16 DIAGNOSIS — K219 Gastro-esophageal reflux disease without esophagitis: Secondary | ICD-10-CM | POA: Diagnosis not present

## 2017-05-16 DIAGNOSIS — G629 Polyneuropathy, unspecified: Secondary | ICD-10-CM

## 2017-05-16 DIAGNOSIS — R42 Dizziness and giddiness: Secondary | ICD-10-CM | POA: Diagnosis not present

## 2017-05-16 DIAGNOSIS — R51 Headache: Secondary | ICD-10-CM | POA: Diagnosis not present

## 2017-05-16 DIAGNOSIS — R55 Syncope and collapse: Secondary | ICD-10-CM

## 2017-05-16 DIAGNOSIS — Z23 Encounter for immunization: Secondary | ICD-10-CM | POA: Diagnosis not present

## 2017-05-16 DIAGNOSIS — E7800 Pure hypercholesterolemia, unspecified: Secondary | ICD-10-CM

## 2017-05-16 DIAGNOSIS — G4733 Obstructive sleep apnea (adult) (pediatric): Secondary | ICD-10-CM | POA: Diagnosis not present

## 2017-05-16 DIAGNOSIS — E1040 Type 1 diabetes mellitus with diabetic neuropathy, unspecified: Secondary | ICD-10-CM

## 2017-05-16 DIAGNOSIS — R519 Headache, unspecified: Secondary | ICD-10-CM

## 2017-05-16 DIAGNOSIS — Z0000 Encounter for general adult medical examination without abnormal findings: Secondary | ICD-10-CM

## 2017-05-16 DIAGNOSIS — E039 Hypothyroidism, unspecified: Secondary | ICD-10-CM

## 2017-05-16 NOTE — Progress Notes (Signed)
Patient ID: Eric Hayden, male   DOB: 1976-06-01, 41 y.o   MRN: [id number]   Subjective:    Patient ID: Eric Hayden, male    DOB: 17-Jul-1976, 41 y.o.   MRN: [id number]  HPI  Patient here for his physical exam.  He reports he has noticed previously some intermittent light headedness in the am.  He would check sugar and it would be a little low.  He has a new insulin pump now.  Has not had significant lows since change.  No light headedness since the change.  Does report that approximately three weeks ago, he woke and felt a little dizzy. Checked his sugar and reports was a little low.  Ate something.  Continued to feel a little funny.   States sat down on the couch.  Reports next thing he remembers, he came to on the floor.  States approximately 45 minutes passed.  He called his wife, checked his sugar - normal.  Had no chest pain.  No sob.  No increased heart rate or palpitations.  States did have significant headache when came to.  Continues do have dull headache and feel washed out - the rest of the day.  No further episodes.  Increased stress.  He feels he is handling things relatively well.     Past Medical History:  Diagnosis Date  . Asthma   . Cancer ([place])   . Chicken pox   . GERD (gastroesophageal reflux disease)   . Heart murmur   . Hx of oral aphthous ulcers   . Hypothyroidism   . Migraines   . Type 1 diabetes mellitus ([place])    Past Surgical History:  Procedure Laterality Date  . APPENDECTOMY  2012  . TONSILLECTOMY AND ADENOIDECTOMY  2007   Family History  Problem Relation Age of Onset  . Arthritis Maternal Grandmother   . Hyperlipidemia Maternal Grandmother   . Heart disease Maternal Grandmother   . Hypertension Maternal Grandmother   . Diabetes Maternal Grandmother        type 2  . Arthritis Maternal Grandfather   . Heart disease Maternal Grandfather   . Hypertension Maternal Grandfather   . Arthritis Mother   . Hyperlipidemia Mother   . Hypertension Mother   .  Diabetes Mother        type 2  . Hyperlipidemia Father   . Arthritis Paternal Grandmother   . Arthritis Paternal Grandfather    Social History   Social History  . Marital status: Married    Spouse name: N/A  . Number of children: 2  . Years of education: N/A   Social History Main Topics  . Smoking status: Never Smoker  . Smokeless tobacco: Never Used  . Alcohol use No  . Drug use: No  . Sexual activity: Not Asked   Other Topics Concern  . None   Social History Narrative  . None    Outpatient Encounter Prescriptions as of 05/16/2017  Medication Sig  . amitriptyline (ELAVIL) 25 MG tablet TAKE ONE TABLET BY MOUTH EVERY DAY  . atorvastatin (LIPITOR) 10 MG tablet TAKE ONE TABLET BY MOUTH EVERY DAY  . carbamide peroxide (DEBROX) 6.5 % otic solution 3-4 drops in both ears q day.  Massage approximately 5 minutes  . gabapentin (NEURONTIN) 300 MG capsule Start 365m tablet at bedtime. Titrate as needed to max 2 tabs twice daily.  . insulin lispro (HUMALOG) 100 UNIT/ML injection As directed.  Dispense 4 vials.  .Marland Kitchenlevothyroxine (SYNTHROID, LEVOTHROID)  112 MCG tablet Take 112 mcg by mouth daily before breakfast.  . omeprazole (PRILOSEC) 20 MG capsule TAKE 1 CAPSULE BY MOUTH EVERY MORNING  . ranitidine (ZANTAC) 150 MG tablet Take 150 mg by mouth at bedtime.  . [DISCONTINUED] albuterol (PROVENTIL) (2.5 MG/3ML) 0.083% nebulizer solution Take 3 mLs (2.5 mg total) by nebulization every 4 (four) hours as needed for wheezing or shortness of breath. (Patient not taking: Reported on 05/16/2017)  . [DISCONTINUED] azithromycin (ZITHROMAX) 250 MG tablet Take two tablets x 1 day and then one tablet per day for four more days. (Patient not taking: Reported on 05/16/2017)  . [DISCONTINUED] LYRICA 75 MG capsule TAKE 1 CAPSULE TWICE DAILY (Patient not taking: Reported on 05/16/2017)  . [DISCONTINUED] predniSONE (DELTASONE) 10 MG tablet Take 4 tablets x 1 day and then decrease by 1/2 tablet per day until  down to zero mg. (Patient not taking: Reported on 05/16/2017)   No facility-administered encounter medications on file as of 05/16/2017.     Review of Systems  Constitutional: Negative for appetite change and unexpected weight change.  HENT: Negative for congestion and sinus pressure.        No significant allergy symptoms.    Respiratory: Negative for cough, chest tightness and shortness of breath.   Cardiovascular: Negative for chest pain, palpitations and leg swelling.  Gastrointestinal: Negative for abdominal pain, diarrhea, nausea and vomiting.  Genitourinary: Negative for difficulty urinating and dysuria.  Musculoskeletal: Negative for back pain and joint swelling.  Skin: Negative for color change and rash.  Neurological:       Previous syncopal episode.  Headache after.  No headache since.  No significant light headedness or dizziness over the last 2-3 weeks.    Psychiatric/Behavioral: Negative for agitation and dysphoric mood.       Objective:    Physical Exam  Constitutional: He is oriented to person, place, and time. He appears well-developed and well-nourished. No distress.  HENT:  Head: Normocephalic and atraumatic.  Nose: Nose normal.  Mouth/Throat: Oropharynx is clear and moist. No oropharyngeal exudate.  Eyes: Conjunctivae are normal. Right eye exhibits no discharge. Left eye exhibits no discharge.  Neck: Neck supple. No thyromegaly present.  Cardiovascular: Normal rate and regular rhythm.   Pulmonary/Chest: Breath sounds normal. No respiratory distress. He has no wheezes.  Abdominal: Soft. Bowel sounds are normal. There is no tenderness.  Genitourinary:  Genitourinary Comments: Not performed.    Musculoskeletal: He exhibits no edema or tenderness.  Lymphadenopathy:    He has no cervical adenopathy.  Neurological: He is alert and oriented to person, place, and time.  Skin: Skin is warm and dry. No rash noted. No erythema.  Psychiatric: He has a normal mood and  affect. His behavior is normal.    BP 124/74 (BP Location: Left Arm, Patient Position: Sitting, Cuff Size: Normal)   Pulse 87   Temp 98.4 F (36.9 C) (Oral)   Resp 16   Ht 5' 7"  (1.702 m)   Wt 195 lb 4 oz (88.6 kg)   SpO2 97%   BMI 30.58 kg/m  Wt Readings from Last 3 Encounters:  05/16/17 195 lb 4 oz (88.6 kg)  05/07/16 204 lb 9.6 oz (92.8 kg)  04/12/16 203 lb 12.8 oz (92.4 kg)     Lab Results  Component Value Date   WBC 8.9 08/19/2015   HGB 14.3 08/19/2015   HCT 44.2 08/19/2015   PLT 276.0 08/19/2015   GLUCOSE 148 (H) 02/10/2016   CHOL 126 02/10/2016  TRIG 150.0 (H) 02/10/2016   HDL 29.10 (L) 02/10/2016   LDLCALC 67 02/10/2016   ALT 25 02/10/2016   AST 19 02/10/2016   NA 140 02/10/2016   K 4.2 02/10/2016   CL 105 02/10/2016   CREATININE 0.98 02/10/2016   CREATININE 1.00 02/10/2016   BUN 15 02/10/2016   CO2 29 02/10/2016   TSH 0.73 02/10/2016   HGBA1C 7.3 01/11/2017   MICROALBUR <0.7 02/10/2016    Dg Chest 2 View  Addendum Date: 11/25/2015   ADDENDUM REPORT: 11/25/2015 13:21 ADDENDUM: Addendum created to address variation of findings and impression. Resolution of prior airspace disease on the left. IMPRESSION: Interval resolution of prior left-sided airspace disease/pneumonia. These results were discussed by telephone on 11/25/2015 at 1:20 pm to Dr. Einar Pheasant. Signed, Dulcy Fanny. Earleen Newport, DO [facility] Radiology Electronically Signed   By: Corrie Mckusick D.O.   On: 11/25/2015 13:21  Result Date: 11/25/2015 CLINICAL DATA:  41 year old male with a history of prior pneumonia 2 weeks prior EXAM: CHEST - 2 VIEW COMPARISON:  11/06/2015 FINDINGS: Cardiomediastinal silhouette projects within normal limits in size and contour. Interval resolution of airspace disease of the left mid lung. No confluent airspace disease on today's study. No pleural effusion or pneumothorax. No displaced fracture. Unremarkable appearance of the  upper abdomen. IMPRESSION: Near complete resolution of prior pneumonia. Signed, Dulcy Fanny. Earleen Newport, DO [facility] Radiology Electronically Signed: By: Corrie Mckusick D.O. On: 11/24/2015 13:44       Assessment & Plan:   Problem List Items Addressed This Visit    Dizziness    Previous intermittent dizziness/light headedness.  Saw ENT.  Negative initial w/up.  Has been attributed to low sugars.  Is better recently with change in insulin pump.  Obtain head scan as outlined.        GERD (gastroesophageal reflux disease)    Controlled on omeprazole.        Headache    Had headache after syncopal episode as outlined.  Unclear etiology.  He denies head injury.  No headache since.  Discussed with neurology.  Will obtain head scan.  Schedule f/u with neurology.        Relevant Medications   gabapentin (NEURONTIN) 300 MG capsule   Health care maintenance    Physical today 05/16/17.  Check psa with next labs.        Hypercholesterolemia    On lipitor.  Low cholesterol diet and exercise.  Follow lipid panel and liver function tests.        Hypothyroidism    On thyroid replacement.  Follow tsh.        Neuropathy    Has neuropathic chest pain. On gabapentin now.  Stable.        Obstructive sleep apnea    CPAP.       Syncope - Primary    Passed out recently as outlined.  Unclear etiology.  Possibly could be related to his sugars, but states was out for 45 minutes.  No chest pain.  No increased heart rate or palpitations. EKG today - SR with no acute ischemic changes.   Discussed with neurology.  Will obtain head scan.  Will arrange f/u with neurology for further evaluation to try and determine etiology.  Has had no recurring episodes.        Relevant Orders   EKG 12-Lead (Completed)   Type 1 diabetes mellitus (HCC)    New insulin pump.  Getting  adjusted.  No significant low sugars since new pump.  Low carb diet and exercise.  Follow met b  and a1c.  Up to date with eye checks.         Other Visit Diagnoses    Need for immunization against influenza       Relevant Orders   Flu Vaccine QUAD 36+ mos IM (Completed)       Einar Pheasant, MD

## 2017-05-17 ENCOUNTER — Encounter: Payer: Self-pay | Admitting: Internal Medicine

## 2017-05-17 ENCOUNTER — Other Ambulatory Visit (INDEPENDENT_AMBULATORY_CARE_PROVIDER_SITE_OTHER): Payer: BLUE CROSS/BLUE SHIELD

## 2017-05-17 DIAGNOSIS — E039 Hypothyroidism, unspecified: Secondary | ICD-10-CM

## 2017-05-17 DIAGNOSIS — R55 Syncope and collapse: Secondary | ICD-10-CM | POA: Insufficient documentation

## 2017-05-17 DIAGNOSIS — E1040 Type 1 diabetes mellitus with diabetic neuropathy, unspecified: Secondary | ICD-10-CM

## 2017-05-17 DIAGNOSIS — R748 Abnormal levels of other serum enzymes: Secondary | ICD-10-CM

## 2017-05-17 DIAGNOSIS — E7800 Pure hypercholesterolemia, unspecified: Secondary | ICD-10-CM | POA: Diagnosis not present

## 2017-05-17 LAB — CBC WITH DIFFERENTIAL/PLATELET
BASOS PCT: 1 % (ref 0.0–3.0)
Basophils Absolute: 0.1 10*3/uL (ref 0.0–0.1)
EOS PCT: 7.3 % — AB (ref 0.0–5.0)
Eosinophils Absolute: 0.6 10*3/uL (ref 0.0–0.7)
HEMATOCRIT: 42.2 % (ref 39.0–52.0)
HEMOGLOBIN: 14.3 g/dL (ref 13.0–17.0)
LYMPHS PCT: 21.7 % (ref 12.0–46.0)
Lymphs Abs: 1.9 10*3/uL (ref 0.7–4.0)
MCHC: 33.8 g/dL (ref 30.0–36.0)
MCV: 84.9 fl (ref 78.0–100.0)
MONO ABS: 0.6 10*3/uL (ref 0.1–1.0)
MONOS PCT: 6.7 % (ref 3.0–12.0)
Neutro Abs: 5.4 10*3/uL (ref 1.4–7.7)
Neutrophils Relative %: 63.3 % (ref 43.0–77.0)
Platelets: 265 10*3/uL (ref 150.0–400.0)
RBC: 4.98 Mil/uL (ref 4.22–5.81)
RDW: 14.2 % (ref 11.5–15.5)
WBC: 8.6 10*3/uL (ref 4.0–10.5)

## 2017-05-17 LAB — COMPREHENSIVE METABOLIC PANEL
ALK PHOS: 107 U/L (ref 39–117)
ALT: 16 U/L (ref 0–53)
AST: 14 U/L (ref 0–37)
Albumin: 4.1 g/dL (ref 3.5–5.2)
BUN: 12 mg/dL (ref 6–23)
CALCIUM: 9.3 mg/dL (ref 8.4–10.5)
CHLORIDE: 102 meq/L (ref 96–112)
CO2: 27 mEq/L (ref 19–32)
Creatinine, Ser: 1.06 mg/dL (ref 0.40–1.50)
GFR: 81.99 mL/min (ref >60.00)
Glucose, Bld: 168 mg/dL — ABNORMAL HIGH (ref 70–99)
POTASSIUM: 4.6 meq/L (ref 3.5–5.1)
Sodium: 140 mEq/L (ref 135–145)
TOTAL PROTEIN: 6.8 g/dL (ref 6.0–8.3)
Total Bilirubin: 0.5 mg/dL (ref 0.2–1.2)

## 2017-05-17 LAB — LIPID PANEL
CHOLESTEROL: 123 mg/dL (ref 0–200)
HDL: 31.5 mg/dL — AB (ref >39.00)
LDL Cholesterol: 72 mg/dL (ref 0–99)
NONHDL: 91.47
TRIGLYCERIDES: 98 mg/dL (ref 0.0–149.0)
Total CHOL/HDL Ratio: 4
VLDL: 19.6 mg/dL (ref 0.0–40.0)

## 2017-05-17 LAB — HEMOGLOBIN A1C: Hgb A1c MFr Bld: 6.9 % — ABNORMAL HIGH (ref 4.6–6.5)

## 2017-05-17 LAB — TSH: TSH: 1.4 u[IU]/mL (ref 0.35–4.50)

## 2017-05-17 NOTE — Assessment & Plan Note (Signed)
New insulin pump.  Getting adjusted.  No significant low sugars since new pump.  Low carb diet and exercise.  Follow met b and a1c.  Up to date with eye checks.

## 2017-05-17 NOTE — Assessment & Plan Note (Signed)
Passed out recently as outlined.  Unclear etiology.  Possibly could be related to his sugars, but states was out for 45 minutes.  No chest pain.  No increased heart rate or palpitations. EKG today - SR with no acute ischemic changes.   Discussed with neurology.  Will obtain head scan.  Will arrange f/u with neurology for further evaluation to try and determine etiology.  Has had no recurring episodes.

## 2017-05-17 NOTE — Assessment & Plan Note (Signed)
CPAP.  

## 2017-05-17 NOTE — Assessment & Plan Note (Signed)
Controlled on omeprazole.

## 2017-05-17 NOTE — Assessment & Plan Note (Signed)
Physical today 05/16/17.  Check psa with next labs.

## 2017-05-17 NOTE — Assessment & Plan Note (Signed)
Previous intermittent dizziness/light headedness.  Saw ENT.  Negative initial w/up.  Has been attributed to low sugars.  Is better recently with change in insulin pump.  Obtain head scan as outlined.

## 2017-05-17 NOTE — Assessment & Plan Note (Signed)
Had headache after syncopal episode as outlined.  Unclear etiology.  He denies head injury.  No headache since.  Discussed with neurology.  Will obtain head scan.  Schedule f/u with neurology.

## 2017-05-17 NOTE — Assessment & Plan Note (Signed)
On lipitor.  Low cholesterol diet and exercise.  Follow lipid panel and liver function tests.   

## 2017-05-17 NOTE — Assessment & Plan Note (Signed)
Has neuropathic chest pain. On gabapentin now.  Stable.

## 2017-05-17 NOTE — Assessment & Plan Note (Signed)
On thyroid replacement.  Follow tsh.  

## 2017-05-18 LAB — MICROALBUMIN / CREATININE URINE RATIO
CREATININE, U: 179.6 mg/dL
MICROALB/CREAT RATIO: 0.4 mg/g (ref 0.0–30.0)
Microalb, Ur: 0.8 mg/dL (ref 0.0–1.9)

## 2017-05-22 ENCOUNTER — Other Ambulatory Visit: Payer: Self-pay | Admitting: Internal Medicine

## 2017-05-22 DIAGNOSIS — R404 Transient alteration of awareness: Secondary | ICD-10-CM | POA: Insufficient documentation

## 2017-05-22 NOTE — Progress Notes (Signed)
Order placed for MRI brain.

## 2017-05-23 ENCOUNTER — Telehealth: Payer: Self-pay | Admitting: Internal Medicine

## 2017-05-23 NOTE — Telephone Encounter (Signed)
[facility] called, pt needs to remove pump and sensor before MRI. Clinic will call patient and let him know about this.

## 2017-05-23 NOTE — Telephone Encounter (Signed)
FYI

## 2017-05-23 NOTE — Telephone Encounter (Signed)
I have already informed pt as well.

## 2017-05-30 ENCOUNTER — Encounter: Payer: Self-pay | Admitting: Internal Medicine

## 2017-06-01 NOTE — Telephone Encounter (Signed)
Yes, it was scheduled yesterday and a separate mychart message was sent to him with appt information. It is scheduled for this Saturday so he will have the results before he see's dr. Manuella Ghazi.

## 2017-06-03 ENCOUNTER — Ambulatory Visit
Admission: RE | Admit: 2017-06-03 | Discharge: 2017-06-03 | Disposition: A | Discharge status: Home / Self Care | Payer: BLUE CROSS/BLUE SHIELD | Source: Ambulatory Visit | Attending: Internal Medicine | Admitting: Internal Medicine

## 2017-06-03 DIAGNOSIS — R404 Transient alteration of awareness: Secondary | ICD-10-CM | POA: Diagnosis not present

## 2017-06-03 MED ORDER — GADOBENATE DIMEGLUMINE 529 MG/ML IV SOLN
18.0000 mL | Freq: Once | INTRAVENOUS | Status: AC | PRN
  Administered 2017-06-03: 18 mL via INTRAVENOUS

## 2017-06-04 ENCOUNTER — Ambulatory Visit: Payer: BLUE CROSS/BLUE SHIELD

## 2017-06-05 ENCOUNTER — Encounter: Payer: Self-pay | Admitting: Internal Medicine

## 2017-07-11 ENCOUNTER — Ambulatory Visit: Payer: BLUE CROSS/BLUE SHIELD | Admitting: Cardiovascular Disease

## 2017-07-12 NOTE — Progress Notes (Signed)
Cardiology Office Note  Date:  07/13/2017   ID:  Eric Hayden, DOB February 22, 1976, MRN [id number]  PCP:  Eric Pheasant, MD   Chief Complaint  Patient presents with  . other    Syncopal episode. Meds reviewed verbally with pt.    HPI:  [name] is a 41 year old gentleman with history of DM,type 1, uses a pump and sensor gerd Asthma Nonsmoker Hyperlipidemia Previous intermittent dizziness/light headedness.  Saw ENT.  Negative initial w/up.  Has been attributed to low sugars.  Who presents by referral from Eric Hayden for syncope x2  Reports having 2 episodes presenting in the morning, Frequent dizziness when he first wakes up, worse with sitting up at the side of the bed, worse with standing 2 episodes have led to complete syncope One episode feels he had lost consciousness for 45 minutes No witnesses, wife was already at work  He has been seen by neurology, Scheduled to have EEG  Completed MRI, no acute findings, no stroke  Dizziness will often go away less than a minute He did check his sugars, they were fine Does not have a blood pressure cuff.  He does have a fit bit but has not been checking his heart rate when he is dizzy  One episode passed out while sitting on the couch  Reports next thing he remembers, he came to on the floor.   checked his sugar - normal.  Otherwise very active at baseline, denies any palpitations, tachycardia, shortness of breath on exertion  EKG personally reviewed by myself on todays visit Shows normal sinus rhythm rate 86 bpm no significant ST or T wave changes    PMH:   has a past medical history of Asthma, Cancer ([place]), Chicken pox, GERD (gastroesophageal reflux disease), Heart murmur, oral aphthous ulcers, Hypothyroidism, Migraines, and Type 1 diabetes mellitus ([place]).  PSH:    Past Surgical History:  Procedure Laterality Date  . APPENDECTOMY  2012  . TONSILLECTOMY AND ADENOIDECTOMY  2007    Current Outpatient  Medications  Medication Sig Dispense Refill  . amitriptyline (ELAVIL) 25 MG tablet TAKE ONE TABLET BY MOUTH EVERY DAY 30 tablet 2  . atorvastatin (LIPITOR) 10 MG tablet TAKE ONE TABLET BY MOUTH EVERY DAY 30 tablet 6  . carbamide peroxide (DEBROX) 6.5 % otic solution 3-4 drops in both ears q day.  Massage approximately 5 minutes 15 mL 0  . gabapentin (NEURONTIN) 300 MG capsule Start 321m tablet at bedtime. Titrate as needed to max 2 tabs twice daily.    . insulin lispro (HUMALOG) 100 UNIT/ML injection As directed.  Dispense 4 vials. 10 mL 5  . levothyroxine (SYNTHROID, LEVOTHROID) 112 MCG tablet Take 112 mcg by mouth daily before breakfast.    . omeprazole (PRILOSEC) 20 MG capsule TAKE 1 CAPSULE BY MOUTH EVERY MORNING 30 capsule 4  . ranitidine (ZANTAC) 150 MG tablet Take 150 mg by mouth at bedtime.     No current facility-administered medications for this visit.      Allergies:   Augmentin [amoxicillin-pot clavulanate] and Levaquin [levofloxacin in d5w]   Social History:  The patient  reports that  has never smoked. he has never used smokeless tobacco. He reports that he does not drink alcohol or use drugs.   Family History:   family history includes Arthritis in his maternal grandfather, maternal grandmother, mother, paternal grandfather, and paternal grandmother; Diabetes in his maternal grandmother and mother; Heart attack in his maternal grandfather, maternal grandmother, and paternal aunt; Heart disease  in his maternal grandfather and maternal grandmother; Hyperlipidemia in his father, maternal grandmother, and mother; Hypertension in his maternal grandfather, maternal grandmother, and mother.    Review of Systems: Review of Systems  Constitutional: Negative.   Respiratory: Negative.   Cardiovascular: Negative.   Gastrointestinal: Negative.   Musculoskeletal: Negative.   Neurological: Positive for loss of consciousness.  Psychiatric/Behavioral: Negative.   All other systems  reviewed and are negative.    PHYSICAL EXAM: VS:  BP 128/80 (BP Location: Right Arm, Patient Position: Sitting, Cuff Size: Normal)   Pulse 86   Ht 5' 7"  (1.702 m)   Wt 197 lb 12 oz (89.7 kg)   BMI 30.97 kg/m  , BMI Body mass index is 30.97 kg/m. GEN: Well nourished, well developed, in no acute distress  HEENT: normal  Neck: no JVD, carotid bruits, or masses Cardiac: RRR; no murmurs, rubs, or gallops,no edema  Respiratory:  clear to auscultation bilaterally, normal work of breathing GI: soft, nontender, nondistended, + BS MS: no deformity or atrophy  Skin: warm and dry, no rash Neuro:  Strength and sensation are intact Psych: euthymic mood, full affect    Recent Labs: 05/17/2017: ALT 16; BUN 12; Creatinine, Ser 1.06; Hemoglobin 14.3; Platelets 265.0; Potassium 4.6; Sodium 140; TSH 1.40    Lipid Panel Lab Results  Component Value Date   CHOL 123 05/17/2017   HDL 31.50 (L) 05/17/2017   LDLCALC 72 05/17/2017   TRIG 98.0 05/17/2017      Wt Readings from Last 3 Encounters:  07/13/17 197 lb 12 oz (89.7 kg)  07/13/17 197 lb 6.4 oz (89.5 kg)  05/16/17 195 lb 4 oz (88.6 kg)       ASSESSMENT AND PLAN:  Syncope, unspecified syncope type - Plan: EKG 12-Lead Etiology of his episodes of dizziness and syncope is unclear Unable to exclude orthostasis.  Numbers do not confirm this today Happens in the morning when he first wake up, no problems later in the day One episode happened while he was sitting Recommended when he is dizzy that he should monitor heart rate using his fit bit and using pulse tracker on his phone.  Would also recommend he buy a blood pressure cuff and check blood pressure and heart rate when he has symptoms. We did discuss doing a 30-day monitor if he does have concerning heart rate or blood pressure Recent neurologic workup unrevealing Less likely ischemia given no symptoms with activity, no shortness of breath or chest discomfort concerning for  angina EKG normal, clinical exam normal.  Less likely underlying structural heart disease He does not feel it is from sugars as sugar numbers have been the normal range after an episode  Hypercholesterolemia Tolerating Lipitor 10 mg daily, lipids at goal  Type 1 diabetes mellitus with diabetic neuropathy ([place]) We have encouraged continued exercise, careful diet management in an effort to lose weight.  Dizziness Plan as above,  He will call with blood pressure and heart rate numbers when he has symptoms Might need 2-week or 30-day monitor   Disposition:   F/U  As needed  Patient was seen in consultation for Dr. Nicki Reaper and will be referred back to her office for ongoing care of the issues detailed above   Total encounter time more than 60 minutes  Greater than 50% was spent in counseling and coordination of care with the patient    Orders Placed This Encounter  Procedures  . EKG 12-Lead     Signed, Esmond Plants, M.D., Ph.D. 07/13/2017  [place], [place]

## 2017-07-13 ENCOUNTER — Encounter: Payer: Self-pay | Admitting: Internal Medicine

## 2017-07-13 ENCOUNTER — Ambulatory Visit: Payer: BLUE CROSS/BLUE SHIELD | Admitting: Cardiovascular Disease

## 2017-07-13 ENCOUNTER — Ambulatory Visit: Payer: BLUE CROSS/BLUE SHIELD | Admitting: Internal Medicine

## 2017-07-13 ENCOUNTER — Encounter: Payer: Self-pay | Admitting: Cardiovascular Disease

## 2017-07-13 VITALS — BP 128/80 | HR 86 | Ht 67.0 in | Wt 197.8 lb

## 2017-07-13 DIAGNOSIS — G629 Polyneuropathy, unspecified: Secondary | ICD-10-CM | POA: Diagnosis not present

## 2017-07-13 DIAGNOSIS — K219 Gastro-esophageal reflux disease without esophagitis: Secondary | ICD-10-CM | POA: Diagnosis not present

## 2017-07-13 DIAGNOSIS — E1040 Type 1 diabetes mellitus with diabetic neuropathy, unspecified: Secondary | ICD-10-CM

## 2017-07-13 DIAGNOSIS — R42 Dizziness and giddiness: Secondary | ICD-10-CM | POA: Diagnosis not present

## 2017-07-13 DIAGNOSIS — R55 Syncope and collapse: Secondary | ICD-10-CM

## 2017-07-13 DIAGNOSIS — G4733 Obstructive sleep apnea (adult) (pediatric): Secondary | ICD-10-CM

## 2017-07-13 DIAGNOSIS — E7800 Pure hypercholesterolemia, unspecified: Secondary | ICD-10-CM

## 2017-07-13 DIAGNOSIS — E039 Hypothyroidism, unspecified: Secondary | ICD-10-CM | POA: Diagnosis not present

## 2017-07-13 NOTE — Progress Notes (Signed)
Patient ID: Eric Hayden, male   DOB: 08-15-1975, 41 y.o.   MRN: [id number]   Subjective:    Patient ID: Eric Hayden, male    DOB: Dec 31, 1975, 41 y.o.   MRN: [id number]  HPI  Patient here for a scheduled follow up.  States he is doing better.  Feels better.  Staying active.  No chest pain.  No sob.  Has not has any further syncopal episodes.  Has had a couple of episodes of dizziness since last visit.  Occurred in am.  Blood sugar 70.  Ate.  Slept for one hour and felt better.  Just had residual minimal headache.  Saw neurology.  Note reviewed.  Scheduled for EEG tomorrow.  Due to see Dr Rockey Situ today.  Sugars overall doing better.  New insuline pump.  Getting more used to his pump.  No nausea or vomiting.  Bowels moving.  Has f/u with endocrinology next week.     Past Medical History:  Diagnosis Date  . Asthma   . Cancer ([place])   . Chicken pox   . GERD (gastroesophageal reflux disease)   . Heart murmur   . Hx of oral aphthous ulcers   . Hypothyroidism   . Migraines   . Type 1 diabetes mellitus ([place])    Past Surgical History:  Procedure Laterality Date  . APPENDECTOMY  2012  . TONSILLECTOMY AND ADENOIDECTOMY  2007   Family History  Problem Relation Age of Onset  . Arthritis Maternal Grandmother   . Hyperlipidemia Maternal Grandmother   . Heart disease Maternal Grandmother   . Hypertension Maternal Grandmother   . Diabetes Maternal Grandmother        type 2  . Heart attack Maternal Grandmother   . Arthritis Maternal Grandfather   . Heart disease Maternal Grandfather   . Hypertension Maternal Grandfather   . Heart attack Maternal Grandfather   . Arthritis Mother   . Hyperlipidemia Mother   . Hypertension Mother   . Diabetes Mother        type 2  . Hyperlipidemia Father   . Arthritis Paternal Grandmother   . Arthritis Paternal Grandfather   . Heart attack Paternal Aunt    Social History   Socioeconomic History  . Marital status: Married    Spouse name: None  .  Number of children: 2  . Years of education: None  . Highest education level: None  Social Needs  . Financial resource strain: None  . Food insecurity - worry: None  . Food insecurity - inability: None  . Transportation needs - medical: None  . Transportation needs - non-medical: None  Occupational History  . None  Tobacco Use  . Smoking status: Never Smoker  . Smokeless tobacco: Never Used  Substance and Sexual Activity  . Alcohol use: No    Alcohol/week: 0.0 oz  . Drug use: No  . Sexual activity: None  Other Topics Concern  . None  Social History Narrative  . None    Outpatient Encounter Medications as of 07/13/2017  Medication Sig  . amitriptyline (ELAVIL) 25 MG tablet TAKE ONE TABLET BY MOUTH EVERY DAY  . atorvastatin (LIPITOR) 10 MG tablet TAKE ONE TABLET BY MOUTH EVERY DAY  . carbamide peroxide (DEBROX) 6.5 % otic solution 3-4 drops in both ears q day.  Massage approximately 5 minutes  . gabapentin (NEURONTIN) 300 MG capsule Start 341m tablet at bedtime. Titrate as needed to max 2 tabs twice daily.  . insulin lispro (HUMALOG) 100  UNIT/ML injection As directed.  Dispense 4 vials.  Marland Kitchen levothyroxine (SYNTHROID, LEVOTHROID) 112 MCG tablet Take 112 mcg by mouth daily before breakfast.  . omeprazole (PRILOSEC) 20 MG capsule TAKE 1 CAPSULE BY MOUTH EVERY MORNING  . ranitidine (ZANTAC) 150 MG tablet Take 150 mg by mouth at bedtime.   No facility-administered encounter medications on file as of 07/13/2017.     Review of Systems  Constitutional: Negative for appetite change and unexpected weight change.  HENT: Negative for congestion and sinus pressure.   Respiratory: Negative for cough, chest tightness and shortness of breath.   Cardiovascular: Negative for chest pain, palpitations and leg swelling.  Gastrointestinal: Negative for abdominal pain, diarrhea, nausea and vomiting.  Genitourinary: Negative for difficulty urinating and dysuria.  Musculoskeletal: Negative for  joint swelling and myalgias.  Skin: Negative for color change and rash.  Neurological: Positive for dizziness. Negative for headaches.  Psychiatric/Behavioral: Negative for agitation and dysphoric mood.       Objective:     Blood pressure rechecked by me:  126/78  Physical Exam  Constitutional: He appears well-developed and well-nourished. No distress.  HENT:  Nose: Nose normal.  Mouth/Throat: Oropharynx is clear and moist.  Neck: Neck supple. No thyromegaly present.  Cardiovascular: Normal rate and regular rhythm.  Pulmonary/Chest: Effort normal and breath sounds normal. No respiratory distress.  Abdominal: Soft. Bowel sounds are normal. There is no tenderness.  Musculoskeletal: He exhibits no edema or tenderness.  Lymphadenopathy:    He has no cervical adenopathy.  Skin: No rash noted. No erythema.  Psychiatric: He has a normal mood and affect. His behavior is normal.    BP 130/84 (BP Location: Right Arm, Patient Position: Sitting, Cuff Size: Normal)   Pulse 81   Temp 98.3 F (36.8 C) (Oral)   Wt 197 lb 6.4 oz (89.5 kg)   BMI 30.92 kg/m  Wt Readings from Last 3 Encounters:  07/13/17 197 lb 12 oz (89.7 kg)  07/13/17 197 lb 6.4 oz (89.5 kg)  05/16/17 195 lb 4 oz (88.6 kg)     Lab Results  Component Value Date   WBC 8.6 05/17/2017   HGB 14.3 05/17/2017   HCT 42.2 05/17/2017   PLT 265.0 05/17/2017   GLUCOSE 168 (H) 05/17/2017   CHOL 123 05/17/2017   TRIG 98.0 05/17/2017   HDL 31.50 (L) 05/17/2017   LDLCALC 72 05/17/2017   ALT 16 05/17/2017   AST 14 05/17/2017   NA 140 05/17/2017   K 4.6 05/17/2017   CL 102 05/17/2017   CREATININE 1.06 05/17/2017   BUN 12 05/17/2017   CO2 27 05/17/2017   TSH 1.40 05/17/2017   HGBA1C 6.9 (H) 05/17/2017   MICROALBUR 0.8 05/17/2017    Mr Brain W Wo Contrast  Result Date: 06/03/2017 CLINICAL DATA:  Two episodes of syncope within the last month. EXAM: MRI HEAD WITHOUT AND WITH CONTRAST TECHNIQUE: Multiplanar, multiecho pulse  sequences of the brain and surrounding structures were obtained without and with intravenous contrast. CONTRAST:  58m MULTIHANCE GADOBENATE DIMEGLUMINE 529 MG/ML IV SOLN COMPARISON:  Head CT 05/18/2014. FINDINGS: Brain: The brain has normal appearance without evidence of malformation, atrophy, old or acute small or large vessel infarction, hemorrhage, hydrocephalus or extra-axial collection. No pituitary abnormality. After contrast administration, no abnormal enhancement occurs. Vascular: Major vessels at the base of the brain show flow. Skull and upper cervical spine: Normal Sinuses/Orbits: Clear/ normal. Other: None significant. IMPRESSION: Normal examination.  No abnormality seen to explain syncope. Electronically Signed  By: Nelson Chimes M.D.   On: 06/03/2017 09:19       Assessment & Plan:   Problem List Items Addressed This Visit    GERD (gastroesophageal reflux disease)    Controlled on omeprazole.        Hypercholesterolemia    On lipitor.  Low cholesterol diet and exercise.  Follow lipid panel and liver function tests.        Hypothyroidism    On thyroid replacement.  Follow tsh.        Neuropathy    Has neuropathic chest pain.  On gabapentin.  Stable.        Obstructive sleep apnea    CPAP.       Syncope    Has had no further syncopal episodes.  A couple of episodes of dizziness as outlined.  Saw neurology.  Planning for EEG tomorrow.  Due to see cardiology today.  Stay hydrated.  Eat regular meals.  Monitor sugars.        Type 1 diabetes mellitus ([facility])    New insuline pump.  Seeing endocrinology.  Sugars better.  Follow met b and a1c.  Has f/u with endocrinology next week.            Einar Pheasant, MD

## 2017-07-13 NOTE — Patient Instructions (Addendum)
Monitor heart rate and blood pressure when having sx Sitting and standing  Medication Instructions:   No medication changes made  Labwork:  No new labs needed  Testing/Procedures:  No further testing at this time    Follow-Up: It was a pleasure seeing you in the office today. Please call us if you have new issues that need to be addressed before your next appt.  [phone]  Your physician wants you to follow-up in: As needed  If you need a refill on your cardiac medications before your next appointment, please call your pharmacy.

## 2017-07-16 ENCOUNTER — Encounter: Payer: Self-pay | Admitting: Internal Medicine

## 2017-07-16 NOTE — Assessment & Plan Note (Signed)
Has had no further syncopal episodes.  A couple of episodes of dizziness as outlined.  Saw neurology.  Planning for EEG tomorrow.  Due to see cardiology today.  Stay hydrated.  Eat regular meals.  Monitor sugars.

## 2017-07-16 NOTE — Assessment & Plan Note (Signed)
CPAP.  

## 2017-07-16 NOTE — Assessment & Plan Note (Signed)
On thyroid replacement.  Follow tsh.  

## 2017-07-16 NOTE — Assessment & Plan Note (Signed)
Has neuropathic chest pain.  On gabapentin.  Stable.

## 2017-07-16 NOTE — Assessment & Plan Note (Signed)
New insuline pump.  Seeing endocrinology.  Sugars better.  Follow met b and a1c.  Has f/u with endocrinology next week.

## 2017-07-16 NOTE — Assessment & Plan Note (Signed)
On lipitor.  Low cholesterol diet and exercise.  Follow lipid panel and liver function tests.   

## 2017-07-16 NOTE — Assessment & Plan Note (Signed)
Controlled on omeprazole.

## 2017-12-13 ENCOUNTER — Ambulatory Visit: Payer: BLUE CROSS/BLUE SHIELD | Admitting: Internal Medicine

## 2017-12-13 DIAGNOSIS — G4733 Obstructive sleep apnea (adult) (pediatric): Secondary | ICD-10-CM

## 2017-12-13 DIAGNOSIS — R51 Headache: Secondary | ICD-10-CM

## 2017-12-13 DIAGNOSIS — R519 Headache, unspecified: Secondary | ICD-10-CM

## 2017-12-13 DIAGNOSIS — R42 Dizziness and giddiness: Secondary | ICD-10-CM

## 2017-12-13 DIAGNOSIS — K219 Gastro-esophageal reflux disease without esophagitis: Secondary | ICD-10-CM | POA: Diagnosis not present

## 2017-12-13 DIAGNOSIS — G629 Polyneuropathy, unspecified: Secondary | ICD-10-CM | POA: Diagnosis not present

## 2017-12-13 DIAGNOSIS — E1040 Type 1 diabetes mellitus with diabetic neuropathy, unspecified: Secondary | ICD-10-CM | POA: Diagnosis not present

## 2017-12-13 DIAGNOSIS — E7800 Pure hypercholesterolemia, unspecified: Secondary | ICD-10-CM

## 2017-12-13 DIAGNOSIS — E039 Hypothyroidism, unspecified: Secondary | ICD-10-CM

## 2017-12-13 NOTE — Progress Notes (Signed)
Patient ID: Eric Hayden, male   DOB: 1976/05/28, 42 y.o.   MRN: [id number]   Subjective:    Patient ID: Eric Hayden, male    DOB: 1975-09-30, 42 y.o.   MRN: [id number]  HPI  Patient here for a scheduled follow up.  Has been seeing neurology for evaluation of dizziness, previous syncopal episode and headache.  Notes reviewed.  Had MRI and prolonged EEG.  Unrevealing.  Diagnosed with presumed vestibular migraine.  On magnesium.  Neurology recommended starting topamax.  He has concerns about starting this medication.  Concerned regarding interactions with his other medication.  Also had relatives who did not do well with topamax.  States otherwise doing ok.  No chest pain with increased activity or exertion.  No sob.  No acid reflux.  No abdominal pain.  Bowels moving.  Increased stress.  Discussed with him.  He does not feel needs any further intervention.  Has good support.  Headaches are becoming more frequent and more of an issue for him.     Past Medical History:  Diagnosis Date  . Asthma   . Cancer ([place])   . Chicken pox   . GERD (gastroesophageal reflux disease)   . Heart murmur   . Hx of oral aphthous ulcers   . Hypothyroidism   . Migraines   . Type 1 diabetes mellitus ([place])    Past Surgical History:  Procedure Laterality Date  . APPENDECTOMY  2012  . TONSILLECTOMY AND ADENOIDECTOMY  2007   Family History  Problem Relation Age of Onset  . Arthritis Maternal Grandmother   . Hyperlipidemia Maternal Grandmother   . Heart disease Maternal Grandmother   . Hypertension Maternal Grandmother   . Diabetes Maternal Grandmother        type 2  . Heart attack Maternal Grandmother   . Arthritis Maternal Grandfather   . Heart disease Maternal Grandfather   . Hypertension Maternal Grandfather   . Heart attack Maternal Grandfather   . Arthritis Mother   . Hyperlipidemia Mother   . Hypertension Mother   . Diabetes Mother        type 2  . Hyperlipidemia Father   . Arthritis Paternal  Grandmother   . Arthritis Paternal Grandfather   . Heart attack Paternal Aunt    Social History   Socioeconomic History  . Marital status: Married    Spouse name: Not on file  . Number of children: 2  . Years of education: Not on file  . Highest education level: Not on file  Occupational History  . Not on file  Social Needs  . Financial resource strain: Not on file  . Food insecurity:    Worry: Not on file    Inability: Not on file  . Transportation needs:    Medical: Not on file    Non-medical: Not on file  Tobacco Use  . Smoking status: Never Smoker  . Smokeless tobacco: Never Used  Substance and Sexual Activity  . Alcohol use: No    Alcohol/week: 0.0 oz  . Drug use: No  . Sexual activity: Not on file  Lifestyle  . Physical activity:    Days per week: Not on file    Minutes per session: Not on file  . Stress: Not on file  Relationships  . Social connections:    Talks on phone: Not on file    Gets together: Not on file    Attends religious service: Not on file    Active member  of club or organization: Not on file    Attends meetings of clubs or organizations: Not on file    Relationship status: Not on file  Other Topics Concern  . Not on file  Social History Narrative  . Not on file    Outpatient Encounter Medications as of 12/13/2017  Medication Sig  . amitriptyline (ELAVIL) 25 MG tablet TAKE ONE TABLET BY MOUTH EVERY DAY  . atorvastatin (LIPITOR) 10 MG tablet TAKE ONE TABLET BY MOUTH EVERY DAY  . carbamide peroxide (DEBROX) 6.5 % otic solution 3-4 drops in both ears q day.  Massage approximately 5 minutes  . CONTOUR NEXT TEST test strip   . gabapentin (NEURONTIN) 300 MG capsule Start 332m tablet at bedtime. Titrate as needed to max 2 tabs twice daily.  . insulin lispro (HUMALOG) 100 UNIT/ML injection As directed.  Dispense 4 vials.  .Marland Kitchenlevothyroxine (SYNTHROID, LEVOTHROID) 137 MCG tablet   . NOVOLOG 100 UNIT/ML injection   . omeprazole (PRILOSEC) 20 MG  capsule TAKE 1 CAPSULE BY MOUTH EVERY MORNING  . ranitidine (ZANTAC) 150 MG tablet Take 150 mg by mouth at bedtime.  . topiramate (TOPAMAX) 50 MG tablet   . [DISCONTINUED] levothyroxine (SYNTHROID, LEVOTHROID) 112 MCG tablet Take 112 mcg by mouth daily before breakfast.   No facility-administered encounter medications on file as of 12/13/2017.     Review of Systems  Constitutional: Negative for appetite change and unexpected weight change.  HENT: Negative for congestion and sinus pressure.   Respiratory: Negative for cough, chest tightness and shortness of breath.   Cardiovascular: Negative for chest pain, palpitations and leg swelling.  Gastrointestinal: Negative for abdominal pain, diarrhea, nausea and vomiting.  Genitourinary: Negative for difficulty urinating and dysuria.  Musculoskeletal: Negative for joint swelling and myalgias.  Skin: Negative for color change and rash.  Neurological: Positive for dizziness and headaches.  Psychiatric/Behavioral: Negative for agitation and dysphoric mood.       Objective:    Physical Exam  Constitutional: He appears well-developed and well-nourished. No distress.  HENT:  Nose: Nose normal.  Mouth/Throat: Oropharynx is clear and moist.  Neck: Neck supple.  Cardiovascular: Normal rate and regular rhythm.  Pulmonary/Chest: Effort normal and breath sounds normal. No respiratory distress.  Abdominal: Soft. Bowel sounds are normal. There is no tenderness.  Musculoskeletal: He exhibits no edema.  Lymphadenopathy:    He has no cervical adenopathy.  Skin: No rash noted. No erythema.  Psychiatric: He has a normal mood and affect. His behavior is normal.    BP 122/78 (BP Location: Left Arm, Patient Position: Sitting, Cuff Size: Large)   Pulse 80   Temp 98.2 F (36.8 C) (Oral)   Resp 16   Wt 198 lb 12.8 oz (90.2 kg)   SpO2 97%   BMI 31.14 kg/m  Wt Readings from Last 3 Encounters:  12/13/17 198 lb 12.8 oz (90.2 kg)  07/13/17 197 lb 12 oz  (89.7 kg)  07/13/17 197 lb 6.4 oz (89.5 kg)     Lab Results  Component Value Date   WBC 8.6 05/17/2017   HGB 14.3 05/17/2017   HCT 42.2 05/17/2017   PLT 265.0 05/17/2017   GLUCOSE 168 (H) 05/17/2017   CHOL 123 05/17/2017   TRIG 98.0 05/17/2017   HDL 31.50 (L) 05/17/2017   LDLCALC 72 05/17/2017   ALT 16 05/17/2017   AST 14 05/17/2017   NA 140 05/17/2017   K 4.6 05/17/2017   CL 102 05/17/2017   CREATININE 1.06 05/17/2017  BUN 12 05/17/2017   CO2 27 05/17/2017   TSH 1.40 05/17/2017   HGBA1C 6.9 (H) 05/17/2017   MICROALBUR 0.8 05/17/2017    Mr Brain W UG Contrast  Result Date: 06/03/2017 CLINICAL DATA:  Two episodes of syncope within the last month. EXAM: MRI HEAD WITHOUT AND WITH CONTRAST TECHNIQUE: Multiplanar, multiecho pulse sequences of the brain and surrounding structures were obtained without and with intravenous contrast. CONTRAST:  19m MULTIHANCE GADOBENATE DIMEGLUMINE 529 MG/ML IV SOLN COMPARISON:  Head CT 05/18/2014. FINDINGS: Brain: The brain has normal appearance without evidence of malformation, atrophy, old or acute small or large vessel infarction, hemorrhage, hydrocephalus or extra-axial collection. No pituitary abnormality. After contrast administration, no abnormal enhancement occurs. Vascular: Major vessels at the base of the brain show flow. Skull and upper cervical spine: Normal Sinuses/Orbits: Clear/ normal. Other: None significant. IMPRESSION: Normal examination.  No abnormality seen to explain syncope. Electronically Signed   By: MNelson ChimesM.D.   On: 06/03/2017 09:19       Assessment & Plan:   Problem List Items Addressed This Visit    Dizziness    Persistent intermittent issues with headache and dizziness.  Seeing neurology.  MRI and EEG unrevealing.  Contemplating starting topamax.  D/w neurology.        GERD (gastroesophageal reflux disease)    Controlled on current regimen.  Follow.        Headache    Headaches as outlined.  Seeing  neurology.  MRI and EEG unrevealing.  Discussed topamax.  Will d/w neurology.        Relevant Medications   topiramate (TOPAMAX) 50 MG tablet   Hypercholesterolemia    On lipitor.  Low cholesterol diet and exercise.  Follow lipid panel and liver function tests.        Relevant Orders   Hepatic function panel   Lipid panel   Hypothyroidism    On thyroid replacement.  Follow tsh.        Relevant Medications   levothyroxine (SYNTHROID, LEVOTHROID) 137 MCG tablet   Neuropathy    On gabapentin.  Stable.       Obstructive sleep apnea    Using CPAP.       [place] 1 [place]    Followed by endocrinology.  Insulin pump.        Relevant Medications   NOVOLOG 100 UNIT/ML injection   Other Relevant Orders   Hemoglobin AA4G  Basic metabolic panel       SEinar Pheasant MD

## 2017-12-21 ENCOUNTER — Other Ambulatory Visit: Payer: BLUE CROSS/BLUE SHIELD

## 2017-12-21 ENCOUNTER — Other Ambulatory Visit (INDEPENDENT_AMBULATORY_CARE_PROVIDER_SITE_OTHER): Payer: BLUE CROSS/BLUE SHIELD

## 2017-12-21 ENCOUNTER — Encounter: Payer: Self-pay | Admitting: Internal Medicine

## 2017-12-21 DIAGNOSIS — E7800 Pure hypercholesterolemia, unspecified: Secondary | ICD-10-CM

## 2017-12-21 DIAGNOSIS — E1040 Type 1 diabetes mellitus with diabetic neuropathy, unspecified: Secondary | ICD-10-CM

## 2017-12-21 LAB — LIPID PANEL
CHOL/HDL RATIO: 4
Cholesterol: 135 mg/dL (ref 0–200)
HDL: 31.9 mg/dL — AB (ref >39.00)
LDL Cholesterol: 72 mg/dL (ref 0–99)
NONHDL: 103.53
TRIGLYCERIDES: 156 mg/dL — AB (ref 0.0–149.0)
VLDL: 31.2 mg/dL (ref 0.0–40.0)

## 2017-12-21 LAB — BASIC METABOLIC PANEL
BUN: 20 mg/dL (ref 6–23)
CO2: 26 meq/L (ref 19–32)
Calcium: 9.4 mg/dL (ref 8.4–10.5)
Chloride: 105 mEq/L (ref 96–112)
Creatinine, Ser: 1.08 mg/dL (ref 0.40–1.50)
GFR: 80 mL/min (ref >60.00)
GLUCOSE: 162 mg/dL — AB (ref 70–99)
Potassium: 4.3 mEq/L (ref 3.5–5.1)
SODIUM: 137 meq/L (ref 135–145)

## 2017-12-21 LAB — HEMOGLOBIN A1C: Hgb A1c MFr Bld: 6.9 % — ABNORMAL HIGH (ref 4.6–6.5)

## 2017-12-21 LAB — HEPATIC FUNCTION PANEL
ALBUMIN: 4.1 g/dL (ref 3.5–5.2)
ALT: 18 U/L (ref 0–53)
AST: 14 U/L (ref 0–37)
Alkaline Phosphatase: 101 U/L (ref 39–117)
Bilirubin, Direct: 0.1 mg/dL (ref 0.0–0.3)
TOTAL PROTEIN: 7.4 g/dL (ref 6.0–8.3)
Total Bilirubin: 0.4 mg/dL (ref 0.2–1.2)

## 2017-12-21 NOTE — Assessment & Plan Note (Signed)
Using CPAP 

## 2017-12-21 NOTE — Assessment & Plan Note (Signed)
Persistent intermittent issues with headache and dizziness.  Seeing neurology.  MRI and EEG unrevealing.  Contemplating starting topamax.  D/w neurology.

## 2017-12-21 NOTE — Assessment & Plan Note (Signed)
Headaches as outlined.  Seeing neurology.  MRI and EEG unrevealing.  Discussed topamax.  Will d/w neurology.

## 2017-12-21 NOTE — Assessment & Plan Note (Signed)
On thyroid replacement.  Follow tsh.  

## 2017-12-21 NOTE — Assessment & Plan Note (Signed)
On lipitor.  Low cholesterol diet and exercise.  Follow lipid panel and liver function tests.   

## 2017-12-21 NOTE — Assessment & Plan Note (Signed)
Controlled on current regimen.  Follow.  

## 2017-12-21 NOTE — Assessment & Plan Note (Signed)
Followed by endocrinology.  Insulin pump.

## 2017-12-21 NOTE — Assessment & Plan Note (Signed)
On gabapentin.  Stable.

## 2017-12-22 ENCOUNTER — Encounter: Payer: Self-pay | Admitting: Internal Medicine

## 2018-06-19 ENCOUNTER — Ambulatory Visit: Payer: Self-pay

## 2018-06-19 ENCOUNTER — Encounter: Payer: BLUE CROSS/BLUE SHIELD | Admitting: Internal Medicine

## 2018-06-19 NOTE — Telephone Encounter (Signed)
LMTCB

## 2018-06-19 NOTE — Telephone Encounter (Signed)
Please call pt and notify him that I agree with contacting Dr Manuella Ghazi to see what he suggest for aborting the headache when it starts.  Also making him aware of the headache numbers he has had over the past few weeks.  (Dr Manuella Ghazi is following him for his headaches and adjusting medication).  Would recommend calling back, if he does not hear.  Let us know if any problems.

## 2018-06-19 NOTE — Telephone Encounter (Signed)
Left message for neurology on Friday.   Patient states he switched over to Trokendi 100 mg , no longer on Topamax.   Last 3 weeks he experienced 3 vestibular headaches  now .  Now patient is wondering if he can prescribed something for symptoms of headaches , when he wakes up with headaches to get rid of headache.  He is only on preventive headache medicine.  Please advise.

## 2018-06-19 NOTE — Telephone Encounter (Signed)
Pt.'s reports pt is seen by neurology for vestibular migraines - Dr. Manuella Ghazi. None of the medicine "is working." So dizzy this morning, he cannot get out of bed and cancelled his Physical appointment today. Has a call into neurology as well. Would like to know if Dr. Nicki Reaper can prescribe anything else for him or suggest any treatments.Please advise.

## 2018-06-19 NOTE — Telephone Encounter (Signed)
Patient in agreement stating he will call the office back of Dr. Manuella Ghazi.

## 2018-06-19 NOTE — Telephone Encounter (Signed)
Please advise 

## 2018-07-17 ENCOUNTER — Other Ambulatory Visit: Payer: Self-pay | Admitting: Internal Medicine

## 2018-07-24 ENCOUNTER — Other Ambulatory Visit: Payer: Self-pay | Admitting: Internal Medicine

## 2019-05-14 ENCOUNTER — Other Ambulatory Visit: Payer: Self-pay

## 2019-05-14 DIAGNOSIS — Z20822 Contact with and (suspected) exposure to covid-19: Secondary | ICD-10-CM

## 2019-05-15 LAB — NOVEL CORONAVIRUS, NAA: SARS-CoV-2, NAA: NOT DETECTED

## 2019-05-29 LAB — HEMOGLOBIN A1C: Hemoglobin A1C: 6.8

## 2019-07-19 LAB — HM DIABETES EYE EXAM

## 2019-07-24 ENCOUNTER — Ambulatory Visit: Payer: BLUE CROSS/BLUE SHIELD | Attending: Internal Medicine

## 2019-07-24 DIAGNOSIS — Z20822 Contact with and (suspected) exposure to covid-19: Secondary | ICD-10-CM

## 2019-07-26 LAB — NOVEL CORONAVIRUS, NAA: SARS-CoV-2, NAA: NOT DETECTED

## 2019-08-01 ENCOUNTER — Ambulatory Visit: Payer: BLUE CROSS/BLUE SHIELD | Attending: Internal Medicine

## 2019-08-01 DIAGNOSIS — Z20822 Contact with and (suspected) exposure to covid-19: Secondary | ICD-10-CM

## 2019-08-03 LAB — NOVEL CORONAVIRUS, NAA: SARS-CoV-2, NAA: NOT DETECTED

## 2019-08-13 IMAGING — MR MR HEAD WO/W CM
12 series · 48 of 48 positions shown · IV contrast (multihance)
Comparison: Head CT 05/18/2014.

CLINICAL DATA: Two episodes of syncope within the last month.

EXAM:
MRI HEAD WITHOUT AND WITH CONTRAST
TECHNIQUE: Multiplanar, multiecho pulse sequences of the brain and surrounding
structures were obtained without and with intravenous contrast.
CONTRAST:  18mL MULTIHANCE GADOBENATE DIMEGLUMINE 529 MG/ML IV SOLN

[Series 2: T1 · sagittal · 5.0mm · 0.45mm/px · 1 of 25 slices shown (1 of 2)]
[im 1/25]
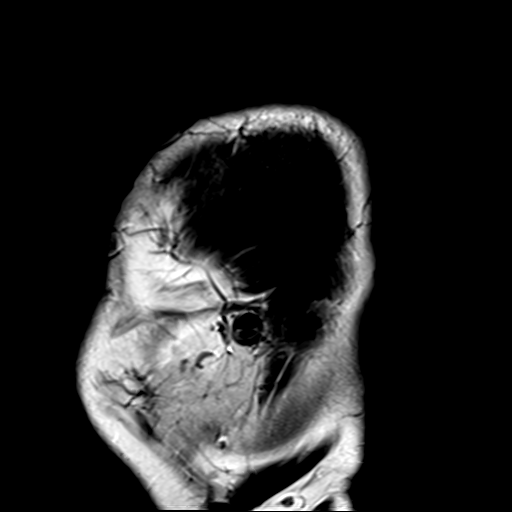

[Series 4: DWI · axial · 3.0mm · 1.80mm/px · z∈[-28,+133]mm · 4 of 55 slices shown (1 of 2)]
[im 1/55]
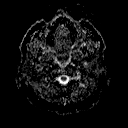
[im 19/55]
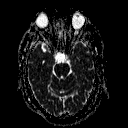
[im 37/55]
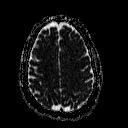
[im 55/55]
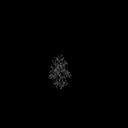

[Series 6: DWI · coronal · 3.0mm · 1.80mm/px · 3 of 45 slices shown (2 of 2)]
[im 1/45]
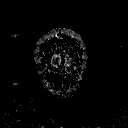
[im 23/45]
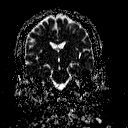
[im 45/45]
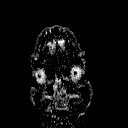

[Series 7: T2 · axial · 5.0mm · 0.60mm/px · z∈[-25,+130]mm · 2 of 25 slices shown (1 of 2)]
[im 1/25]
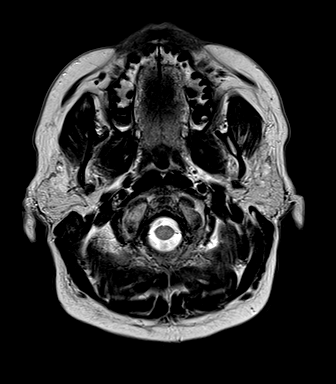
[im 25/25]
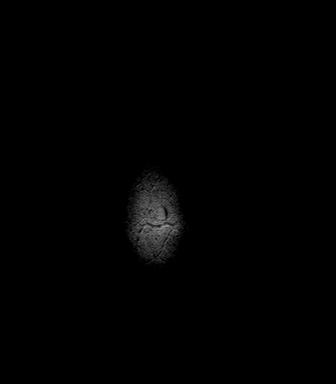

[Series 8: FLAIR · axial · 3.0mm · 0.45mm/px · z∈[-25,+130]mm · 3 of 53 slices shown]
[im 1/53]
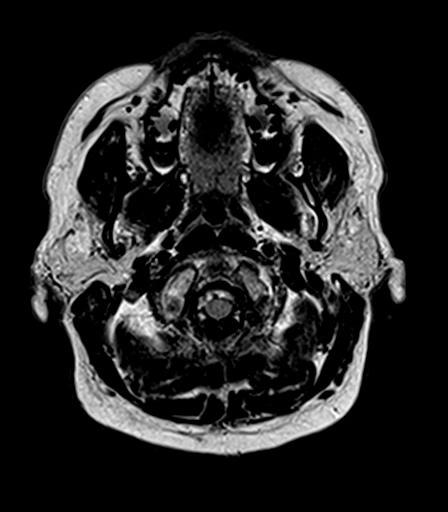
[im 27/53]
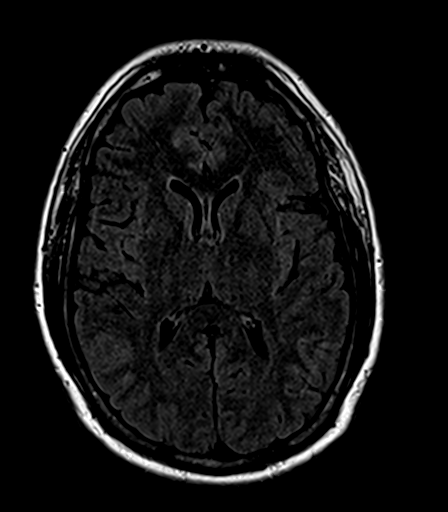
[im 53/53]
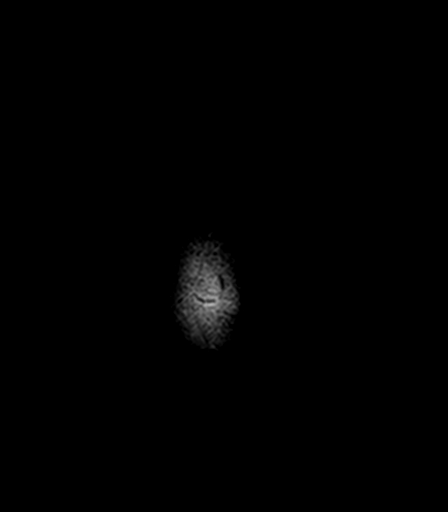

[Series 9: T2 · axial · 5.0mm · 0.45mm/px · z∈[-25,+130]mm · 2 of 25 slices shown (2 of 2)]
[im 1/25]
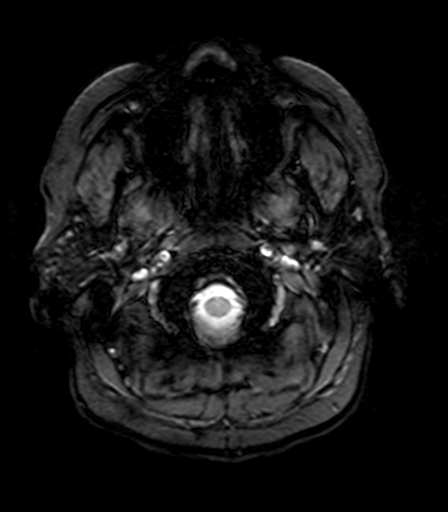
[im 25/25]
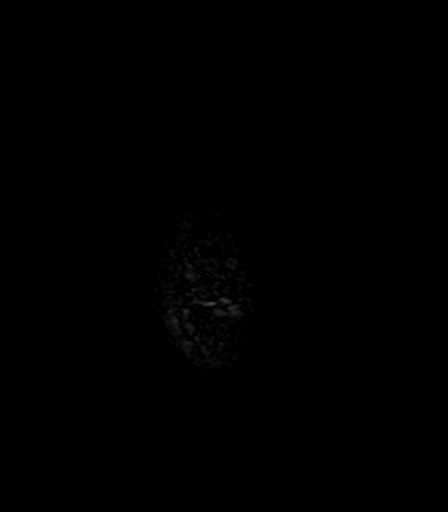

[Series 10: T1 · axial · 1.0mm · 1.00mm/px · z∈[-33,+141]mm · 11 of 176 slices shown (2 of 2)]
[im 1/176]
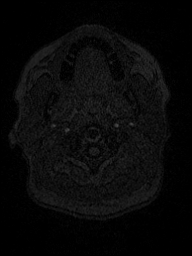
[im 18/176]
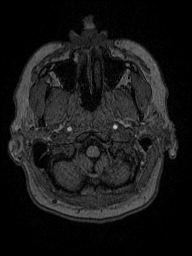
[im 36/176]
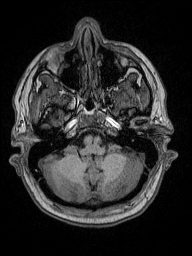
[im 53/176]
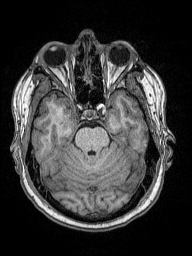
[im 71/176]
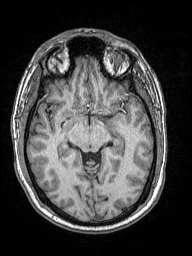
[im 88/176]
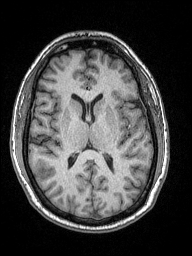
[im 106/176]
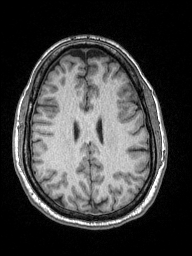
[im 123/176]
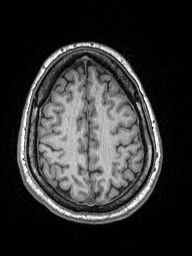
[im 141/176]
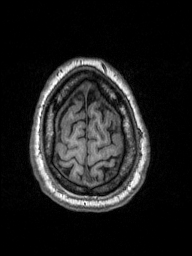
[im 158/176]
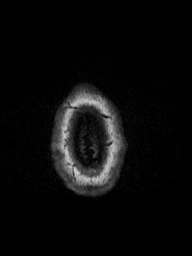
[im 176/176]
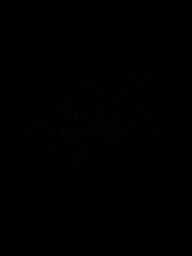

[Series 11: T2 post-contrast · coronal · 5.0mm · 0.49mm/px · 2 of 29 slices shown]
[im 1/29]
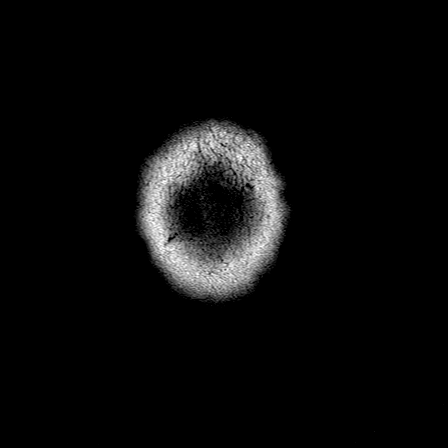
[im 29/29]
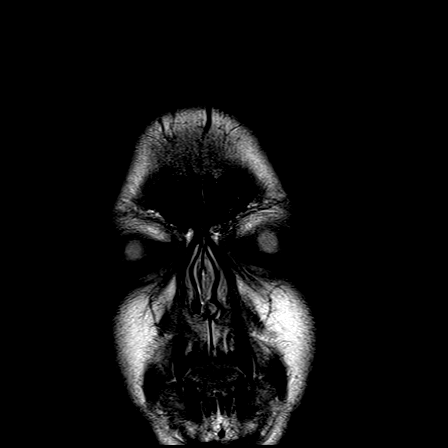

[Series 12: T1 post-contrast · axial · 1.0mm · 1.00mm/px · z∈[-33,+141]mm · 11 of 176 slices shown (1 of 2)]
[im 1/176]
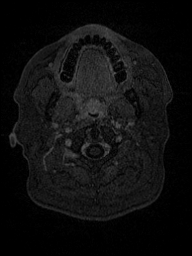
[im 18/176]
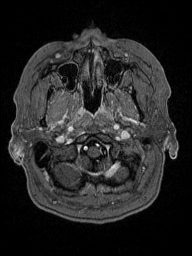
[im 36/176]
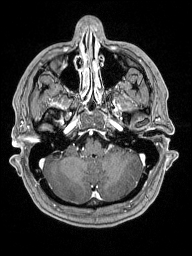
[im 53/176]
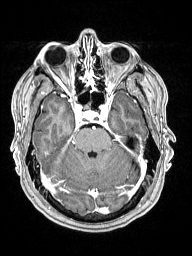
[im 71/176]
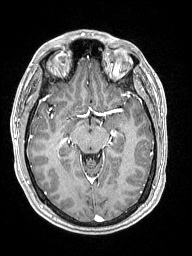
[im 88/176]
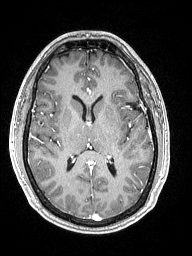
[im 106/176]
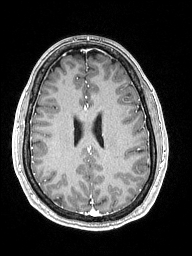
[im 123/176]
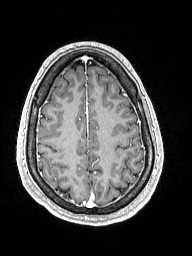
[im 141/176]
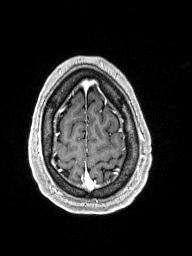
[im 158/176]
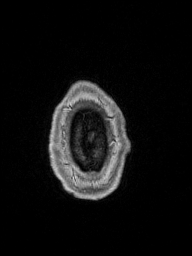
[im 176/176]
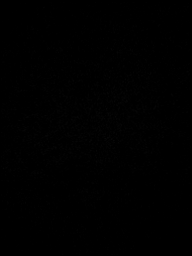

[Series 13: T1 post-contrast · coronal · 5.0mm · 0.43mm/px · 2 of 29 slices shown (2 of 2)]
[im 1/29]
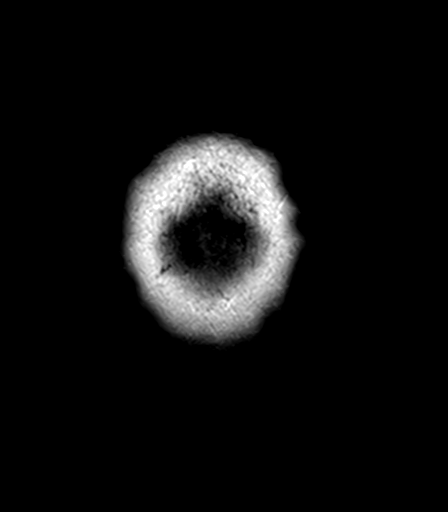
[im 29/29]
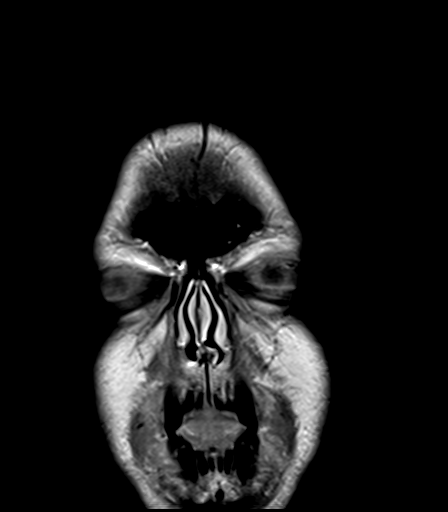

[Series 100: ax (id) · axial · 3.0mm · 1.80mm/px · z∈[-28,+133]mm · 4 of 55 slices shown]
[im 1/55]
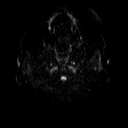
[im 19/55]
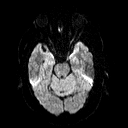
[im 37/55]
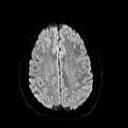
[im 55/55]
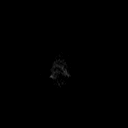

[Series 101: cor (id) · coronal · 3.0mm · 1.80mm/px · 3 of 45 slices shown]
[im 1/45]
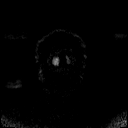
[im 23/45]
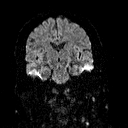
[im 45/45]
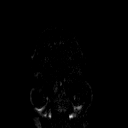

[48 of 48 positions shown; findings below may reference images not displayed]

FINDINGS: Brain: The brain has normal appearance without evidence of
malformation, atrophy, old or acute small or large vessel
infarction, hemorrhage, hydrocephalus or extra-axial collection. No
pituitary abnormality. After contrast administration, no abnormal
enhancement occurs.

Vascular: Major vessels at the base of the brain show flow.

Skull and upper cervical spine: Normal

Sinuses/Orbits: Clear/ normal.

Other: None significant.
IMPRESSION: Normal examination.  No abnormality seen to explain syncope.

## 2019-08-31 ENCOUNTER — Other Ambulatory Visit: Payer: Self-pay

## 2019-08-31 ENCOUNTER — Encounter: Payer: Self-pay | Admitting: Internal Medicine

## 2019-08-31 ENCOUNTER — Ambulatory Visit (INDEPENDENT_AMBULATORY_CARE_PROVIDER_SITE_OTHER): Payer: 59 | Admitting: Internal Medicine

## 2019-08-31 DIAGNOSIS — K219 Gastro-esophageal reflux disease without esophagitis: Secondary | ICD-10-CM | POA: Diagnosis not present

## 2019-08-31 DIAGNOSIS — R519 Headache, unspecified: Secondary | ICD-10-CM

## 2019-08-31 DIAGNOSIS — E039 Hypothyroidism, unspecified: Secondary | ICD-10-CM

## 2019-08-31 DIAGNOSIS — R1084 Generalized abdominal pain: Secondary | ICD-10-CM | POA: Diagnosis not present

## 2019-08-31 DIAGNOSIS — E1040 Type 1 diabetes mellitus with diabetic neuropathy, unspecified: Secondary | ICD-10-CM

## 2019-08-31 DIAGNOSIS — R140 Abdominal distension (gaseous): Secondary | ICD-10-CM

## 2019-08-31 DIAGNOSIS — E7800 Pure hypercholesterolemia, unspecified: Secondary | ICD-10-CM

## 2019-08-31 DIAGNOSIS — G629 Polyneuropathy, unspecified: Secondary | ICD-10-CM

## 2019-08-31 NOTE — Progress Notes (Signed)
Patient ID: Eric Hayden, male   DOB: Jul 29, 1976, 44 y.o.   MRN: [id number]   Virtual Visit via video Note  This visit type was conducted due to national recommendations for restrictions regarding the COVID-19 pandemic (e.g. social distancing).  This format is felt to be most appropriate for this patient at this time.  All issues noted in this document were discussed and addressed.  No physical exam was performed (except for noted visual exam findings with Video Visits).   I connected with [name] by a video enabled telemedicine application and verified that I am speaking with the correct person using two identifiers. Location patient: home Location provider: work  Persons participating in the virtual visit: patient, provider  The limitations, risks, security and privacy concerns of performing an evaluation and management service by video and the availability of in person appointments have been discussed. The patient expressed understanding and agreed to proceed.   Reason for visit: follow up appt  HPI: Due to insurance changes, I have not see him in approximately one year.  He reports he has been doing relatively well.  Has been seeing endocrinology for f/u of his diabetes.  Also on amitriptyline and gabapentin for neuropathy.  Has a history of migraines. Was taking trakendi.  Still with headaches - at least one per week.  Has been off now for a while.  States now having 2-4 headaches per week.  Taking excedrin migraine.  Multiple in a week.  Discussed the possibility of rebound headaches.  Headaches feel like his typical migraine headaches.  No cahnge.  No sinus congestion.  No chest pain or sob reported.  No increased cough or congestion.  Does have persistent intermittent flares with his stomach.  Mostly occurs in the evening.  Will feel bloated.  No nausea or vomiting.  Some discomfort in his upper stomach.  Some increased gas - pain with this. Some diarrhea.  No known triggers.  No fever.   Eating.  Takes a probiotic.  Has taken some imodium.     ROS: See pertinent positives and negatives per HPI.  Past Medical History:  Diagnosis Date  . Asthma   . Cancer ([place])   . Chicken pox   . GERD (gastroesophageal reflux disease)   . Heart murmur   . Hx of oral aphthous ulcers   . Hypothyroidism   . Migraines   . Type 1 diabetes mellitus ([place])     Past Surgical History:  Procedure Laterality Date  . APPENDECTOMY  2012  . TONSILLECTOMY AND ADENOIDECTOMY  2007    Family History  Problem Relation Age of Onset  . Arthritis Maternal Grandmother   . Hyperlipidemia Maternal Grandmother   . Heart disease Maternal Grandmother   . Hypertension Maternal Grandmother   . Diabetes Maternal Grandmother        type 2  . Heart attack Maternal Grandmother   . Arthritis Maternal Grandfather   . Heart disease Maternal Grandfather   . Hypertension Maternal Grandfather   . Heart attack Maternal Grandfather   . Arthritis Mother   . Hyperlipidemia Mother   . Hypertension Mother   . Diabetes Mother        type 2  . Hyperlipidemia Father   . Arthritis Paternal Grandmother   . Arthritis Paternal Grandfather   . Heart attack Paternal Aunt     SOCIAL HX: reviewed.    Current Outpatient Medications:  .  amitriptyline (ELAVIL) 25 MG tablet, TAKE ONE TABLET AT BEDTIME, Disp:  90 tablet, Rfl: 1 .  atorvastatin (LIPITOR) 10 MG tablet, TAKE ONE TABLET BY MOUTH EVERY DAY, Disp: 30 tablet, Rfl: 6 .  carbamide peroxide (DEBROX) 6.5 % otic solution, 3-4 drops in both ears q day.  Massage approximately 5 minutes, Disp: 15 mL, Rfl: 0 .  CONTOUR NEXT TEST test strip, , Disp: , Rfl: 1 .  gabapentin (NEURONTIN) 300 MG capsule, Start 360m tablet at bedtime. Titrate as needed to max 2 tabs twice daily., Disp: , Rfl:  .  insulin lispro (HUMALOG) 100 UNIT/ML injection, As directed.  Dispense 4 vials., Disp: 10 mL, Rfl: 5 .  levothyroxine (SYNTHROID, LEVOTHROID) 137 MCG tablet, , Disp: , Rfl: 1 .   NOVOLOG 100 UNIT/ML injection, , Disp: , Rfl: 11 .  omeprazole (PRILOSEC) 20 MG capsule, TAKE 1 CAPSULE BY MOUTH EVERY DAY, Disp: 90 capsule, Rfl: 0 .  ranitidine (ZANTAC) 150 MG tablet, Take 150 mg by mouth at bedtime., Disp: , Rfl:  .  topiramate (TOPAMAX) 50 MG tablet, , Disp: , Rfl: 7  EXAM:  GENERAL: alert, oriented, appears well and in no acute distress  HEENT: atraumatic, conjunttiva clear, no obvious abnormalities on inspection of external nose and ears  NECK: normal movements of the head and neck  LUNGS: on inspection no signs of respiratory distress, breathing rate appears normal, no obvious gross SOB, gasping or wheezing  CV: no obvious cyanosis  PSYCH/NEURO: pleasant and cooperative, no obvious depression or anxiety, speech and thought processing grossly intact  ASSESSMENT AND PLAN:  Discussed the following assessment and plan:  Abdominal pain Increased abdominal bloating and discomfort as outlined.  No known triggers.  No nausea or vomiting.  Bowels are moving.  Check labs including liver panel and pancreas tests.  Also obtain CT abdomen and pelvis.  No acid reflux.    GERD (gastroesophageal reflux disease) On prilosec.  Follow.    Headache Persistent issues with migraine headaches.  Not taking medication now.  Has 2-4/week.  Was on trakindi. Discussed with neurology.  Will see if can get PA for ajovy.  Also arrange f/u with neurology.  See if can decrease excedrin migraine use.  Follow.   Hypercholesterolemia On lipitor. Low cholesterol diet and exercise.  Follow lipid panel and liver function tests.    Hypothyroidism On thyroid replacement.  Follow tsh.    Neuropathy On gabapentin and amitriptyline.  Stable.    Type 1 diabetes mellitus Continue monitoring carb intake. Follow sugars.  Sees endocrinology.     Orders Placed This Encounter  Procedures  . CT Abdomen Pelvis W Contrast    Standing Status:   Future    Standing Expiration Date:   11/30/2020     Order Specific Question:   If indicated for the ordered procedure, I authorize the administration of contrast media per Radiology protocol    Answer:   Yes    Order Specific Question:   Preferred imaging location?    Answer:   [facility]    Order Specific Question:   Is Oral Contrast requested for this exam?    Answer:   Yes, Per Radiology protocol    Order Specific Question:   Radiology Contrast Protocol - do NOT remove file path    Answer:   [email or web address]  . CBC with Differential/Platelet    Standing Status:   Future    Standing Expiration Date:   09/02/2020  . Hepatic function panel    Standing Status:   Future    Standing Expiration Date:  09/02/2020  . Lipid panel    Standing Status:   Future    Standing Expiration Date:   09/02/2020  . TSH    Standing Status:   Future    Standing Expiration Date:   09/02/2020  . Basic metabolic panel    Standing Status:   Future    Standing Expiration Date:   09/02/2020  . Amylase    Standing Status:   Future    Standing Expiration Date:   09/02/2020  . Lipase    Standing Status:   Future    Standing Expiration Date:   09/02/2020    No orders of the defined types were placed in this encounter.    I discussed the assessment and treatment plan with the patient. The patient was provided an opportunity to ask questions and all were answered. The patient agreed with the plan and demonstrated an understanding of the instructions.   The patient was advised to call back or seek an in-person evaluation if the symptoms worsen or if the condition fails to improve as anticipated.   Einar Pheasant, MD

## 2019-09-02 ENCOUNTER — Encounter: Payer: Self-pay | Admitting: Internal Medicine

## 2019-09-03 ENCOUNTER — Encounter: Payer: Self-pay | Admitting: Internal Medicine

## 2019-09-03 MED ORDER — AJOVY 225 MG/1.5ML ~~LOC~~ SOAJ: 225.0000 mg | SUBCUTANEOUS | 6 refills | Status: DC

## 2019-09-03 NOTE — Telephone Encounter (Signed)
rx sent in for ajovy

## 2019-09-03 NOTE — Assessment & Plan Note (Signed)
On gabapentin and amitriptyline.  Stable.

## 2019-09-03 NOTE — Assessment & Plan Note (Signed)
On prilosec.  Follow.

## 2019-09-03 NOTE — Assessment & Plan Note (Signed)
On lipitor.  Low cholesterol diet and exercise.  Follow lipid panel and liver function tests.   

## 2019-09-03 NOTE — Assessment & Plan Note (Signed)
Continue monitoring carb intake. Follow sugars.  Sees endocrinology.

## 2019-09-03 NOTE — Assessment & Plan Note (Signed)
Persistent issues with migraine headaches.  Not taking medication now.  Has 2-4/week.  Was on trakindi. Discussed with neurology.  Will see if can get PA for ajovy.  Also arrange f/u with neurology.  See if can decrease excedrin migraine use.  Follow.

## 2019-09-03 NOTE — Assessment & Plan Note (Signed)
On thyroid replacement.  Follow tsh.  

## 2019-09-03 NOTE — Assessment & Plan Note (Signed)
Increased abdominal bloating and discomfort as outlined.  No known triggers.  No nausea or vomiting.  Bowels are moving.  Check labs including liver panel and pancreas tests.  Also obtain CT abdomen and pelvis.  No acid reflux.

## 2019-09-05 ENCOUNTER — Other Ambulatory Visit (INDEPENDENT_AMBULATORY_CARE_PROVIDER_SITE_OTHER): Payer: 59

## 2019-09-05 ENCOUNTER — Other Ambulatory Visit: Payer: Self-pay

## 2019-09-05 DIAGNOSIS — E1040 Type 1 diabetes mellitus with diabetic neuropathy, unspecified: Secondary | ICD-10-CM

## 2019-09-05 DIAGNOSIS — R140 Abdominal distension (gaseous): Secondary | ICD-10-CM

## 2019-09-05 DIAGNOSIS — E7800 Pure hypercholesterolemia, unspecified: Secondary | ICD-10-CM

## 2019-09-05 DIAGNOSIS — R1084 Generalized abdominal pain: Secondary | ICD-10-CM

## 2019-09-05 DIAGNOSIS — E039 Hypothyroidism, unspecified: Secondary | ICD-10-CM | POA: Diagnosis not present

## 2019-09-05 LAB — LIPASE: Lipase: 13 U/L (ref 11.0–59.0)

## 2019-09-05 LAB — BASIC METABOLIC PANEL
BUN: 12 mg/dL (ref 6–23)
CO2: 32 mEq/L (ref 19–32)
Calcium: 9.7 mg/dL (ref 8.4–10.5)
Chloride: 102 mEq/L (ref 96–112)
Creatinine, Ser: 1 mg/dL (ref 0.40–1.50)
GFR: 81.59 mL/min (ref >60.00)
Glucose, Bld: 121 mg/dL — ABNORMAL HIGH (ref 70–99)
Potassium: 4.8 mEq/L (ref 3.5–5.1)
Sodium: 138 mEq/L (ref 135–145)

## 2019-09-05 LAB — CBC WITH DIFFERENTIAL/PLATELET
Basophils Absolute: 0.1 10*3/uL (ref 0.0–0.1)
Basophils Relative: 0.8 % (ref 0.0–3.0)
Eosinophils Absolute: 0.5 10*3/uL (ref 0.0–0.7)
Eosinophils Relative: 5 % (ref 0.0–5.0)
HCT: 46.7 % (ref 39.0–52.0)
Hemoglobin: 15.4 g/dL (ref 13.0–17.0)
Lymphocytes Relative: 29.5 % (ref 12.0–46.0)
Lymphs Abs: 2.8 10*3/uL (ref 0.7–4.0)
MCHC: 33 g/dL (ref 30.0–36.0)
MCV: 86.4 fl (ref 78.0–100.0)
Monocytes Absolute: 0.5 10*3/uL (ref 0.1–1.0)
Monocytes Relative: 5.7 % (ref 3.0–12.0)
Neutro Abs: 5.6 10*3/uL (ref 1.4–7.7)
Neutrophils Relative %: 59 % (ref 43.0–77.0)
Platelets: 299 10*3/uL (ref 150.0–400.0)
RBC: 5.41 Mil/uL (ref 4.22–5.81)
RDW: 13 % (ref 11.5–15.5)
WBC: 9.4 10*3/uL (ref 4.0–10.5)

## 2019-09-05 LAB — HEPATIC FUNCTION PANEL
ALT: 29 U/L (ref 0–53)
AST: 22 U/L (ref 0–37)
Albumin: 4.5 g/dL (ref 3.5–5.2)
Alkaline Phosphatase: 124 U/L — ABNORMAL HIGH (ref 39–117)
Bilirubin, Direct: 0.1 mg/dL (ref 0.0–0.3)
Total Bilirubin: 0.5 mg/dL (ref 0.2–1.2)
Total Protein: 7.8 g/dL (ref 6.0–8.3)

## 2019-09-05 LAB — TSH: TSH: 3.5 u[IU]/mL (ref 0.35–4.50)

## 2019-09-05 LAB — LIPID PANEL
Cholesterol: 148 mg/dL (ref 0–200)
HDL: 37.8 mg/dL — ABNORMAL LOW (ref >39.00)
LDL Cholesterol: 89 mg/dL (ref 0–99)
NonHDL: 110.65
Total CHOL/HDL Ratio: 4
Triglycerides: 109 mg/dL (ref 0.0–149.0)
VLDL: 21.8 mg/dL (ref 0.0–40.0)

## 2019-09-05 LAB — AMYLASE: Amylase: 21 U/L — ABNORMAL LOW (ref 27–131)

## 2019-09-06 ENCOUNTER — Encounter: Payer: Self-pay | Admitting: Internal Medicine

## 2019-09-13 NOTE — Telephone Encounter (Signed)
Eric Hayden is going to start ajovy injections for migraine headache.  Is he able to come in here for instruction/direction - on how to give.

## 2019-09-17 NOTE — Telephone Encounter (Signed)
We can schedule Nurse visit for teaching it is a Sub  Q injection given the thigh pen needle , just remove cap press at 90 degree angle into anterior thigh wait for second click disenage injectio complete. Patient is scheduled for Wednesday at 11:30.

## 2019-09-18 ENCOUNTER — Encounter: Payer: Self-pay | Admitting: Internal Medicine

## 2019-09-18 ENCOUNTER — Telehealth: Payer: Self-pay | Admitting: Internal Medicine

## 2019-09-18 ENCOUNTER — Other Ambulatory Visit: Payer: Self-pay

## 2019-09-18 ENCOUNTER — Ambulatory Visit
Admission: RE | Admit: 2019-09-18 | Discharge: 2019-09-18 | Disposition: A | Discharge status: Home / Self Care | Payer: 59 | Source: Ambulatory Visit | Attending: Internal Medicine | Admitting: Internal Medicine

## 2019-09-18 DIAGNOSIS — R1084 Generalized abdominal pain: Secondary | ICD-10-CM | POA: Diagnosis not present

## 2019-09-18 DIAGNOSIS — R140 Abdominal distension (gaseous): Secondary | ICD-10-CM | POA: Insufficient documentation

## 2019-09-18 DIAGNOSIS — R935 Abnormal findings on diagnostic imaging of other abdominal regions, including retroperitoneum: Secondary | ICD-10-CM

## 2019-09-18 DIAGNOSIS — K639 Disease of intestine, unspecified: Secondary | ICD-10-CM

## 2019-09-18 MED ORDER — IOHEXOL 300 MG/ML  SOLN
100.0000 mL | Freq: Once | INTRAMUSCULAR | Status: AC | PRN
  Administered 2019-09-18: 11:00:00 100 mL via INTRAVENOUS

## 2019-09-18 NOTE — Telephone Encounter (Signed)
CT results are in Epic

## 2019-09-18 NOTE — Telephone Encounter (Signed)
Eric Hayden with [facility] radiology called to give CT results. Please cal back @ [phone]

## 2019-09-18 NOTE — Telephone Encounter (Signed)
Pt notified of CT results via my chart. Refer to GI.

## 2019-09-18 NOTE — Telephone Encounter (Signed)
Spoke to [place].  Pt aware of appt.

## 2019-09-19 ENCOUNTER — Ambulatory Visit: Payer: 59 | Admitting: *Deleted

## 2019-09-21 NOTE — Telephone Encounter (Signed)
Order placed for GI referral.   

## 2019-09-24 ENCOUNTER — Ambulatory Visit (INDEPENDENT_AMBULATORY_CARE_PROVIDER_SITE_OTHER): Payer: 59 | Admitting: Gastroenterology

## 2019-09-24 ENCOUNTER — Other Ambulatory Visit: Payer: Self-pay

## 2019-09-24 VITALS — BP 161/91 | HR 81 | Temp 98.4°F | Ht 67.0 in | Wt 187.8 lb

## 2019-09-24 DIAGNOSIS — R933 Abnormal findings on diagnostic imaging of other parts of digestive tract: Secondary | ICD-10-CM

## 2019-09-24 DIAGNOSIS — K580 Irritable bowel syndrome with diarrhea: Secondary | ICD-10-CM

## 2019-09-24 MED ORDER — NA SULFATE-K SULFATE-MG SULF 17.5-3.13-1.6 GM/177ML PO SOLN: 1.0000 | Freq: Once | ORAL | 0 refills | Status: AC

## 2019-09-24 NOTE — Progress Notes (Signed)
Jonathon Bellows MD, MRCP(U.K) [address]  [place]  [place], [place] [address]  Main: [phone]  Fax: [phone]   Gastroenterology Consultation  Referring Provider:     Einar Pheasant, MD Primary Care Physician:  Einar Pheasant, MD Primary Gastroenterologist:  Dr. Jonathon Bellows  Reason for Consultation:     Abnormal CT scan of the abdomen, bloating        HPI:   Eric Hayden is a 44 y.o. y/o male referred for consultation & management  by Dr. Einar Pheasant, MD.    He underwent a CT scan of the abdomen on 09/18/2019 for abdominal distention and diarrhea for the past few months.  Questionable focal wall thickening of the proximal transverse colon noted.  Further evaluation with colonoscopy is recommended.  09/05/2019 hemoglobin 15.4 g, hepatic function was normal except a mildly elevated alkaline phosphatase of 124.  TSH was normal.  Amylase and lipase were also normal  He states that his main complaints are abdominal bloating followed by diarrhea going on for a few months. Initially it was occasionally but now it is more often. He has a lot of rumbling and gurgling sensation and sounds in his abdomen. Very foul-smelling stools when he does have 1 and the stools float on the top of the toilet bowl. Denies any consumption of artificial sugars, Crystal light, Stevia, chewing gum. No rectal bleeding. No prior colonic evaluation. No family history of colon cancer or polyps. No weight loss. Past Medical History:  Diagnosis Date  . Asthma   . Cancer ([place])   . Chicken pox   . GERD (gastroesophageal reflux disease)   . Heart murmur   . Hx of oral aphthous ulcers   . Hypothyroidism   . Migraines   . Type 1 diabetes mellitus ([place])     Past Surgical History:  Procedure Laterality Date  . APPENDECTOMY  2012  . TONSILLECTOMY AND ADENOIDECTOMY  2007    Prior to Admission medications   Medication Sig Start Date End Date Taking? Authorizing Provider  amitriptyline (ELAVIL) 25 MG  tablet TAKE ONE TABLET AT BEDTIME 07/17/18   Einar Pheasant, MD  atorvastatin (LIPITOR) 10 MG tablet TAKE ONE TABLET BY MOUTH EVERY DAY 07/05/14   Einar Pheasant, MD  carbamide peroxide (DEBROX) 6.5 % otic solution 3-4 drops in both ears q day.  Massage approximately 5 minutes 04/12/16   Einar Pheasant, MD  CONTOUR NEXT TEST test strip  12/12/17   [provider]  Fremanezumab-vfrm (AJOVY) 225 MG/1.5ML SOAJ Inject 225 mg into the skin every 30 (thirty) days. 09/03/19   Einar Pheasant, MD  gabapentin (NEURONTIN) 300 MG capsule Start 315m tablet at bedtime. Titrate as needed to max 2 tabs twice daily. 08/20/16   [provider]  insulin lispro (HUMALOG) 100 UNIT/ML injection As directed.  Dispense 4 vials. 08/23/13   SEinar Pheasant MD  levothyroxine (SYNTHROID, LEVOTHROID) 137 MCG tablet  09/22/17   [provider]  NOVOLOG 100 UNIT/ML injection  12/05/17   [provider]  omeprazole (PRILOSEC) 20 MG capsule TAKE 1 CAPSULE BY MOUTH EVERY DAY 07/24/18   SEinar Pheasant MD  ranitidine (ZANTAC) 150 MG tablet Take 150 mg by mouth at bedtime.    [provider]  topiramate (TOPAMAX) 50 MG tablet  12/08/17   [provider]    Family History  Problem Relation Age of Onset  . Arthritis Maternal Grandmother   . Hyperlipidemia Maternal Grandmother   . Heart disease Maternal Grandmother   .  Hypertension Maternal Grandmother   . Diabetes Maternal Grandmother        type 2  . Heart attack Maternal Grandmother   . Arthritis Maternal Grandfather   . Heart disease Maternal Grandfather   . Hypertension Maternal Grandfather   . Heart attack Maternal Grandfather   . Arthritis Mother   . Hyperlipidemia Mother   . Hypertension Mother   . Diabetes Mother        type 2  . Hyperlipidemia Father   . Arthritis Paternal Grandmother   . Arthritis Paternal Grandfather   . Heart attack Paternal Aunt      Social History   Tobacco Use  . Smoking status:  Never Smoker  . Smokeless tobacco: Never Used  Substance Use Topics  . Alcohol use: No    Alcohol/week: 0.0 standard drinks  . Drug use: No    Allergies as of 09/24/2019 - Review Complete 09/18/2019  Allergen Reaction Noted  . Augmentin [amoxicillin-pot clavulanate] Nausea And Vomiting 11/16/2012  . Levaquin [levofloxacin in d5w] Swelling 11/16/2012    Review of Systems:    All systems reviewed and negative except where noted in HPI.   Physical Exam:  There were no vitals taken for this visit. No LMP for male patient. Psych:  Alert and cooperative. Normal mood and affect. General:   Alert,  Well-developed, well-nourished, pleasant and cooperative in NAD Head:  Normocephalic and atraumatic. Eyes:  Sclera clear, no icterus.   Conjunctiva pink. Ears:  Normal auditory acuity. Lungs:  Respirations even and unlabored.  Clear throughout to auscultation.   No wheezes, crackles, or rhonchi. No acute distress. Heart:  Regular rate and rhythm; no murmurs, clicks, rubs, or gallops. Abdomen:  Normal bowel sounds.  No bruits.  Soft, non-tender and non-distended without masses, hepatosplenomegaly or hernias noted.  No guarding or rebound tenderness.    Neurologic:  Alert and oriented x3;  grossly normal neurologically. Psych:  Alert and cooperative. Normal mood and affect.  Imaging Studies: CT Abdomen Pelvis W Contrast  Result Date: 09/18/2019 CLINICAL DATA:  Intermittent bloating, abdominal distention, and diarrhea for the past few months. EXAM: CT ABDOMEN AND PELVIS WITH CONTRAST TECHNIQUE: Multidetector CT imaging of the abdomen and pelvis was performed using the standard protocol following bolus administration of intravenous contrast. CONTRAST:  159m OMNIPAQUE IOHEXOL 300 MG/ML  SOLN COMPARISON:  CT abdomen pelvis dated August 19, 2015. FINDINGS: Lower chest: No acute abnormality. Hepatobiliary: No focal liver abnormality is seen. No gallstones, gallbladder wall thickening, or biliary  dilatation. Pancreas: Fatty infiltration. No ductal dilatation or surrounding inflammatory changes. Spleen: Normal in size without focal abnormality. Adrenals/Urinary Tract: Adrenal glands are unremarkable. Kidneys are normal, without renal calculi, focal lesion, or hydronephrosis. Bladder is unremarkable. Stomach/Bowel: Stomach is within normal limits. Prior appendectomy. Questionable focal wall thickening of the proximal transverse colon (series 4, image 24). No obstruction. Vascular/Lymphatic: No significant vascular findings are present. Retroaortic left renal vein again noted. No enlarged abdominal or pelvic lymph nodes. Reproductive: Prostate is unremarkable. Other: No abdominal wall hernia or abnormality. No abdominopelvic ascites. No pneumoperitoneum. Musculoskeletal: No acute or significant osseous findings. IMPRESSION: 1. No acute intra-abdominal process. 2. Questionable focal wall thickening of the proximal transverse colon. While this could be related to underdistention, further evaluation with colonoscopy should be considered to exclude underlying neoplasm. These results will be called to the ordering clinician or representative by the Radiologist Assistant, and communication documented in the PACS or zVision Dashboard. Electronically Signed   By: WOrville GovernD.  On: 09/18/2019 13:44    Assessment and Plan:   EDDER BELLANCA is a 44 y.o. y/o male has been referred for abnormal findings on the CT scan which was performed to evaluate for abdominal distention and diarrhea.  Colon wall thickening in the transverse colon was noted radiologist recommended colonoscopy to rule out underlying neoplasm.  Labs seem to be within normal limits. His other symptoms suggestive of irritable bowel syndrome with diarrhea.  Plan 1.  Diagnostic colonoscopy. 2.  Low FODMAP diet.  Avoid all artificial sugars and sweeteners.  Celiac serology. 3. If no better in 10 to 14 days we'll consider a course of  Xifaxan.  I have discussed alternative options, risks & benefits,  which include, but are not limited to, bleeding, infection, perforation,respiratory complication & drug reaction.  The patient agrees with this plan & written consent will be obtained.    Follow up in 2 weeks telephone visit  Dr Jonathon Bellows MD,MRCP(U.K)

## 2019-09-24 NOTE — Addendum Note (Signed)
Addended by: Dorethea Clan on: 09/24/2019 03:32 PM   Modules accepted: Orders

## 2019-09-26 ENCOUNTER — Telehealth: Payer: Self-pay

## 2019-09-26 ENCOUNTER — Encounter: Payer: Self-pay | Admitting: Gastroenterology

## 2019-09-26 DIAGNOSIS — R894 Abnormal immunological findings in specimens from other organs, systems and tissues: Secondary | ICD-10-CM

## 2019-09-26 LAB — CELIAC DISEASE PANEL
Endomysial IgA: POSITIVE — AB
IgA/Immunoglobulin A, Serum: 331 mg/dL (ref 90–386)
Transglutaminase IgA: >100 U/mL — ABNORMAL HIGH (ref 0–3)

## 2019-09-26 NOTE — Telephone Encounter (Signed)
Called and left a message for call back. Called Trish and added EGD to colonoscopy on 03/15/202.

## 2019-09-26 NOTE — Telephone Encounter (Signed)
Patient verbalized understanding of lab results

## 2019-09-26 NOTE — Telephone Encounter (Signed)
-----   Message from Jonathon Bellows, MD sent at 09/26/2019  8:44 AM EST ----- Please inform the patient that his blood work suggest that he may have celiac disease but it is not confirmed at this point.  He would need to have an upper endoscopy performed at the same time that he is being scheduled for a colonoscopy.  Please ad vise him strictly not to change his diet until we make a formal diagnosis.  If he avoids gluten at this point his next test may turn negative complicating his diagnosis.  Continue to eat the way he has always been eating till we make a formal diagnos is.  Please add upper endoscopy to his upcoming colonoscopy please thank you

## 2019-09-26 NOTE — Progress Notes (Signed)
Please inform the patient that his blood work suggest that he may have celiac disease but it is not confirmed at this point.  He would need to have an upper endoscopy performed at the same time that he is being scheduled for a colonoscopy.  Please advise him strictly not to change his diet until we make a formal diagnosis.  If he avoids gluten at this point his next test may turn negative complicating his diagnosis.  Continue to eat the way he has always been eating till we make a formal diagnosis.  Please add upper endoscopy to his upcoming colonoscopy please thank you

## 2019-10-02 LAB — GI PROFILE, STOOL, PCR

## 2019-10-02 LAB — CALPROTECTIN, FECAL: Calprotectin, Fecal: 54 ug/g (ref 0–120)

## 2019-10-02 LAB — CLOSTRIDIUM DIFFICILE BY PCR: Toxigenic C. Difficile by PCR: NEGATIVE

## 2019-10-09 ENCOUNTER — Telehealth: Payer: Self-pay | Admitting: Gastroenterology

## 2019-10-09 NOTE — Telephone Encounter (Signed)
Kasey from [facility] left vm to obtain pre-auth# for procedure 10/15/19 with Dr. Vicente Males cb [phone] ext [phone]

## 2019-10-11 ENCOUNTER — Other Ambulatory Visit
Admission: RE | Admit: 2019-10-11 | Discharge: 2019-10-11 | Disposition: A | Discharge status: Home / Self Care | Payer: 59 | Source: Ambulatory Visit | Attending: Gastroenterology | Admitting: Gastroenterology

## 2019-10-11 ENCOUNTER — Other Ambulatory Visit: Payer: Self-pay

## 2019-10-11 DIAGNOSIS — Z20822 Contact with and (suspected) exposure to covid-19: Secondary | ICD-10-CM | POA: Insufficient documentation

## 2019-10-11 DIAGNOSIS — Z01812 Encounter for preprocedural laboratory examination: Secondary | ICD-10-CM | POA: Insufficient documentation

## 2019-10-11 LAB — SARS CORONAVIRUS 2 (TAT 6-24 HRS): SARS Coronavirus 2: NEGATIVE

## 2019-10-15 ENCOUNTER — Ambulatory Visit
Admission: RE | Admit: 2019-10-15 | Discharge: 2019-10-15 | Disposition: A | Discharge status: Home / Self Care | Payer: 59 | Attending: Gastroenterology | Admitting: Gastroenterology

## 2019-10-15 ENCOUNTER — Ambulatory Visit: Payer: 59 | Admitting: Certified Registered Nurse Anesthetist

## 2019-10-15 ENCOUNTER — Encounter: Payer: Self-pay | Admitting: Gastroenterology

## 2019-10-15 ENCOUNTER — Encounter
Admission: RE | Disposition: A | Discharge status: Home / Self Care | Payer: Self-pay | Source: Home / Self Care | Attending: Gastroenterology

## 2019-10-15 ENCOUNTER — Other Ambulatory Visit: Payer: Self-pay

## 2019-10-15 DIAGNOSIS — Z79899 Other long term (current) drug therapy: Secondary | ICD-10-CM | POA: Diagnosis not present

## 2019-10-15 DIAGNOSIS — K219 Gastro-esophageal reflux disease without esophagitis: Secondary | ICD-10-CM | POA: Insufficient documentation

## 2019-10-15 DIAGNOSIS — Z833 Family history of diabetes mellitus: Secondary | ICD-10-CM | POA: Diagnosis not present

## 2019-10-15 DIAGNOSIS — J45909 Unspecified asthma, uncomplicated: Secondary | ICD-10-CM | POA: Insufficient documentation

## 2019-10-15 DIAGNOSIS — E109 Type 1 diabetes mellitus without complications: Secondary | ICD-10-CM | POA: Insufficient documentation

## 2019-10-15 DIAGNOSIS — Z794 Long term (current) use of insulin: Secondary | ICD-10-CM | POA: Diagnosis not present

## 2019-10-15 DIAGNOSIS — R768 Other specified abnormal immunological findings in serum: Secondary | ICD-10-CM | POA: Diagnosis not present

## 2019-10-15 DIAGNOSIS — K635 Polyp of colon: Secondary | ICD-10-CM | POA: Diagnosis not present

## 2019-10-15 DIAGNOSIS — G43909 Migraine, unspecified, not intractable, without status migrainosus: Secondary | ICD-10-CM | POA: Insufficient documentation

## 2019-10-15 DIAGNOSIS — R197 Diarrhea, unspecified: Secondary | ICD-10-CM | POA: Diagnosis not present

## 2019-10-15 DIAGNOSIS — G4730 Sleep apnea, unspecified: Secondary | ICD-10-CM | POA: Diagnosis not present

## 2019-10-15 DIAGNOSIS — D124 Benign neoplasm of descending colon: Secondary | ICD-10-CM | POA: Diagnosis not present

## 2019-10-15 DIAGNOSIS — Z8261 Family history of arthritis: Secondary | ICD-10-CM | POA: Insufficient documentation

## 2019-10-15 DIAGNOSIS — R011 Cardiac murmur, unspecified: Secondary | ICD-10-CM | POA: Insufficient documentation

## 2019-10-15 DIAGNOSIS — K621 Rectal polyp: Secondary | ICD-10-CM | POA: Insufficient documentation

## 2019-10-15 DIAGNOSIS — Z888 Allergy status to other drugs, medicaments and biological substances status: Secondary | ICD-10-CM | POA: Insufficient documentation

## 2019-10-15 DIAGNOSIS — E039 Hypothyroidism, unspecified: Secondary | ICD-10-CM | POA: Diagnosis not present

## 2019-10-15 DIAGNOSIS — Z8249 Family history of ischemic heart disease and other diseases of the circulatory system: Secondary | ICD-10-CM | POA: Insufficient documentation

## 2019-10-15 HISTORY — PX: COLONOSCOPY WITH PROPOFOL: SHX5780

## 2019-10-15 HISTORY — PX: ESOPHAGOGASTRODUODENOSCOPY (EGD) WITH PROPOFOL: SHX5813

## 2019-10-15 LAB — GLUCOSE, CAPILLARY
Glucose-Capillary: 60 mg/dL — ABNORMAL LOW (ref 70–99)
Glucose-Capillary: 83 mg/dL (ref 70–99)
Glucose-Capillary: 148 mg/dL — ABNORMAL HIGH (ref 70–99)

## 2019-10-15 SURGERY — COLONOSCOPY WITH PROPOFOL
Anesthesia: General

## 2019-10-15 MED ORDER — PROPOFOL 500 MG/50ML IV EMUL
INTRAVENOUS | Status: AC
  Filled 2019-10-15: qty 50

## 2019-10-15 MED ORDER — PROPOFOL 500 MG/50ML IV EMUL
INTRAVENOUS | Status: DC | PRN
  Administered 2019-10-15: 125 ug/kg/min via INTRAVENOUS
  Administered 2019-10-15: 30 mg via INTRAVENOUS

## 2019-10-15 MED ORDER — SODIUM CHLORIDE 0.9 % IV SOLN: INTRAVENOUS | Status: DC | PRN

## 2019-10-15 MED ORDER — PROPOFOL 10 MG/ML IV BOLUS
INTRAVENOUS | Status: AC
  Filled 2019-10-15: qty 20

## 2019-10-15 MED ORDER — LIDOCAINE HCL (CARDIAC) PF 100 MG/5ML IV SOSY
PREFILLED_SYRINGE | INTRAVENOUS | Status: DC | PRN
  Administered 2019-10-15: 50 mg via INTRAVENOUS

## 2019-10-15 NOTE — Op Note (Signed)
[facility] Gastroenterology Patient Name: Eric Hayden Procedure Date: 10/15/2019 8:43 AM MRN: [id number] Account #: [id number] Date of Birth: 1975-08-06 Admit Type: Outpatient Age: 44 Room: [facility] ENDO ROOM 4 Gender: Male Note Status: Finalized Procedure:             Colonoscopy Indications:           Clinically significant diarrhea of unexplained origin Providers:             Jonathon Bellows MD, MD Referring MD:          Einar Pheasant, MD (Referring MD) Medicines:             Monitored Anesthesia Care Complications:         No immediate complications. Procedure:             Pre-Anesthesia Assessment:                        - Prior to the procedure, a History and Physical was                         performed, and patient medications, allergies and                         sensitivities were reviewed. The patient's tolerance                         of previous anesthesia was reviewed.                        - The risks and benefits of the procedure and the                         sedation options and risks were discussed with the                         patient. All questions were answered and informed                         consent was obtained.                        - ASA Grade Assessment: II - A patient with mild                         systemic disease.                        After obtaining informed consent, the colonoscope was                         passed under direct vision. Throughout the procedure,                         the patient's blood pressure, pulse, and oxygen                         saturations were monitored continuously. The                         Colonoscope was introduced through the anus  and                         advanced to the the terminal ileum. The colonoscopy                         was performed with ease. The patient tolerated the                         procedure well. The quality of the bowel preparation   was excellent. Findings:      The perianal and digital rectal examinations were normal.      Two sessile polyps were found in the rectum and descending colon. The       polyps were 4 to 5 mm in size. These polyps were removed with a cold       snare. Resection and retrieval were complete.      The terminal ileum appeared normal. This was biopsied with a cold       forceps for histology.      Normal mucosa was found in the entire colon. Biopsies for histology were       taken with a cold forceps from the entire colon for evaluation of       microscopic colitis.      The exam was otherwise without abnormality on direct and retroflexion       views. Impression:            - Two 4 to 5 mm polyps in the rectum and in the                         descending colon, removed with a cold snare. Resected                         and retrieved.                        - The examined portion of the ileum was normal.                         Biopsied.                        - Normal mucosa in the entire examined colon. Biopsied.                        - The examination was otherwise normal on direct and                         retroflexion views. Recommendation:        - Discharge patient to home (with escort).                        - Resume previous diet.                        - Continue present medications.                        - Await pathology results.                        -  Repeat colonoscopy for surveillance based on                         pathology results.                        - Return to my office as previously scheduled. Procedure Code(s):     --- Professional ---                        [phone], Colonoscopy, flexible; with removal of                         tumor(s), polyp(s), or other lesion(s) by snare                         technique                        45380, 39, Colonoscopy, flexible; with biopsy, single                         or multiple Diagnosis Code(s):     ---  Professional ---                        K62.1, Rectal polyp                        K63.5, Polyp of colon                        R19.7, Diarrhea, unspecified CPT copyright 2019 American Medical Association. All rights reserved. The codes documented in this report are preliminary and upon coder review may  be revised to meet current compliance requirements. Jonathon Bellows, MD Jonathon Bellows MD, MD 10/15/2019 9:14:33 AM This report has been signed electronically. Number of Addenda: 0 Note Initiated On: 10/15/2019 8:43 AM Scope Withdrawal Time: 0 hours 13 minutes 33 seconds  Total Procedure Duration: 0 hours 17 minutes 2 seconds  Estimated Blood Loss:  Estimated blood loss: none.      [facility]

## 2019-10-15 NOTE — Op Note (Signed)
[facility] Gastroenterology Patient Name: Eric Hayden Procedure Date: 10/15/2019 8:43 AM MRN: [id number] Account #: [id number] Date of Birth: [birth year] Admit Type: Outpatient Age: 44 Room: [facility] ENDO ROOM 4 Gender: Male Note Status: Finalized Procedure:             Upper GI endoscopy Indications:           Positive celiac serologies Providers:             Jonathon Bellows MD, MD Referring MD:          Einar Pheasant, MD (Referring MD) Medicines:             Monitored Anesthesia Care Complications:         No immediate complications. Procedure:             Pre-Anesthesia Assessment:                        - Prior to the procedure, a History and Physical was                         performed, and patient medications, allergies and                         sensitivities were reviewed. The patient's tolerance                         of previous anesthesia was reviewed.                        - The risks and benefits of the procedure and the                         sedation options and risks were discussed with the                         patient. All questions were answered and informed                         consent was obtained.                        - ASA Grade Assessment: II - A patient with mild                         systemic disease.                        After obtaining informed consent, the endoscope was                         passed under direct vision. Throughout the procedure,                         the patient's blood pressure, pulse, and oxygen                         saturations were monitored continuously. The Endoscope                         was introduced through the mouth, and  advanced to the                         third part of duodenum. The upper GI endoscopy was                         accomplished without difficulty. The patient tolerated                         the procedure well. Findings:      The esophagus was normal.      The stomach  was normal.      The cardia and gastric fundus were normal on retroflexion.      The examined duodenum was normal. Biopsies for histology were taken with       a cold forceps for evaluation of celiac disease. Impression:            - Normal esophagus.                        - Normal stomach.                        - Normal examined duodenum. Biopsied. Recommendation:        - Await pathology results.                        - Perform a colonoscopy today. Procedure Code(s):     --- Professional ---                        [id number], Esophagogastroduodenoscopy, flexible,                         transoral; with biopsy, single or multiple Diagnosis Code(s):     --- Professional ---                        R76.8, Other specified abnormal immunological findings                         in serum CPT copyright 2019 American Medical Association. All rights reserved. The codes documented in this report are preliminary and upon coder review may  be revised to meet current compliance requirements. Jonathon Bellows, MD Jonathon Bellows MD, MD 10/15/2019 8:54:57 AM This report has been signed electronically. Number of Addenda: 0 Note Initiated On: 10/15/2019 8:43 AM Estimated Blood Loss:  Estimated blood loss: none.      [facility]

## 2019-10-15 NOTE — Transfer of Care (Signed)
Immediate Anesthesia Transfer of Care Note  Patient: Eric Hayden [facility]  Procedure(s) Performed: COLONOSCOPY WITH PROPOFOL (N/A ) ESOPHAGOGASTRODUODENOSCOPY (EGD) WITH PROPOFOL (N/A )  Patient Location: PACU  Anesthesia Type:General  Level of Consciousness: awake, alert  and oriented  Airway & Oxygen Therapy: Patient Spontanous Breathing  Post-op Assessment: Report given to RN and Post -op Vital signs reviewed and stable  Post vital signs: Reviewed and stable  Last Vitals:  Vitals Value Taken Time  BP    Temp    Pulse    Resp    SpO2      Last Pain:  Vitals:   10/15/19 0749  TempSrc: Oral  PainSc: 0-No pain         Complications: No apparent anesthesia complications

## 2019-10-15 NOTE — Anesthesia Preprocedure Evaluation (Signed)
Anesthesia Evaluation  Patient identified by MRN, date of birth, ID band Patient awake    Reviewed: Allergy & Precautions, NPO status , Patient's Chart, lab work & pertinent test results  History of Anesthesia Complications Negative for: history of anesthetic complications  Airway Mallampati: II  TM Distance: >3 FB Neck ROM: Full    Dental no notable dental hx.    Pulmonary sleep apnea ,    breath sounds clear to auscultation- rhonchi (-) wheezing      Cardiovascular Exercise Tolerance: Good (-) hypertension(-) CAD, (-) Past MI, (-) Cardiac Stents and (-) CABG  Rhythm:Regular Rate:Normal - Systolic murmurs and - Diastolic murmurs    Neuro/Psych  Headaches, negative psych ROS   GI/Hepatic Neg liver ROS, GERD  ,  Endo/Other  diabetes, Type 1, Insulin DependentHypothyroidism   Renal/GU negative Renal ROS     Musculoskeletal negative musculoskeletal ROS (+)   Abdominal (+) - obese,   Peds  Hematology negative hematology ROS (+)   Anesthesia Other Findings Past Medical History: No date: Asthma No date: Cancer ([place])     Comment:  appendiceal No date: Chicken pox No date: GERD (gastroesophageal reflux disease) No date: Heart murmur No date: Hx of oral aphthous ulcers No date: Hypothyroidism No date: Migraines No date: Type 1 diabetes mellitus ([place])'   Reproductive/Obstetrics                             Anesthesia Physical Anesthesia Plan  ASA: II  Anesthesia Plan: General   Post-op Pain Management:    Induction: Intravenous  PONV Risk Score and Plan: 1 and Propofol infusion  Airway Management Planned: Natural Airway  Additional Equipment:   Intra-op Plan:   Post-operative Plan:   Informed Consent: I have reviewed the patients History and Physical, chart, labs and discussed the procedure including the risks, benefits and alternatives for the proposed anesthesia with the  patient or authorized representative who has indicated his/her understanding and acceptance.     Dental advisory given  Plan Discussed with: CRNA and Anesthesiologist  Anesthesia Plan Comments:         Anesthesia Quick Evaluation

## 2019-10-15 NOTE — H&P (Signed)
Jonathon Bellows, MD [address] [place], [place], [place], [address] [address], [place], [place], [place], [address] Phone: [phone]  Fax: [phone]  Primary Care Physician:  Einar Pheasant, MD   Pre-Procedure History & Physical: HPI:  Eric Hayden is a 44 y.o. male is here for an endoscopy and colonoscopy    Past Medical History:  Diagnosis Date  . Asthma   . Cancer (H[facility])    appendiceal  . Chicken pox   . GERD (gastroesophageal reflux disease)   . Heart murmur   . Hx of oral aphthous ulcers   . Hypothyroidism   . Migraines   . Type 1 diabetes mellitus ([place])     Past Surgical History:  Procedure Laterality Date  . APPENDECTOMY  2012  . TONSILLECTOMY AND ADENOIDECTOMY  2007  . UPPER GI ENDOSCOPY      Prior to Admission medications   Medication Sig Start Date End Date Taking? Authorizing Provider  amitriptyline (ELAVIL) 25 MG tablet TAKE ONE TABLET AT BEDTIME 07/17/18  Yes Einar Pheasant, MD  atorvastatin (LIPITOR) 10 MG tablet TAKE ONE TABLET BY MOUTH EVERY DAY 07/05/14  Yes Einar Pheasant, MD  carbamide peroxide (DEBROX) 6.5 % otic solution 3-4 drops in both ears q day.  Massage approximately 5 minutes 04/12/16  Yes Scott, Randell Patient, MD  Fremanezumab-vfrm (AJOVY) 225 MG/1.5ML SOAJ Inject 225 mg into the skin every 30 (thirty) days. 09/03/19  Yes Einar Pheasant, MD  gabapentin (NEURONTIN) 300 MG capsule Start 360m tablet at bedtime. Titrate as needed to max 2 tabs twice daily. 08/20/16  Yes [provider]  levothyroxine (SYNTHROID, LEVOTHROID) 137 MCG tablet  09/22/17  Yes [provider]  NOVOLOG 100 UNIT/ML injection  12/05/17  Yes [provider]  omeprazole (PRILOSEC) 20 MG capsule TAKE 1 CAPSULE BY MOUTH EVERY DAY 07/24/18  Yes SEinar Pheasant MD  topiramate (TOPAMAX) 50 MG tablet  12/08/17  Yes [provider]  CONTOUR NEXT TEST test strip  12/12/17   [provider]  insulin lispro (HUMALOG) 100 UNIT/ML  injection As directed.  Dispense 4 vials. 08/23/13   SEinar Pheasant MD  ranitidine (ZANTAC) 150 MG tablet Take 150 mg by mouth at bedtime.    [provider]    Allergies as of 09/25/2019 - Review Complete 09/24/2019  Allergen Reaction Noted  . Augmentin [amoxicillin-pot clavulanate] Nausea And Vomiting 11/16/2012  . Levaquin [levofloxacin in d5w] Swelling 11/16/2012    Family History  Problem Relation Age of Onset  . Arthritis Maternal Grandmother   . Hyperlipidemia Maternal Grandmother   . Heart disease Maternal Grandmother   . Hypertension Maternal Grandmother   . Diabetes Maternal Grandmother        type 2  . Heart attack Maternal Grandmother   . Arthritis Maternal Grandfather   . Heart disease Maternal Grandfather   . Hypertension Maternal Grandfather   . Heart attack Maternal Grandfather   . Arthritis Mother   . Hyperlipidemia Mother   . Hypertension Mother   . Diabetes Mother        type 2  . Hyperlipidemia Father   . Arthritis Paternal Grandmother   . Arthritis Paternal Grandfather   . Heart attack Paternal Aunt     Social History   Socioeconomic History  . Marital status: Married    Spouse name: Not on file  . Number of children: 2  . Years of education: Not on file  . Highest education level: Not on file  Occupational History  . Not on file  Tobacco Use  . Smoking status: Never Smoker  . Smokeless tobacco: Never Used  Substance and Sexual Activity  . Alcohol use: No    Alcohol/week: 0.0 standard drinks  . Drug use: No  . Sexual activity: Not on file  Other Topics Concern  . Not on file  Social History Narrative  . Not on file   Social Determinants of Health   Financial Resource Strain:   . Difficulty of Paying Living Expenses:   Food Insecurity:   . Worried About Charity fundraiser in the Last Year:   . Arboriculturist in the Last Year:   Transportation Needs:   . Film/video editor (Medical):   Marland Kitchen Lack of Transportation  (Non-Medical):   Physical Activity:   . Days of Exercise per Week:   . Minutes of Exercise per Session:   Stress:   . Feeling of Stress :   Social Connections:   . Frequency of Communication with Friends and Family:   . Frequency of Social Gatherings with Friends and Family:   . Attends Religious Services:   . Active Member of Clubs or Organizations:   . Attends Archivist Meetings:   Marland Kitchen Marital Status:   Intimate Partner Violence:   . Fear of Current or Ex-Partner:   . Emotionally Abused:   Marland Kitchen Physically Abused:   . Sexually Abused:     Review of Systems: See HPI, otherwise negative ROS  Physical Exam: BP (!) 138/93   Pulse 89   Temp 98.3 F (36.8 C) (Oral)   Resp 18   Ht 5' 7"  (1.702 m)   Wt 81.6 kg   SpO2 100%   BMI 28.19 kg/m  General:   Alert,  pleasant and cooperative in NAD Head:  Normocephalic and atraumatic. Neck:  Supple; no masses or thyromegaly. Lungs:  Clear throughout to auscultation, normal respiratory effort.    Heart:  +S1, +S2, Regular rate and rhythm, No edema. Abdomen:  Soft, nontender and nondistended. Normal bowel sounds, without guarding, and without rebound.   Neurologic:  Alert and  oriented x4;  grossly normal neurologically.  Impression/Plan: Eric Hayden is here for an endoscopy and colonoscopy  to be performed for  evaluation of diarrhea    Risks, benefits, limitations, and alternatives regarding endoscopy have been reviewed with the patient.  Questions have been answered.  All parties agreeable.   Jonathon Bellows, MD  10/15/2019, 8:37 AM

## 2019-10-15 NOTE — Anesthesia Postprocedure Evaluation (Signed)
Anesthesia Post Note  Patient: Shelby Anderle [facility]  Procedure(s) Performed: COLONOSCOPY WITH PROPOFOL (N/A ) ESOPHAGOGASTRODUODENOSCOPY (EGD) WITH PROPOFOL (N/A )  Patient location during evaluation: Endoscopy Anesthesia Type: General Level of consciousness: awake and alert and oriented Pain management: pain level controlled Vital Signs Assessment: post-procedure vital signs reviewed and stable Respiratory status: spontaneous breathing, nonlabored ventilation and respiratory function stable Cardiovascular status: blood pressure returned to baseline and stable Postop Assessment: no signs of nausea or vomiting Anesthetic complications: no     Last Vitals:  Vitals:   10/15/19 0927 10/15/19 0937  BP: (!) 125/93 (!) 138/94  Pulse: 91 87  Resp: (!) 22 (!) 21  Temp:    SpO2: 98% 99%    Last Pain:  Vitals:   10/15/19 0937  TempSrc:   PainSc: 0-No pain                 Jenalyn Girdner

## 2019-10-16 ENCOUNTER — Telehealth: Payer: Self-pay

## 2019-10-16 ENCOUNTER — Encounter: Payer: Self-pay | Admitting: *Deleted

## 2019-10-16 ENCOUNTER — Encounter: Payer: Self-pay | Admitting: Gastroenterology

## 2019-10-16 LAB — SURGICAL PATHOLOGY

## 2019-10-16 NOTE — Progress Notes (Signed)
Eric Hayden   Inform him that the biopsies confirm that he does have celiac disease.  Commence on a gluten-free diet.  Send him patient information.  He also has a tubular adenoma and hence repeat colonoscopy in 5 years.  I would suggest him to follow-up with me in the outpatient in 6 weeks time to discuss more about celiac disease and discuss long-term management.  Please send him patient information about celiac disease from up-to-date.  C.c Einar Pheasant, MD   Dr Jonathon Bellows MD,MRCP [facility]) Gastroenterology/Hepatology Pager: [phone]

## 2019-10-16 NOTE — Telephone Encounter (Signed)
Spoke with pt and informed him of biopsy results and Dr. Georgeann Oppenheim recommendations. Pt understands and agrees. Pt already has a follow up visit scheduled.

## 2019-10-16 NOTE — Telephone Encounter (Signed)
-----   Message from Eric Bellows, MD sent at 10/16/2019 11:33 AM EDT ----- [name]   Inform him that the biopsies confirm that he does have celiac disease.  Commence on a gluten-free diet.  Send him patient information.  He also has a tubular adenoma and hence repeat colonoscopy in 5 years.  I would suggest him to follow-up with me i n the outpatient in 6 weeks time to discuss more about celiac disease and discuss long-term management.  Please send him patient information about celiac disease from up-to-date.  C.c Einar Pheasant, MD   Dr Eric Bellows MD,MRCP [facility]) Gastroenterology/Hepatology Pager: [phone]

## 2019-10-24 ENCOUNTER — Ambulatory Visit (INDEPENDENT_AMBULATORY_CARE_PROVIDER_SITE_OTHER): Payer: 59 | Admitting: Gastroenterology

## 2019-10-24 ENCOUNTER — Other Ambulatory Visit: Payer: Self-pay

## 2019-10-24 ENCOUNTER — Encounter: Payer: Self-pay | Admitting: Gastroenterology

## 2019-10-24 VITALS — BP 133/80 | HR 79 | Temp 97.8°F | Ht 67.0 in | Wt 184.0 lb

## 2019-10-24 DIAGNOSIS — K900 Celiac disease: Secondary | ICD-10-CM

## 2019-10-24 NOTE — Progress Notes (Signed)
Jonathon Bellows MD, MRCP(U.K) [address]  [place]  [place], [place] [address]  Main: [phone]  Fax: [phone]   Primary Care Physician: Einar Pheasant, MD  Primary Gastroenterologist:  Dr. Jonathon Bellows   Celiac disease follow up   HPI: Eric Hayden is a 44 y.o. male    Summary of history :  He was initially seen and referred on 09/24/2019 for abnormal CT scan of the abdomen and bloating.  He had a history of diarrhea for a few months and underwent a CT scan of the abdomen in February 2021. Questionable focal wall thickening of the proximal transverse colon noted.  Further evaluation with colonoscopy is recommended.  09/05/2019 hemoglobin 15.4 g, hepatic function was normal except a mildly elevated alkaline phosphatase of 124.  TSH was normal.  Amylase and lipase were also normal.  At that time his main complaints are abdominal bloating followed by diarrhea ongoing for a few months.  Lots of rumbling and gurgling sensations in his abdomen.  No weight loss.  Interval history 09/24/2019-10/24/2019  09/26/2019: Stool for C. difficile PCR was negative, fecal calprotectin was 54, GI PCR of stool was negative.  Tissue transglutaminase IgA was greater than 100.  10/15/2019: EGD: Biopsies of the duodenum showedBlunted enteric mucosa with increased intraepithelial lymphocytes compatible with celiac disease.  He also underwent a colonoscopy on the same day and biopsies of the terminal ileum and random colonic biopsies were normal.  A polyp was resected which was a tubular adenoma.  He went on a gluten-free diet after his recent endoscopy and since then has had no diarrhea or bloating and his GI symptoms have overall subsided significantly.  He has a brother who is not been screened for celiac disease and he has been educated as well whom I suggested to get screened.  Current Outpatient Medications  Medication Sig Dispense Refill  . amitriptyline (ELAVIL) 25 MG tablet TAKE ONE TABLET AT  BEDTIME 90 tablet 1  . atorvastatin (LIPITOR) 10 MG tablet TAKE ONE TABLET BY MOUTH EVERY DAY 30 tablet 6  . carbamide peroxide (DEBROX) 6.5 % otic solution 3-4 drops in both ears q day.  Massage approximately 5 minutes 15 mL 0  . CONTOUR NEXT TEST test strip   1  . Fremanezumab-vfrm (AJOVY) 225 MG/1.5ML SOAJ Inject 225 mg into the skin every 30 (thirty) days. 1 pen 6  . gabapentin (NEURONTIN) 300 MG capsule Start 379m tablet at bedtime. Titrate as needed to max 2 tabs twice daily.    . insulin lispro (HUMALOG) 100 UNIT/ML injection As directed.  Dispense 4 vials. 10 mL 5  . levothyroxine (SYNTHROID, LEVOTHROID) 137 MCG tablet   1  . NOVOLOG 100 UNIT/ML injection   11  . omeprazole (PRILOSEC) 20 MG capsule TAKE 1 CAPSULE BY MOUTH EVERY DAY 90 capsule 0  . ranitidine (ZANTAC) 150 MG tablet Take 150 mg by mouth at bedtime.    . topiramate (TOPAMAX) 50 MG tablet   7   No current facility-administered medications for this visit.    Allergies as of 10/24/2019 - Review Complete 10/15/2019  Allergen Reaction Noted  . Augmentin [amoxicillin-pot clavulanate] Nausea And Vomiting 11/16/2012  . Levaquin [levofloxacin in d5w] Swelling 11/16/2012    ROS:  General: Negative for anorexia, weight loss, fever, chills, fatigue, weakness. ENT: Negative for hoarseness, difficulty swallowing , nasal congestion. CV: Negative for chest pain, angina, palpitations, dyspnea on exertion, peripheral edema.  Respiratory: Negative for dyspnea at rest, dyspnea on exertion, cough,  sputum, wheezing.  GI: See history of present illness. GU:  Negative for dysuria, hematuria, urinary incontinence, urinary frequency, nocturnal urination.  Endo: Negative for unusual weight change.    Physical Examination:   There were no vitals taken for this visit.  General: Well-nourished, well-developed in no acute distress.  Eyes: No icterus. Conjunctivae pink. Psych: Alert and cooperative, normal mood and affect.   Imaging  Studies: No results found.  Assessment and Plan:   Eric Hayden is a 44 y.o. y/o male here to follow-up to his initial visit when he presented with diarrhea ongoing for a few months.  He has a new diagnosis of celiac disease confirmed by antibody and biopsy results.  Plan 1.  Patient information provided and counseling 2.  Refer to dietitian to counsel further on gluten-free diet. 3.  Suggest to have his first-degree relatives screened for celiac disease. 4.  DEXA bone scan 5.  Suggested to visit the rally celiac disease support group website for further information and meet ups. 6.  Vitamin A,D,E B12, copper, zinc, folic acid, iron studies    Dr Jonathon Bellows  MD,MRCP [facility]) Follow up in 4-6  months

## 2019-11-13 NOTE — Telephone Encounter (Signed)
Patient

## 2019-12-07 ENCOUNTER — Encounter: Payer: Self-pay | Admitting: Internal Medicine

## 2019-12-13 LAB — VITAMIN K1, SERUM: VITAMIN K1: 0.31 nmol/L (ref 0.22–4.88)

## 2019-12-13 LAB — VITAMIN E
Vitamin E (Alpha Tocopherol): 6.8 mg/L — ABNORMAL LOW (ref 7.0–25.1)
Vitamin E(Gamma Tocopherol): 0.8 mg/L (ref 0.5–5.5)

## 2019-12-13 LAB — HEPATIC FUNCTION PANEL
ALT: 23 IU/L (ref 0–44)
AST: 21 IU/L (ref 0–40)
Albumin: 4.4 g/dL (ref 4.0–5.0)
Alkaline Phosphatase: 134 IU/L — ABNORMAL HIGH (ref 39–117)
Bilirubin Total: 0.4 mg/dL (ref 0.0–1.2)
Bilirubin, Direct: 0.12 mg/dL (ref 0.00–0.40)
Total Protein: 6.8 g/dL (ref 6.0–8.5)

## 2019-12-13 LAB — IRON,TIBC AND FERRITIN PANEL
Ferritin: 33 ng/mL (ref 30–400)
Iron Saturation: 22 % (ref 15–55)
Iron: 64 ug/dL (ref 38–169)
Total Iron Binding Capacity: 285 ug/dL (ref 250–450)
UIBC: 221 ug/dL (ref 111–343)

## 2019-12-13 LAB — HEPATITIS B SURFACE ANTIBODY,QUALITATIVE: Hep B Surface Ab, Qual: NONREACTIVE

## 2019-12-13 LAB — COPPER, SERUM: Copper: 120 ug/dL (ref 69–132)

## 2019-12-13 LAB — HEPATITIS A ANTIBODY, TOTAL: hep A Total Ab: NEGATIVE

## 2019-12-13 LAB — VITAMIN D 1,25 DIHYDROXY
Vitamin D 1, 25 (OH)2 Total: 63 pg/mL
Vitamin D2 1, 25 (OH)2: <10 pg/mL
Vitamin D3 1, 25 (OH)2: 63 pg/mL

## 2019-12-13 LAB — VITAMIN B12: Vitamin B-12: 590 pg/mL (ref 232–1245)

## 2019-12-13 LAB — FOLATE: Folate: <2 ng/mL — ABNORMAL LOW (ref >3.0)

## 2019-12-13 LAB — ZINC: Zinc: 88 ug/dL (ref 44–115)

## 2019-12-13 LAB — VITAMIN A: Vitamin A: 42 ug/dL (ref 20.1–62.0)

## 2019-12-24 ENCOUNTER — Encounter: Payer: Self-pay | Admitting: Internal Medicine

## 2019-12-26 MED ORDER — TOPIRAMATE 25 MG PO TABS: ORAL_TABLET | ORAL | 1 refills | Status: DC

## 2019-12-26 NOTE — Telephone Encounter (Signed)
rx sent in for topamax

## 2020-01-08 ENCOUNTER — Telehealth: Payer: Self-pay

## 2020-01-08 NOTE — Telephone Encounter (Signed)
-----   Message from Jonathon Bellows, MD sent at 01/01/2020  9:52 AM EDT ----- One of the liver function tests that is the alkaline phosphatase is i elevated.Please check1.  Calcium, PTH2.  GGT

## 2020-01-21 ENCOUNTER — Other Ambulatory Visit: Payer: Self-pay | Admitting: Internal Medicine

## 2020-02-29 ENCOUNTER — Other Ambulatory Visit: Payer: Self-pay | Admitting: *Deleted

## 2020-02-29 NOTE — Progress Notes (Signed)
Quick Abstraction A1c.

## 2020-03-10 ENCOUNTER — Encounter: Payer: Self-pay | Admitting: Internal Medicine

## 2020-03-10 DIAGNOSIS — R748 Abnormal levels of other serum enzymes: Secondary | ICD-10-CM

## 2020-03-10 DIAGNOSIS — G4730 Sleep apnea, unspecified: Secondary | ICD-10-CM

## 2020-03-10 DIAGNOSIS — Z125 Encounter for screening for malignant neoplasm of prostate: Secondary | ICD-10-CM

## 2020-03-10 DIAGNOSIS — E1040 Type 1 diabetes mellitus with diabetic neuropathy, unspecified: Secondary | ICD-10-CM

## 2020-03-10 DIAGNOSIS — E039 Hypothyroidism, unspecified: Secondary | ICD-10-CM

## 2020-03-10 DIAGNOSIS — E7800 Pure hypercholesterolemia, unspecified: Secondary | ICD-10-CM

## 2020-03-11 NOTE — Telephone Encounter (Signed)
Please call pt and notify him that I have placed orders for his labs.  He will need a fasting lab appt scheduled.  He was also asking about needing a new cpap.  Given has been 15 years, will need pulmonary referral for reevaluation and will probably need a new sleep study (they can arrange a home study).  Just let me know if agreeable and I will place the order for the referral to pulmonary.  Regarding the persistent issues with vertigo and headaches, I do recommend f/u with Dr Manuella Ghazi ( who he is seeing for his headaches).

## 2020-03-11 NOTE — Telephone Encounter (Signed)
Orders placed for labs

## 2020-03-12 NOTE — Telephone Encounter (Signed)
Order placed for pulmonary referral.  

## 2020-03-14 ENCOUNTER — Other Ambulatory Visit: Payer: 59

## 2020-03-16 ENCOUNTER — Other Ambulatory Visit: Payer: Self-pay | Admitting: Internal Medicine

## 2020-03-20 ENCOUNTER — Other Ambulatory Visit: Payer: 59

## 2020-03-25 NOTE — Telephone Encounter (Signed)
Referral has been authorized.  Pt contacted me about scheduling an appt.  Can you help with this?  I am sending to both of you.

## 2020-03-27 ENCOUNTER — Other Ambulatory Visit: Payer: 59

## 2020-03-31 NOTE — Telephone Encounter (Signed)
I am not sure what we need to do to get this scheduled.  Can one of you help with this and contact pt regarding what he needs to do.  Thanks.

## 2020-04-09 ENCOUNTER — Encounter: Payer: Self-pay | Admitting: Adult Health

## 2020-04-09 ENCOUNTER — Ambulatory Visit (INDEPENDENT_AMBULATORY_CARE_PROVIDER_SITE_OTHER): Payer: 59 | Admitting: Adult Health

## 2020-04-09 ENCOUNTER — Other Ambulatory Visit: Payer: Self-pay

## 2020-04-09 VITALS — BP 120/70 | HR 91 | Temp 97.3°F | Ht 67.0 in | Wt 186.0 lb

## 2020-04-09 DIAGNOSIS — G4733 Obstructive sleep apnea (adult) (pediatric): Secondary | ICD-10-CM | POA: Diagnosis not present

## 2020-04-09 DIAGNOSIS — Z23 Encounter for immunization: Secondary | ICD-10-CM

## 2020-04-09 NOTE — Progress Notes (Signed)
@Patient  ID: Eric Hayden, male    DOB: 09-15-1975, 44 y.o.   MRN: [id number]  Chief Complaint  Patient presents with  . Consult    OSA     Referring provider: Einar Pheasant, MD  HPI: 44 year old male never smoker seen for sleep consult April 09, 2020 to establish for sleep apnea Medical hx of T1DM , Hyperlipidemia. Migraines.   TEST/EVENTS :    04/09/2020 Sleep Consult  Patient presents for an initial sleep consult to establish for obstructive sleep apnea.  Patient says he has had sleep apnea for the last 15 years and has been on nocturnal CPAP.  Says his CPAP machine is getting old and he needs a new machine.  Patient says he typically goes to bed around 11 PM falls asleep within 30 minutes and gets up 2-3 times at night and fell he struck his day around 6:54 AM.  Patient says his initial sleep study was about 15 years ago.  He has been wearing his CPAP ever since.  He says he wears his CPAP every single night and gets in about 6 to 7 hours.  Patient feels rested with his CPAP.  However his machine has not been working very well lately and needs a new machine.  Epworth score is 8.  Patient says with his sleep apnea he had loud snoring restless sleep daytime sleepiness.  Denies any known sleep paralysis or cataplexy symptoms.. Says he wears CPAP each night and never misses any nights. Has little caffeine.  OSA was previously managed by local  ENT in past. PCP referred to our  Office . Original sleep study done 11/05/2008 at sleep Med showed moderate OSA with AHI 16.6/hr . Spo2 low at 82%. CPAP titration showed good control with CPAP 14cm H2O.  Feels CPAP really helps him a lot . Does not go without it.  Wears a full nasal mask  This seems to be the most comfortable .  Unsure of CPAP settings.   Patient is a Theme park manager at [employer].  Allergies  Allergen Reactions  . Augmentin [Amoxicillin-Pot Clavulanate] Nausea And Vomiting  . Levaquin [Levofloxacin In D5w] Swelling     Blurred vision    Immunization History  Administered Date(s) Administered  . H1N1 07/01/2008  . Influenza Split 05/09/2014  . Influenza, Seasonal, Injecte, Preservative Fre 07/12/2005, 05/26/2012  . Influenza,inj,Quad PF,6+ Mos 05/16/2017  . Influenza-Unspecified 09/15/2015, 05/17/2018, 05/17/2019  . Pneumococcal Polysaccharide-23 09/23/2009    Past Medical History:  Diagnosis Date  . Asthma   . Cancer (HCC)    appendiceal  . Chicken pox   . GERD (gastroesophageal reflux disease)   . Heart murmur   . Hx of oral aphthous ulcers   . Hypothyroidism   . Migraines   . Sleep apnea   . Type 1 diabetes mellitus (HCC)     Tobacco History: Social History   Tobacco Use  Smoking Status Never Smoker  Smokeless Tobacco Never Used   Counseling given: Not Answered   Outpatient Medications Prior to Visit  Medication Sig Dispense Refill  . amitriptyline (ELAVIL) 25 MG tablet TAKE ONE TABLET AT BEDTIME 90 tablet 1  . atorvastatin (LIPITOR) 10 MG tablet TAKE ONE TABLET BY MOUTH EVERY DAY 30 tablet 6  . CONTOUR NEXT TEST test strip   1  . gabapentin (NEURONTIN) 300 MG capsule Start 331m tablet at bedtime. Titrate as needed to max 2 tabs twice daily.    . Galcanezumab-gnlm (EMGALITY) 120 MG/ML SOAJ     .  insulin lispro (HUMALOG) 100 UNIT/ML injection As directed.  Dispense 4 vials. 10 mL 5  . levothyroxine (SYNTHROID, LEVOTHROID) 137 MCG tablet   1  . NOVOLOG 100 UNIT/ML injection   11  . omeprazole (PRILOSEC) 20 MG capsule TAKE 1 CAPSULE BY MOUTH EVERY DAY 90 capsule 0  . ranitidine (ZANTAC) 150 MG tablet Take 150 mg by mouth at bedtime.    . topiramate (TOPAMAX) 25 MG tablet TAKE 1-2 TABLETS BY MOUTH PER DAY 60 tablet 1  . carbamide peroxide (DEBROX) 6.5 % otic solution 3-4 drops in both ears q day.  Massage approximately 5 minutes 15 mL 0  . Fremanezumab-vfrm (AJOVY) 225 MG/1.5ML SOAJ Inject 225 mg into the skin every 30 (thirty) days. 1 pen 6   No facility-administered  medications prior to visit.     Review of Systems:   Constitutional:   No  weight loss, night sweats,  Fevers, chills, fatigue, or  lassitude.  HEENT:   No headaches,  Difficulty swallowing,  Tooth/dental problems, or  Sore throat,                No sneezing, itching, ear ache, nasal congestion, post nasal drip,   CV:  No chest pain,  Orthopnea, PND, swelling in lower extremities, anasarca, dizziness, palpitations, syncope.   GI  No heartburn, indigestion, abdominal pain, nausea, vomiting, diarrhea, change in bowel habits, loss of appetite, bloody stools.   Resp: No shortness of breath with exertion or at rest.  No excess mucus, no productive cough,  No non-productive cough,  No coughing up of blood.  No change in color of mucus.  No wheezing.  No chest wall deformity  Skin: no rash or lesions.  GU: no dysuria, change in color of urine, no urgency or frequency.  No flank pain, no hematuria   MS:  No joint pain or swelling.  No decreased range of motion.  No back pain.    Physical Exam  BP 120/70 (BP Location: Left Arm, Patient Position: Sitting, Cuff Size: Normal)   Pulse 91   Temp (!) 97.3 F (36.3 C) (Temporal)   Ht 5' 7"  (1.702 m)   Wt 186 lb (84.4 kg)   SpO2 99%   BMI 29.13 kg/m   GEN: A/Ox3; pleasant , NAD, well nourished    HEENT:  [place]/AT,  EACs-clear, TMs-wnl, NOSE-clear, THROAT-clear, no lesions, no postnasal drip or exudate noted.   NECK:  Supple w/ fair ROM; no JVD; normal carotid impulses w/o bruits; no thyromegaly or nodules palpated; no lymphadenopathy.    RESP  Clear  P & A; w/o, wheezes/ rales/ or rhonchi. no accessory muscle use, no dullness to percussion  CARD:  RRR, no m/r/g, no peripheral edema, pulses intact, no cyanosis or clubbing.  GI:   Soft & nt; nml bowel sounds; no organomegaly or masses detected.   Musco: Warm bil, no deformities or joint swelling noted.   Neuro: alert, no focal deficits noted.    Skin: Warm, no lesions or  rashes    Lab Results:  CBC    Component Value Date/Time   WBC 9.4 09/05/2019 1359   RBC 5.41 09/05/2019 1359   HGB 15.4 09/05/2019 1359   HGB 14.2 05/18/2014 1736   HCT 46.7 09/05/2019 1359   HCT 44.5 05/18/2014 1736   PLT 299.0 09/05/2019 1359   PLT 248 05/18/2014 1736   MCV 86.4 09/05/2019 1359   MCV 83 05/18/2014 1736   MCH 26.7 05/18/2014 1736   MCHC 33.0  09/05/2019 1359   RDW 13.0 09/05/2019 1359   RDW 14.1 05/18/2014 1736   LYMPHSABS 2.8 09/05/2019 1359   LYMPHSABS 1.4 05/18/2014 1736   MONOABS 0.5 09/05/2019 1359   MONOABS 0.7 05/18/2014 1736   EOSABS 0.5 09/05/2019 1359   EOSABS 0.1 05/18/2014 1736   BASOSABS 0.1 09/05/2019 1359   BASOSABS 0.1 05/18/2014 1736    BMET    Component Value Date/Time   NA 138 09/05/2019 1359   NA 139 05/18/2014 1736   K 4.8 09/05/2019 1359   K 4.5 05/18/2014 1736   CL 102 09/05/2019 1359   CL 106 05/18/2014 1736   CO2 32 09/05/2019 1359   CO2 26 05/18/2014 1736   GLUCOSE 121 (H) 09/05/2019 1359   GLUCOSE 153 (H) 05/18/2014 1736   BUN 12 09/05/2019 1359   BUN 12 05/18/2014 1736   CREATININE 1.00 09/05/2019 1359   CREATININE 1.10 05/18/2014 1736   CALCIUM 9.7 09/05/2019 1359   CALCIUM 8.6 05/18/2014 1736   GFRNONAA >60 05/18/2014 1736   GFRAA >60 05/18/2014 1736    BNP No results found for: BNP  ProBNP No results found for: PROBNP  Imaging: No results found.    No flowsheet data found.  No results found for: NITRICOXIDE      Assessment & Plan:   No problem-specific Assessment & Plan notes found for this encounter.     Rexene Edison, NP 04/09/2020

## 2020-04-09 NOTE — Patient Instructions (Addendum)
Order for new CPAP machine Will try to get copy of your previous sleep study  Wear each night.  Work on healthy weight .  Do not drive if sleepy  Follow up with  Dr. Mortimer Fries in 3 months and As needed   Flu shot today .

## 2020-04-09 NOTE — Assessment & Plan Note (Signed)
Moderate OSA with reported excellent compliance and perceived benefit on CPAP .  Machine is getting old , needs new machine, unable to get download.  Order for new machine   Plan  Patient Instructions  Order for new CPAP machine Will try to get copy of your previous sleep study  Wear each night.  Work on healthy weight .  Do not drive if sleepy  Follow up with  Dr. Mortimer Fries in 3 months and As needed   Flu shot today .

## 2020-04-10 ENCOUNTER — Other Ambulatory Visit: Payer: 59

## 2020-04-21 ENCOUNTER — Ambulatory Visit
Admission: EM | Admit: 2020-04-21 | Discharge: 2020-04-21 | Disposition: A | Discharge status: Home / Self Care | Payer: 59 | Attending: Emergency Medicine | Admitting: Emergency Medicine

## 2020-04-21 ENCOUNTER — Encounter: Payer: Self-pay | Admitting: Emergency Medicine

## 2020-04-21 ENCOUNTER — Other Ambulatory Visit: Payer: Self-pay

## 2020-04-21 DIAGNOSIS — J029 Acute pharyngitis, unspecified: Secondary | ICD-10-CM | POA: Diagnosis not present

## 2020-04-21 DIAGNOSIS — J069 Acute upper respiratory infection, unspecified: Secondary | ICD-10-CM | POA: Insufficient documentation

## 2020-04-21 MED ORDER — BENZONATATE 100 MG PO CAPS
100.0000 mg | ORAL_CAPSULE | Freq: Three times a day (TID) | ORAL | 0 refills | Status: DC | PRN
Start: 2020-04-21 — End: 2021-07-07

## 2020-04-21 NOTE — ED Triage Notes (Addendum)
Patient in office today c/o Cough,sorethroat, nasal drainage since Thursday 4days ago.   OTC: cepacol Denies:fever,n/v,

## 2020-04-21 NOTE — Discharge Instructions (Addendum)
Your rapid strep test is negative.  A throat culture is pending; we will call you if it is positive requiring treatment.    Your COVID test is pending.  You should self quarantine until the test result is back.    Take the [facility] as needed for cough.  Take Tylenol or ibuprofen as needed for fever or discomfort.  Rest and keep yourself hydrated.    Go to the emergency department if you develop acute worsening symptoms.

## 2020-04-21 NOTE — ED Provider Notes (Signed)
Eric Hayden    CSN: [id number] Arrival date & time: 04/21/20  0841      History   Chief Complaint Chief Complaint  Patient presents with  . Nasal Congestion  . Sore Throat  . Cough    HPI Eric Hayden is a 44 y.o. male.   Patient presents with 4-day history of sore throat, nonproductive cough, postnasal drip, sinus congestion, runny nose.  He denies fever, chills, shortness of breath, vomiting, diarrhea, or other symptoms.  Treatment attempted at home with Cepacol.  Patient is fully vaccinated.    The history is provided by the patient.    Past Medical History:  Diagnosis Date  . Asthma   . Cancer (HCC)    appendiceal  . Chicken pox   . GERD (gastroesophageal reflux disease)   . Heart murmur   . Hx of oral aphthous ulcers   . Hypothyroidism   . Migraines   . Sleep apnea   . Type 1 diabetes mellitus [facility])     Patient Active Problem List   Diagnosis Date Noted  . Altered level of consciousness 05/22/2017  . Syncope 05/17/2017  . Cough 05/07/2016  . Dizziness 04/12/2016  . Elevated alkaline phosphatase level 08/17/2015  . Health care maintenance 12/09/2014  . Chest pain 12/09/2014  . Headache 05/20/2014  . Abdominal pain 05/20/2014  . Back pain 08/19/2013  . Type 1 diabetes mellitus ([place]) 11/18/2012  . Hypercholesterolemia 11/18/2012  . Neuropathy 11/18/2012  . Obstructive sleep apnea 11/18/2012  . GERD (gastroesophageal reflux disease) 11/18/2012  . Hypothyroidism 11/18/2012    Past Surgical History:  Procedure Laterality Date  . APPENDECTOMY  2012  . COLONOSCOPY WITH PROPOFOL N/A 10/15/2019   Procedure: COLONOSCOPY WITH PROPOFOL;  Surgeon: Jonathon Bellows, MD;  Location: [facility] ENDOSCOPY;  Service: Gastroenterology;  Laterality: N/A;  . ESOPHAGOGASTRODUODENOSCOPY (EGD) WITH PROPOFOL N/A 10/15/2019   Procedure: ESOPHAGOGASTRODUODENOSCOPY (EGD) WITH PROPOFOL;  Surgeon: Jonathon Bellows, MD;  Location: [facility] ENDOSCOPY;  Service: Gastroenterology;   Laterality: N/A;  . TONSILLECTOMY AND ADENOIDECTOMY  2007  . UPPER GI ENDOSCOPY         Home Medications    Prior to Admission medications   Medication Sig Start Date End Date Taking? Authorizing Provider  amitriptyline (ELAVIL) 25 MG tablet TAKE ONE TABLET AT BEDTIME 07/17/18   Einar Pheasant, MD  atorvastatin (LIPITOR) 10 MG tablet TAKE ONE TABLET BY MOUTH EVERY DAY 07/05/14   Einar Pheasant, MD  benzonatate (TESSALON) 100 MG capsule Take 1 capsule (100 mg total) by mouth 3 (three) times daily as needed for cough. 04/21/20   Sharion Balloon, NP  carbamide peroxide (DEBROX) 6.5 % otic solution 3-4 drops in both ears q day.  Massage approximately 5 minutes 04/12/16   Einar Pheasant, MD  CONTOUR NEXT TEST test strip  12/12/17   [provider]  Fremanezumab-vfrm (AJOVY) 225 MG/1.5ML SOAJ Inject 225 mg into the skin every 30 (thirty) days. 09/03/19   Einar Pheasant, MD  gabapentin (NEURONTIN) 300 MG capsule Start 347m tablet at bedtime. Titrate as needed to max 2 tabs twice daily. 08/20/16   [provider]  Galcanezumab-gnlm ([facility] 120 MG/ML SOAJ  03/13/20   [provider]  insulin lispro (HUMALOG) 100 UNIT/ML injection As directed.  Dispense 4 vials. 08/23/13   SEinar Pheasant MD  levothyroxine (SYNTHROID, LEVOTHROID) 137 MCG tablet  09/22/17   [provider]  NOVOLOG 100 UNIT/ML injection  12/05/17   [provider]  omeprazole (PRILOSEC) 20 MG capsule  TAKE 1 CAPSULE BY MOUTH EVERY DAY 07/24/18   Einar Pheasant, MD  ranitidine (ZANTAC) 150 MG tablet Take 150 mg by mouth at bedtime.    [provider]  topiramate (TOPAMAX) 25 MG tablet TAKE 1-2 TABLETS BY MOUTH PER DAY 03/17/20   Einar Pheasant, MD    Family History Family History  Problem Relation Age of Onset  . Arthritis Maternal Grandmother   . Hyperlipidemia Maternal Grandmother   . Heart disease Maternal Grandmother   . Hypertension Maternal Grandmother   . Diabetes  Maternal Grandmother        type 2  . Heart attack Maternal Grandmother   . Arthritis Maternal Grandfather   . Heart disease Maternal Grandfather   . Hypertension Maternal Grandfather   . Heart attack Maternal Grandfather   . Arthritis Mother   . Hyperlipidemia Mother   . Hypertension Mother   . Diabetes Mother        type 2  . Hyperlipidemia Father   . Arthritis Paternal Grandmother   . Arthritis Paternal Grandfather   . Heart attack Paternal Aunt     Social History Social History   Tobacco Use  . Smoking status: Never Smoker  . Smokeless tobacco: Never Used  Vaping Use  . Vaping Use: Never used  Substance Use Topics  . Alcohol use: No    Alcohol/week: 0.0 standard drinks  . Drug use: No     Allergies   Augmentin [amoxicillin-pot clavulanate] and Levaquin [levofloxacin in d5w]   Review of Systems Review of Systems  Constitutional: Negative for chills and fever.  HENT: Positive for congestion, postnasal drip, rhinorrhea and sore throat. Negative for ear pain.   Eyes: Negative for pain and visual disturbance.  Respiratory: Positive for cough. Negative for shortness of breath.   Cardiovascular: Negative for chest pain and palpitations.  Gastrointestinal: Negative for abdominal pain, diarrhea and vomiting.  Genitourinary: Negative for dysuria and hematuria.  Musculoskeletal: Negative for arthralgias and back pain.  Skin: Negative for color change and rash.  Neurological: Negative for seizures and syncope.  All other systems reviewed and are negative.    Physical Exam Triage Vital Signs ED Triage Vitals  Enc Vitals Group     BP 04/21/20 0849 125/85     Pulse Rate 04/21/20 0849 82     Resp 04/21/20 0849 16     Temp 04/21/20 0849 97.9 F (36.6 C)     Temp Source 04/21/20 0849 Oral     SpO2 04/21/20 0849 97 %     Weight 04/21/20 0846 185 lb (83.9 kg)     Height --      Head Circumference --      Peak Flow --      Pain Score 04/21/20 0845 6     Pain Loc --       Pain Edu? --      Excl. in [place]? --    No data found.  Updated Vital Signs BP 125/85   Pulse 82   Temp 97.9 F (36.6 C) (Oral)   Resp 16   Wt 185 lb (83.9 kg)   SpO2 97%   BMI 28.98 kg/m   Visual Acuity Right Eye Distance:   Left Eye Distance:   Bilateral Distance:    Right Eye Near:   Left Eye Near:    Bilateral Near:     Physical Exam Vitals and nursing note reviewed.  Constitutional:      General: He is not in acute distress.  Appearance: He is well-developed.  HENT:     Head: Normocephalic and atraumatic.     Right Ear: Tympanic membrane normal.     Left Ear: Tympanic membrane normal.     Nose: Nose normal.     Mouth/Throat:     Mouth: Mucous membranes are moist.     Pharynx: Posterior oropharyngeal erythema present. No oropharyngeal exudate.  Eyes:     Conjunctiva/sclera: Conjunctivae normal.  Cardiovascular:     Rate and Rhythm: Normal rate and regular rhythm.     Heart sounds: No murmur heard.   Pulmonary:     Effort: Pulmonary effort is normal. No respiratory distress.     Breath sounds: Normal breath sounds.  Abdominal:     Palpations: Abdomen is soft.     Tenderness: There is no abdominal tenderness. There is no guarding or rebound.  Musculoskeletal:     Cervical back: Neck supple.  Skin:    General: Skin is warm and dry.     Findings: No rash.  Neurological:     General: No focal deficit present.     Mental Status: He is alert and oriented to person, place, and time.     Gait: Gait normal.  Psychiatric:        Mood and Affect: Mood normal.        Behavior: Behavior normal.      UC Treatments / Results  Labs (all labs ordered are listed, but only abnormal results are displayed) Labs Reviewed  NOVEL CORONAVIRUS, NAA  CULTURE, GROUP A STREP [facility])  POCT RAPID STREP A (OFFICE)    EKG   Radiology No results found.  Procedures Procedures (including critical care time)  Medications Ordered in UC Medications - No data to  display  Initial Impression / Assessment and Plan / UC Course  I have reviewed the triage vital signs and the nursing notes.  Pertinent labs & imaging results that were available during my care of the patient were reviewed by me and considered in my medical decision making (see chart for details).   Viral URI with cough.  Rapid strep negative; culture pending.  PCR COVID pending.  Instructed patient to self quarantine until the test result is back.  Discussed symptomatic treatment including Tessalon Perles, Tylenol or ibuprofen, rest, hydration.  Instructed patient to go to the ED if he has acute worsening symptoms.  Patient agrees to plan of care.    Final Clinical Impressions(s) / UC Diagnoses   Final diagnoses:  Viral URI with cough     Discharge Instructions     Your rapid strep test is negative.  A throat culture is pending; we will call you if it is positive requiring treatment.    Your COVID test is pending.  You should self quarantine until the test result is back.    Take the [facility] as needed for cough.  Take Tylenol or ibuprofen as needed for fever or discomfort.  Rest and keep yourself hydrated.    Go to the emergency department if you develop acute worsening symptoms.        ED Prescriptions    Medication Sig Dispense Auth. Provider   benzonatate (TESSALON) 100 MG capsule Take 1 capsule (100 mg total) by mouth 3 (three) times daily as needed for cough. 21 capsule Sharion Balloon, NP     PDMP not reviewed this encounter.   Sharion Balloon, NP 04/21/20 [phone]

## 2020-04-23 ENCOUNTER — Encounter: Payer: Self-pay | Admitting: Internal Medicine

## 2020-04-23 LAB — CULTURE, GROUP A STREP (THRC)

## 2020-04-23 LAB — SARS-COV-2, NAA 2 DAY TAT

## 2020-04-23 LAB — NOVEL CORONAVIRUS, NAA: SARS-CoV-2, NAA: NOT DETECTED

## 2020-04-25 ENCOUNTER — Encounter: Payer: Self-pay | Admitting: Internal Medicine

## 2020-04-25 ENCOUNTER — Telehealth (INDEPENDENT_AMBULATORY_CARE_PROVIDER_SITE_OTHER): Payer: 59 | Admitting: Internal Medicine

## 2020-04-25 ENCOUNTER — Other Ambulatory Visit: Payer: Self-pay

## 2020-04-25 ENCOUNTER — Telehealth: Payer: Self-pay | Admitting: Internal Medicine

## 2020-04-25 DIAGNOSIS — R05 Cough: Secondary | ICD-10-CM | POA: Diagnosis not present

## 2020-04-25 DIAGNOSIS — R059 Cough, unspecified: Secondary | ICD-10-CM

## 2020-04-25 MED ORDER — AZITHROMYCIN 250 MG PO TABS: ORAL_TABLET | ORAL | 0 refills | Status: DC

## 2020-04-25 MED ORDER — ALBUTEROL SULFATE HFA 108 (90 BASE) MCG/ACT IN AERS: 2.0000 | INHALATION_SPRAY | Freq: Four times a day (QID) | RESPIRATORY_TRACT | 0 refills | Status: DC | PRN

## 2020-04-25 NOTE — Progress Notes (Signed)
Patient ID: Eric Hayden, male   DOB: [birth year], 44 y.o.   MRN: [id number]   Virtual Visit via video Note  This visit type was conducted due to national recommendations for restrictions regarding the COVID-19 pandemic (e.g. social distancing).  This format is felt to be most appropriate for this patient at this time.  All issues noted in this document were discussed and addressed.  No physical exam was performed (except for noted visual exam findings with Video Visits).   I connected with Eric Hayden by a video enabled telemedicine application or telephone and verified that I am speaking with the correct person using two identifiers. Location patient: home Location provider: work  Persons participating in the virtual visit: patient, provider  The limitations, risks, security and privacy concerns of performing an evaluation and management service by video and the availability of in person appointments have been discussed.   It has also been discussed with the patient that there may be a patient responsible charge related to this service. The patient expressed understanding and agreed to proceed.   Reason for visit: work in appt.   HPI: Work in appt with increased nasal and chest congestion.  Evaluated at [facility] 04/21/20.  Sore throat, cough and congestion.  Diagnosed with viral URI.  Taking mucinex.  Increased congestion in am and night - lying down.  Productive colored mucus.  Sore throat is better.  Increased drainage.  No earache. No headache.  No fever.  No chest pain or sob.  Still with cough.  covid test negative .    ROS: See pertinent positives and negatives per HPI.  Past Medical History:  Diagnosis Date  . Asthma   . Cancer (HCC)    appendiceal  . Chicken pox   . GERD (gastroesophageal reflux disease)   . Heart murmur   . Hx of oral aphthous ulcers   . Hypothyroidism   . Migraines   . Sleep apnea   . Type 1 diabetes mellitus ([place])     Past Surgical History:  Procedure  Laterality Date  . APPENDECTOMY  2012  . COLONOSCOPY WITH PROPOFOL N/A 10/15/2019   Procedure: COLONOSCOPY WITH PROPOFOL;  Surgeon: Jonathon Bellows, MD;  Location: [facility] ENDOSCOPY;  Service: Gastroenterology;  Laterality: N/A;  . ESOPHAGOGASTRODUODENOSCOPY (EGD) WITH PROPOFOL N/A 10/15/2019   Procedure: ESOPHAGOGASTRODUODENOSCOPY (EGD) WITH PROPOFOL;  Surgeon: Jonathon Bellows, MD;  Location: [facility] ENDOSCOPY;  Service: Gastroenterology;  Laterality: N/A;  . TONSILLECTOMY AND ADENOIDECTOMY  2007  . UPPER GI ENDOSCOPY      Family History  Problem Relation Age of Onset  . Arthritis Maternal Grandmother   . Hyperlipidemia Maternal Grandmother   . Heart disease Maternal Grandmother   . Hypertension Maternal Grandmother   . Diabetes Maternal Grandmother        type 2  . Heart attack Maternal Grandmother   . Arthritis Maternal Grandfather   . Heart disease Maternal Grandfather   . Hypertension Maternal Grandfather   . Heart attack Maternal Grandfather   . Arthritis Mother   . Hyperlipidemia Mother   . Hypertension Mother   . Diabetes Mother        type 2  . Hyperlipidemia Father   . Arthritis Paternal Grandmother   . Arthritis Paternal Grandfather   . Heart attack Paternal Aunt     SOCIAL HX: reviewed.    Current Outpatient Medications:  .  amitriptyline (ELAVIL) 25 MG tablet, TAKE ONE TABLET AT BEDTIME, Disp: 90 tablet, Rfl: 1 .  atorvastatin (LIPITOR)  10 MG tablet, TAKE ONE TABLET BY MOUTH EVERY DAY, Disp: 30 tablet, Rfl: 6 .  benzonatate (TESSALON) 100 MG capsule, Take 1 capsule (100 mg total) by mouth 3 (three) times daily as needed for cough., Disp: 21 capsule, Rfl: 0 .  Blood Glucose Monitoring Suppl ([place]) w/Device KIT, See admin instructions., Disp: , Rfl:  .  CONTOUR NEXT TEST test strip, , Disp: , Rfl: 1 .  folic acid (FOLVITE) 301 MCG tablet, Take by mouth., Disp: , Rfl:  .  gabapentin (NEURONTIN) 300 MG capsule, Start 324m tablet at bedtime. Titrate as  needed to max 2 tabs twice daily., Disp: , Rfl:  .  Galcanezumab-gnlm (EMGALITY) 120 MG/ML SOAJ, , Disp: , Rfl:  .  insulin lispro (HUMALOG) 100 UNIT/ML injection, As directed.  Dispense 4 vials., Disp: 10 mL, Rfl: 5 .  ketorolac (TORADOL) 10 MG tablet, Take 10 mg by mouth once as needed., Disp: , Rfl:  .  levothyroxine (SYNTHROID) 125 MCG tablet, Take 125 mcg by mouth daily., Disp: , Rfl:  .  naratriptan (AMERGE) 2.5 MG tablet, Take 2.5 mg by mouth 2 (two) times daily as needed., Disp: , Rfl:  .  NOVOLOG 100 UNIT/ML injection, , Disp: , Rfl: 11 .  omeprazole (PRILOSEC) 20 MG capsule, TAKE 1 CAPSULE BY MOUTH EVERY DAY, Disp: 90 capsule, Rfl: 0 .  promethazine (PHENERGAN) 12.5 MG tablet, Take 12.5 mg by mouth every 6 (six) hours as needed., Disp: , Rfl:  .  ranitidine (ZANTAC) 150 MG tablet, Take 150 mg by mouth at bedtime., Disp: , Rfl:  .  Rimegepant Sulfate (NURTEC) 75 MG TBDP, Take by mouth., Disp: , Rfl:  .  topiramate (TOPAMAX) 25 MG tablet, TAKE 1-2 TABLETS BY MOUTH PER DAY, Disp: 60 tablet, Rfl: 1 .  albuterol (VENTOLIN HFA) 108 (90 Base) MCG/ACT inhaler, Inhale 2 puffs into the lungs every 6 (six) hours as needed for wheezing or shortness of breath., Disp: 18 g, Rfl: 0 .  azithromycin (ZITHROMAX) 250 MG tablet, Take 2 tablets x 1 day and then 1 tablet per day for four more days., Disp: 6 tablet, Rfl: 0  EXAM:  GENERAL: alert, oriented, appears well and in no acute distress  HEENT: atraumatic, conjunttiva clear, no obvious abnormalities on inspection of external nose and ears  NECK: normal movements of the head and neck  LUNGS: on inspection no signs of respiratory distress, breathing rate appears normal, no obvious gross SOB, gasping or wheezing  CV: no obvious cyanosis  PSYCH/NEURO: pleasant and cooperative, no obvious depression or anxiety, speech and thought processing grossly intact  ASSESSMENT AND PLAN:  Discussed the following assessment and plan:  Cough Increased  cough and congestion as outlined.  [facility]  Given tessalon perles.  Persistent symptoms.  Sore throat better.  No fever.  No sob.  No chest pain.  Cough productive of colored mucus.  Concern over bacterial infection.  Continue mucinex.  Treat with zpak as directed.  Steroid nasal spray and saline nasal spray as directed.  Follow.  Call with update.     Meds ordered this encounter  Medications  . azithromycin (ZITHROMAX) 250 MG tablet    Sig: Take 2 tablets x 1 day and then 1 tablet per day for four more days.    Dispense:  6 tablet    Refill:  0  . albuterol (VENTOLIN HFA) 108 (90 Base) MCG/ACT inhaler    Sig: Inhale 2  puffs into the lungs every 6 (six) hours as needed for wheezing or shortness of breath.    Dispense:  18 g    Refill:  0     I discussed the assessment and treatment plan with the patient. The patient was provided an opportunity to ask questions and all were answered. The patient agreed with the plan and demonstrated an understanding of the instructions.   The patient was advised to call back or seek an in-person evaluation if the symptoms worsen or if the condition fails to improve as anticipated.   Einar Pheasant, MD

## 2020-04-25 NOTE — Telephone Encounter (Signed)
My chart message sent to pt for update.

## 2020-05-04 ENCOUNTER — Encounter: Payer: Self-pay | Admitting: Internal Medicine

## 2020-05-04 NOTE — Assessment & Plan Note (Signed)
Increased cough and congestion as outlined.  Recently evaluated at [facility] and diagnosed with viral URI.  Given tessalon perles.  Persistent symptoms.  Sore throat better.  No fever.  No sob.  No chest pain.  Cough productive of colored mucus.  Concern over bacterial infection.  Continue mucinex.  Treat with zpak as directed.  Steroid nasal spray and saline nasal spray as directed.  Follow.  Call with update.

## 2020-05-18 ENCOUNTER — Other Ambulatory Visit: Payer: Self-pay | Admitting: Internal Medicine

## 2020-05-21 ENCOUNTER — Encounter: Payer: Self-pay | Admitting: *Deleted

## 2020-07-11 ENCOUNTER — Other Ambulatory Visit: Payer: Self-pay | Admitting: Internal Medicine

## 2020-08-15 ENCOUNTER — Other Ambulatory Visit: Payer: Self-pay | Admitting: Internal Medicine

## 2020-08-26 NOTE — Telephone Encounter (Signed)
Anita, please advise. Thanks 

## 2020-08-27 NOTE — Telephone Encounter (Signed)
I tried calling [facility] on 1/25 and got transferred to 3 different places and never could speak with anyone. When I came in this morning I sent message to [employer] to see if they know anything about his CPAP being denied still waiting

## 2020-09-01 NOTE — Telephone Encounter (Signed)
Patient is requesting update.   Eric Hayden, please advise. Thanks

## 2020-09-01 NOTE — Telephone Encounter (Signed)
I finally got to speak with someone with [facility] and they told me that we needed to fax the records Attn: Reconsideration . I have faxed our office note and the sleep studies that we have to [phone]

## 2020-10-12 ENCOUNTER — Other Ambulatory Visit: Payer: Self-pay | Admitting: Internal Medicine

## 2021-01-07 ENCOUNTER — Other Ambulatory Visit: Payer: Self-pay | Admitting: Internal Medicine

## 2021-01-12 ENCOUNTER — Other Ambulatory Visit: Payer: Self-pay | Admitting: Internal Medicine

## 2021-02-02 ENCOUNTER — Telehealth: Payer: Self-pay | Admitting: Internal Medicine

## 2021-02-02 MED ORDER — MOLNUPIRAVIR EUA 200MG CAPSULE: 4.0000 | ORAL_CAPSULE | Freq: Two times a day (BID) | ORAL | 0 refills | Status: AC

## 2021-02-02 NOTE — Telephone Encounter (Signed)
Lannie called to report positive covid. Multiple people at church with covid.  Coworker tested positive end of last week.  He reports noticing scratchy throat Friday 01/30/21.  Symptoms remained the same for the next couple of days. Symptoms progressed yesterday evening.  Increased head congestion.  Dry throat.  Minimal cough.  No chest pain, chest tightness or sob.  Some gas and abdominal issues yesterday, none today.  Some diarrhea yesterday.  No vomiting.  Decreased appetite.  Eating.  Blood sugar >200 this am.  Took mucinex last night.  With history of diabetes, discussed oral anti viral medication.  He is agreeable.  Discussed molnupiravir.  Discussed possible side effects.  He agrees.  Rx sent in.  Instructed to [place] nasacort nasal spray and saline as directed.  Mucinex as directed.  Follow.  Call with update.

## 2021-02-03 ENCOUNTER — Telehealth: Payer: Self-pay

## 2021-02-03 NOTE — Telephone Encounter (Signed)
Providing Access Nurse documentation

## 2021-02-03 NOTE — Telephone Encounter (Signed)
See Telephone note on 02/02/21

## 2021-02-09 ENCOUNTER — Other Ambulatory Visit: Payer: Self-pay | Admitting: Internal Medicine

## 2021-02-24 ENCOUNTER — Telehealth (INDEPENDENT_AMBULATORY_CARE_PROVIDER_SITE_OTHER): Payer: 59 | Admitting: Internal Medicine

## 2021-02-24 DIAGNOSIS — Z8616 Personal history of COVID-19: Secondary | ICD-10-CM

## 2021-02-24 DIAGNOSIS — R748 Abnormal levels of other serum enzymes: Secondary | ICD-10-CM

## 2021-02-24 DIAGNOSIS — K219 Gastro-esophageal reflux disease without esophagitis: Secondary | ICD-10-CM | POA: Diagnosis not present

## 2021-02-24 DIAGNOSIS — G4733 Obstructive sleep apnea (adult) (pediatric): Secondary | ICD-10-CM

## 2021-02-24 DIAGNOSIS — R519 Headache, unspecified: Secondary | ICD-10-CM | POA: Diagnosis not present

## 2021-02-24 DIAGNOSIS — G629 Polyneuropathy, unspecified: Secondary | ICD-10-CM

## 2021-02-24 DIAGNOSIS — E039 Hypothyroidism, unspecified: Secondary | ICD-10-CM

## 2021-02-24 DIAGNOSIS — E7800 Pure hypercholesterolemia, unspecified: Secondary | ICD-10-CM

## 2021-02-24 DIAGNOSIS — E1040 Type 1 diabetes mellitus with diabetic neuropathy, unspecified: Secondary | ICD-10-CM

## 2021-02-24 NOTE — Progress Notes (Signed)
Patient ID: Eric Hayden, male   DOB: [birth year], 45 y.o.   MRN: [id number]   Virtual Visit via video Note  This visit type was conducted due to national recommendations for restrictions regarding the COVID-19 pandemic (e.g. social distancing).  This format is felt to be most appropriate for this patient at this time.  All issues noted in this document were discussed and addressed.  No physical exam was performed (except for noted visual exam findings with Video Visits).   I connected with Eric Hayden by a video enabled telemedicine application and verified that I am speaking with the correct person using two identifiers. Location patient: home Location provider: work  Persons participating in the virtual visit: patient, provider and pts wife - Anderson Malta  The limitations, risks, security and privacy concerns of performing an evaluation and management service by video and the availability of in person appointments have been discussed.  It has also been discussed with the patient that there may be a patient responsible charge related to this service. The patient expressed understanding and agreed to proceed.   Reason for visit: follow up appt  HPI: Follow up regarding his blood pressure, cholesterol and blood sugar.  Recently had covid 02/02/21.  Received molnupiravir.  Is better - regarding covid.  No residual cough or congestion.  He is having persistent problems with headaches.  Has been seeing neurology.  Diagnosed with vestibular migraines.  Has tried various medications (including emgality, etc).  Recently started on aimovig. States nothing has really seemed to make a big difference.  Has daily headaches and reports the bad migraines are occurring 3-4x/week.  Will notice dizziness and then headache.  Has nurtec, topamax and etodolac. No vomiting.  Is able to eat.  Monitoring sugars - using CGM - has sensor.  No significant problems with low sugars.  No chest pain.  Breathing stable.  No increased  cough or congestion.  No abdominal pain.  No bowel change reported.  Some fatigue.  Has f/u with endocrinology end of next month.    ROS: See pertinent positives and negatives per HPI.  Past Medical History:  Diagnosis Date   Asthma    Cancer ([place])    appendiceal   Chicken pox    GERD (gastroesophageal reflux disease)    Heart murmur    Hx of oral aphthous ulcers    Hypothyroidism    Migraines    Sleep apnea    Type 1 diabetes mellitus ([place])     Past Surgical History:  Procedure Laterality Date   APPENDECTOMY  2012   COLONOSCOPY WITH PROPOFOL N/A 10/15/2019   Procedure: COLONOSCOPY WITH PROPOFOL;  Surgeon: Jonathon Bellows, MD;  Location: [facility] ENDOSCOPY;  Service: Gastroenterology;  Laterality: N/A;   ESOPHAGOGASTRODUODENOSCOPY (EGD) WITH PROPOFOL N/A 10/15/2019   Procedure: ESOPHAGOGASTRODUODENOSCOPY (EGD) WITH PROPOFOL;  Surgeon: Jonathon Bellows, MD;  Location: [facility] ENDOSCOPY;  Service: Gastroenterology;  Laterality: N/A;   TONSILLECTOMY AND ADENOIDECTOMY  2007   UPPER GI ENDOSCOPY      Family History  Problem Relation Age of Onset   Arthritis Maternal Grandmother    Hyperlipidemia Maternal Grandmother    Heart disease Maternal Grandmother    Hypertension Maternal Grandmother    Diabetes Maternal Grandmother        type 2   Heart attack Maternal Grandmother    Arthritis Maternal Grandfather    Heart disease Maternal Grandfather    Hypertension Maternal Grandfather    Heart attack Maternal Grandfather    Arthritis Mother  Hyperlipidemia Mother    Hypertension Mother    Diabetes Mother        type 2   Hyperlipidemia Father    Arthritis Paternal Grandmother    Arthritis Paternal Grandfather    Heart attack Paternal Aunt     SOCIAL HX: reviewed.    Current Outpatient Medications:    Atogepant (QULIPTA) 60 MG TABS, Take 60 mg by mouth daily., Disp: , Rfl:    albuterol (VENTOLIN HFA) 108 (90 Base) MCG/ACT inhaler, Inhale 2 puffs into the lungs every 6 (six) hours as  needed for wheezing or shortness of breath., Disp: 18 g, Rfl: 0   amitriptyline (ELAVIL) 25 MG tablet, TAKE 1 TABLET BY MOUTH AT BEDTIME., Disp: 90 tablet, Rfl: 1   atorvastatin (LIPITOR) 10 MG tablet, TAKE ONE TABLET BY MOUTH EVERY DAY, Disp: 30 tablet, Rfl: 6   azithromycin (ZITHROMAX) 250 MG tablet, Take 2 tablets x 1 day and then 1 tablet per day for four more days., Disp: 6 tablet, Rfl: 0   benzonatate (TESSALON) 100 MG capsule, Take 1 capsule (100 mg total) by mouth 3 (three) times daily as needed for cough., Disp: 21 capsule, Rfl: 0   Blood Glucose Monitoring Suppl ([place]) w/Device KIT, See admin instructions., Disp: , Rfl:    CONTOUR NEXT TEST test strip, , Disp: , Rfl: 1   folic acid (FOLVITE) 903 MCG tablet, Take by mouth., Disp: , Rfl:    gabapentin (NEURONTIN) 300 MG capsule, Start 382m tablet at bedtime. Titrate as needed to max 2 tabs twice daily., Disp: , Rfl:    insulin lispro (HUMALOG) 100 UNIT/ML injection, As directed.  Dispense 4 vials., Disp: 10 mL, Rfl: 5   ketorolac (TORADOL) 10 MG tablet, Take 10 mg by mouth once as needed., Disp: , Rfl:    levothyroxine (SYNTHROID) 125 MCG tablet, Take 125 mcg by mouth daily., Disp: , Rfl:    naratriptan (AMERGE) 2.5 MG tablet, Take 2.5 mg by mouth 2 (two) times daily as needed., Disp: , Rfl:    NOVOLOG 100 UNIT/ML injection, , Disp: , Rfl: 11   omeprazole (PRILOSEC) 20 MG capsule, TAKE 1 CAPSULE BY MOUTH EVERY DAY, Disp: 90 capsule, Rfl: 0   promethazine (PHENERGAN) 12.5 MG tablet, Take 12.5 mg by mouth every 6 (six) hours as needed., Disp: , Rfl:    ranitidine (ZANTAC) 150 MG tablet, Take 150 mg by mouth at bedtime., Disp: , Rfl:    Rimegepant Sulfate (NURTEC) 75 MG TBDP, Take by mouth., Disp: , Rfl:    topiramate (TOPAMAX) 25 MG tablet, TAKE 1 TO 2 TABLETS BY MOUTH EVERY DAY, Disp: 60 tablet, Rfl: 1  EXAM:  GENERAL: alert, oriented, appears in no acute distress.  Is lying in bed (with headache).    HEENT:  atraumatic, conjunttiva clear, no obvious abnormalities on inspection of external nose and ears  NECK: normal movements of the head and neck  LUNGS: on inspection no signs of respiratory distress, breathing rate appears normal, no obvious gross SOB, gasping or wheezing  CV: no obvious cyanosis  PSYCH/NEURO: pleasant and cooperative, no obvious depression or anxiety, speech and thought processing grossly intact  ASSESSMENT AND PLAN:  Discussed the following assessment and plan:  Problem List Items Addressed This Visit     Elevated alkaline phosphatase level    Abdominal ultrasound ok.  Recheck liver panel.  Will also check GGT.        GERD (gastroesophageal reflux disease)    Continue prilosec.  Headache    Persistent issue with increased headache as outlined.  Has tried various medications.  Seeing neurology.  Recently started on aimovig.  Not helping.  Has nurtec, topamax and etodolac.  Discussed the need for f/u with neurology.  I contacted neurology and they will contact him regarding further treatment and f/u.         Relevant Medications   Atogepant (QULIPTA) 60 MG TABS   History of COVID-19    Recent diagnosis of covid.  Reports no residual problems from covid.  No persistent cough or congestion.  No sob.  Follow.        Hypercholesterolemia    On lipitor. Low cholesterol diet and exercise.  Follow lipid panel and liver function tests.         Hypothyroidism    On thyroid replacement.  Follow tsh.        Neuropathy    Continues on gabapentin and amitriptyline.  Stable.        Obstructive sleep apnea    CPAP.        Type 1 diabetes mellitus (HCC)    Low carb diet and exercise.  CGM.  No significant low sugars.  Continue f/u with endocrinology.  Schedule fasting labs to f/u met b and a1c.         Return in about 4 months (around 06/27/2021) for follow up appt (32mn).   I discussed the assessment and treatment plan with the patient. The  patient was provided an opportunity to ask questions and all were answered. The patient agreed with the plan and demonstrated an understanding of the instructions.   The patient was advised to call back or seek an in-person evaluation if the symptoms worsen or if the condition fails to improve as anticipated.    CEinar Pheasant MD

## 2021-03-02 ENCOUNTER — Telehealth: Payer: Self-pay | Admitting: Internal Medicine

## 2021-03-02 ENCOUNTER — Encounter: Payer: Self-pay | Admitting: Internal Medicine

## 2021-03-02 DIAGNOSIS — Z8616 Personal history of COVID-19: Secondary | ICD-10-CM | POA: Insufficient documentation

## 2021-03-02 NOTE — Assessment & Plan Note (Signed)
CPAP.  

## 2021-03-02 NOTE — Assessment & Plan Note (Signed)
Low carb diet and exercise.  CGM.  No significant low sugars.  Continue f/u with endocrinology.  Schedule fasting labs to f/u met b and a1c.

## 2021-03-02 NOTE — Assessment & Plan Note (Signed)
Recent diagnosis of covid.  Reports no residual problems from covid.  No persistent cough or congestion.  No sob.  Follow.

## 2021-03-02 NOTE — Telephone Encounter (Signed)
Needs a f/u appt in 4 months.  Also needs fasting labs within the next 7-10 days.  Thanks.

## 2021-03-02 NOTE — Assessment & Plan Note (Signed)
Abdominal ultrasound ok.  Recheck liver panel.  Will also check GGT.

## 2021-03-02 NOTE — Assessment & Plan Note (Signed)
On thyroid replacement.  Follow tsh.  

## 2021-03-02 NOTE — Assessment & Plan Note (Signed)
Continue prilosec

## 2021-03-02 NOTE — Assessment & Plan Note (Signed)
Continues on gabapentin and amitriptyline.  Stable.

## 2021-03-02 NOTE — Assessment & Plan Note (Signed)
Persistent issue with increased headache as outlined.  Has tried various medications.  Seeing neurology.  Recently started on aimovig.  Not helping.  Has nurtec, topamax and etodolac.  Discussed the need for f/u with neurology.  I contacted neurology and they will contact him regarding further treatment and f/u.

## 2021-03-02 NOTE — Assessment & Plan Note (Signed)
On lipitor.  Low cholesterol diet and exercise.  Follow lipid panel and liver function tests.   

## 2021-03-03 NOTE — Telephone Encounter (Signed)
LMTCB and schedule appts below

## 2021-03-06 ENCOUNTER — Telehealth: Payer: Self-pay | Admitting: Internal Medicine

## 2021-03-06 NOTE — Telephone Encounter (Signed)
Please schedule Eric Hayden for fasting labs within the next 1-2 weeks.

## 2021-03-06 NOTE — Telephone Encounter (Signed)
LMTCB and schedule appt.

## 2021-03-06 NOTE — Telephone Encounter (Signed)
Patient has been scheduled for a fasting lab appt and follow up visit.

## 2021-03-11 ENCOUNTER — Other Ambulatory Visit (INDEPENDENT_AMBULATORY_CARE_PROVIDER_SITE_OTHER): Payer: 59

## 2021-03-11 ENCOUNTER — Other Ambulatory Visit: Payer: Self-pay

## 2021-03-11 DIAGNOSIS — E039 Hypothyroidism, unspecified: Secondary | ICD-10-CM

## 2021-03-11 DIAGNOSIS — E1040 Type 1 diabetes mellitus with diabetic neuropathy, unspecified: Secondary | ICD-10-CM | POA: Diagnosis not present

## 2021-03-11 DIAGNOSIS — Z125 Encounter for screening for malignant neoplasm of prostate: Secondary | ICD-10-CM | POA: Diagnosis not present

## 2021-03-11 DIAGNOSIS — R748 Abnormal levels of other serum enzymes: Secondary | ICD-10-CM | POA: Diagnosis not present

## 2021-03-11 DIAGNOSIS — E7800 Pure hypercholesterolemia, unspecified: Secondary | ICD-10-CM

## 2021-03-11 LAB — MICROALBUMIN / CREATININE URINE RATIO
Creatinine,U: 336.2 mg/dL
Microalb Creat Ratio: 0.5 mg/g (ref 0.0–30.0)
Microalb, Ur: 1.8 mg/dL (ref 0.0–1.9)

## 2021-03-11 LAB — PSA: PSA: 1.28 ng/mL (ref 0.10–4.00)

## 2021-03-11 LAB — LIPID PANEL
Cholesterol: 144 mg/dL (ref 0–200)
HDL: 34.6 mg/dL — ABNORMAL LOW (ref >39.00)
LDL Cholesterol: 78 mg/dL (ref 0–99)
NonHDL: 109.62
Total CHOL/HDL Ratio: 4
Triglycerides: 157 mg/dL — ABNORMAL HIGH (ref 0.0–149.0)
VLDL: 31.4 mg/dL (ref 0.0–40.0)

## 2021-03-11 LAB — GAMMA GT: GGT: 14 U/L (ref 7–51)

## 2021-03-11 LAB — TSH: TSH: 0.05 u[IU]/mL — ABNORMAL LOW (ref 0.35–5.50)

## 2021-03-11 LAB — HEMOGLOBIN A1C: Hgb A1c MFr Bld: 7.1 % — ABNORMAL HIGH (ref 4.6–6.5)

## 2021-03-11 LAB — BASIC METABOLIC PANEL
BUN: 12 mg/dL (ref 6–23)
CO2: 22 mEq/L (ref 19–32)
Calcium: 9.3 mg/dL (ref 8.4–10.5)
Chloride: 106 mEq/L (ref 96–112)
Creatinine, Ser: 1.09 mg/dL (ref 0.40–1.50)
GFR: 82.59 mL/min (ref >60.00)
Glucose, Bld: 147 mg/dL — ABNORMAL HIGH (ref 70–99)
Potassium: 4 mEq/L (ref 3.5–5.1)
Sodium: 142 mEq/L (ref 135–145)

## 2021-03-11 LAB — HEPATIC FUNCTION PANEL
ALT: 13 U/L (ref 0–53)
AST: 14 U/L (ref 0–37)
Albumin: 4 g/dL (ref 3.5–5.2)
Alkaline Phosphatase: 100 U/L (ref 39–117)
Bilirubin, Direct: 0.1 mg/dL (ref 0.0–0.3)
Total Bilirubin: 0.5 mg/dL (ref 0.2–1.2)
Total Protein: 6.9 g/dL (ref 6.0–8.3)

## 2021-03-12 LAB — PARATHYROID HORMONE, INTACT (NO CA): PTH: 20 pg/mL (ref 16–77)

## 2021-03-16 ENCOUNTER — Telehealth: Payer: Self-pay

## 2021-03-16 NOTE — Telephone Encounter (Signed)
LMTCB to call back for lab results.

## 2021-03-17 NOTE — Telephone Encounter (Signed)
See result note.  

## 2021-03-17 NOTE — Telephone Encounter (Signed)
Patient is returning your call,please call him at [phone].

## 2021-03-26 MED ORDER — LEVOTHYROXINE SODIUM 112 MCG PO TABS: 112.0000 ug | ORAL_TABLET | Freq: Every day | ORAL | 1 refills | Status: DC

## 2021-03-26 NOTE — Addendum Note (Signed)
Addended by: Lars Masson on: 03/26/2021 10:01 AM   Modules accepted: Orders

## 2021-04-10 ENCOUNTER — Other Ambulatory Visit: Payer: Self-pay | Admitting: Internal Medicine

## 2021-05-04 ENCOUNTER — Other Ambulatory Visit: Payer: Self-pay

## 2021-05-04 ENCOUNTER — Other Ambulatory Visit (INDEPENDENT_AMBULATORY_CARE_PROVIDER_SITE_OTHER): Payer: 59

## 2021-05-04 DIAGNOSIS — E039 Hypothyroidism, unspecified: Secondary | ICD-10-CM | POA: Diagnosis not present

## 2021-05-04 DIAGNOSIS — Z23 Encounter for immunization: Secondary | ICD-10-CM | POA: Diagnosis not present

## 2021-05-04 LAB — TSH: TSH: 2.01 u[IU]/mL (ref 0.35–5.50)

## 2021-05-07 ENCOUNTER — Encounter: Payer: Self-pay | Admitting: Internal Medicine

## 2021-05-07 DIAGNOSIS — E1040 Type 1 diabetes mellitus with diabetic neuropathy, unspecified: Secondary | ICD-10-CM

## 2021-05-07 DIAGNOSIS — L989 Disorder of the skin and subcutaneous tissue, unspecified: Secondary | ICD-10-CM

## 2021-05-07 NOTE — Telephone Encounter (Signed)
I have placed an order for dermatology referral.  Please notify pt.  Also, if he needs me to see him, let me know.

## 2021-05-07 NOTE — Telephone Encounter (Signed)
Order placed for dermatology referral.

## 2021-05-08 NOTE — Telephone Encounter (Signed)
LMTCB

## 2021-06-15 ENCOUNTER — Other Ambulatory Visit: Payer: Self-pay | Admitting: Internal Medicine

## 2021-07-07 ENCOUNTER — Other Ambulatory Visit: Payer: Self-pay

## 2021-07-07 ENCOUNTER — Encounter: Payer: Self-pay | Admitting: Internal Medicine

## 2021-07-07 ENCOUNTER — Ambulatory Visit (INDEPENDENT_AMBULATORY_CARE_PROVIDER_SITE_OTHER): Payer: 59 | Admitting: Internal Medicine

## 2021-07-07 VITALS — BP 108/70 | HR 88 | Temp 98.2°F | Resp 14 | Ht 67.0 in | Wt 186.5 lb

## 2021-07-07 DIAGNOSIS — E039 Hypothyroidism, unspecified: Secondary | ICD-10-CM | POA: Diagnosis not present

## 2021-07-07 DIAGNOSIS — E1040 Type 1 diabetes mellitus with diabetic neuropathy, unspecified: Secondary | ICD-10-CM

## 2021-07-07 DIAGNOSIS — K219 Gastro-esophageal reflux disease without esophagitis: Secondary | ICD-10-CM

## 2021-07-07 DIAGNOSIS — R748 Abnormal levels of other serum enzymes: Secondary | ICD-10-CM

## 2021-07-07 DIAGNOSIS — R519 Headache, unspecified: Secondary | ICD-10-CM

## 2021-07-07 DIAGNOSIS — G4733 Obstructive sleep apnea (adult) (pediatric): Secondary | ICD-10-CM

## 2021-07-07 DIAGNOSIS — E7800 Pure hypercholesterolemia, unspecified: Secondary | ICD-10-CM | POA: Diagnosis not present

## 2021-07-07 DIAGNOSIS — K900 Celiac disease: Secondary | ICD-10-CM

## 2021-07-07 DIAGNOSIS — G629 Polyneuropathy, unspecified: Secondary | ICD-10-CM

## 2021-07-07 DIAGNOSIS — H9209 Otalgia, unspecified ear: Secondary | ICD-10-CM

## 2021-07-07 LAB — HEPATIC FUNCTION PANEL
ALT: 29 U/L (ref 0–53)
AST: 20 U/L (ref 0–37)
Albumin: 4.1 g/dL (ref 3.5–5.2)
Alkaline Phosphatase: 86 U/L (ref 39–117)
Bilirubin, Direct: 0.1 mg/dL (ref 0.0–0.3)
Total Bilirubin: 0.7 mg/dL (ref 0.2–1.2)
Total Protein: 6.7 g/dL (ref 6.0–8.3)

## 2021-07-07 LAB — BASIC METABOLIC PANEL
BUN: 20 mg/dL (ref 6–23)
CO2: 30 mEq/L (ref 19–32)
Calcium: 9.4 mg/dL (ref 8.4–10.5)
Chloride: 101 mEq/L (ref 96–112)
Creatinine, Ser: 0.98 mg/dL (ref 0.40–1.50)
GFR: 93.62 mL/min (ref >60.00)
Glucose, Bld: 106 mg/dL — ABNORMAL HIGH (ref 70–99)
Potassium: 4.4 mEq/L (ref 3.5–5.1)
Sodium: 139 mEq/L (ref 135–145)

## 2021-07-07 LAB — CBC WITH DIFFERENTIAL/PLATELET
Basophils Absolute: 0.1 10*3/uL (ref 0.0–0.1)
Basophils Relative: 0.9 % (ref 0.0–3.0)
Eosinophils Absolute: 0.6 10*3/uL (ref 0.0–0.7)
Eosinophils Relative: 6.3 % — ABNORMAL HIGH (ref 0.0–5.0)
HCT: 44.5 % (ref 39.0–52.0)
Hemoglobin: 14.7 g/dL (ref 13.0–17.0)
Lymphocytes Relative: 30.8 % (ref 12.0–46.0)
Lymphs Abs: 2.9 10*3/uL (ref 0.7–4.0)
MCHC: 33.1 g/dL (ref 30.0–36.0)
MCV: 89.3 fl (ref 78.0–100.0)
Monocytes Absolute: 1 10*3/uL (ref 0.1–1.0)
Monocytes Relative: 10.5 % (ref 3.0–12.0)
Neutro Abs: 4.9 10*3/uL (ref 1.4–7.7)
Neutrophils Relative %: 51.5 % (ref 43.0–77.0)
Platelets: 233 10*3/uL (ref 150.0–400.0)
RBC: 4.98 Mil/uL (ref 4.22–5.81)
RDW: 13.1 % (ref 11.5–15.5)
WBC: 9.5 10*3/uL (ref 4.0–10.5)

## 2021-07-07 LAB — TSH: TSH: 15.01 u[IU]/mL — ABNORMAL HIGH (ref 0.35–5.50)

## 2021-07-07 LAB — LIPID PANEL
Cholesterol: 169 mg/dL (ref 0–200)
HDL: 40.9 mg/dL (ref >39.00)
LDL Cholesterol: 95 mg/dL (ref 0–99)
NonHDL: 127.65
Total CHOL/HDL Ratio: 4
Triglycerides: 164 mg/dL — ABNORMAL HIGH (ref 0.0–149.0)
VLDL: 32.8 mg/dL (ref 0.0–40.0)

## 2021-07-07 LAB — VITAMIN D 25 HYDROXY (VIT D DEFICIENCY, FRACTURES): VITD: 15.71 ng/mL — ABNORMAL LOW (ref 30.00–100.00)

## 2021-07-07 LAB — VITAMIN B12: Vitamin B-12: 681 pg/mL (ref 211–911)

## 2021-07-07 MED ORDER — CEFDINIR 300 MG PO CAPS: 300.0000 mg | ORAL_CAPSULE | Freq: Two times a day (BID) | ORAL | 0 refills | Status: DC

## 2021-07-07 NOTE — Progress Notes (Signed)
Patient ID: Eric Hayden, male   DOB: 1975/12/12, 45 y.o.   MRN: [id number]   Subjective:    Patient ID: Eric Hayden, male    DOB: 1976-04-05, 45 y.o.   MRN: [id number]  This visit occurred during the SARS-CoV-2 public health emergency.  Safety protocols were in place, including screening questions prior to the visit, additional usage of staff PPE, and extensive cleaning of exam room while observing appropriate contact time as indicated for disinfecting solutions.   Patient here for a scheduled follow up.   Chief Complaint  Patient presents with   Follow-up   Ear Pain   Ear Fullness    Left ear feels swollen and full visibly occluded with cerumen.   Marland Kitchen   HPI Here to follow up regarding his diabetes, cholesterol and blood pressure.  Reports had sinus infection two weeks ago.  Took tylenol cold and sinus.  Sinus symptoms have improved.  He is having pain left ear/ear canal and anterior to ear - swelling and tenderness.  Increased pain and decreased hearing.  Increased pain last night - 7-8/10 pain. Some better today.  No fever.  Took antiinflammatory last night.  Has a history of headaches.  Seeing neurology.  Has tried multiple treatment modalities.  Still persistent intermittent vertigo and headaches.  No chest pain.  Breathing stable.  No increased cough or congestion.  No abdominal pain.  Bowels moving.     Past Medical History:  Diagnosis Date   Asthma    Cancer ([place])    appendiceal   Chicken pox    GERD (gastroesophageal reflux disease)    Heart murmur    Hx of oral aphthous ulcers    Hypothyroidism    Migraines    Sleep apnea    Type 1 diabetes mellitus ([place])    Past Surgical History:  Procedure Laterality Date   APPENDECTOMY  2012   COLONOSCOPY WITH PROPOFOL N/A 10/15/2019   Procedure: COLONOSCOPY WITH PROPOFOL;  Surgeon: Jonathon Bellows, MD;  Location: [facility] ENDOSCOPY;  Service: Gastroenterology;  Laterality: N/A;   ESOPHAGOGASTRODUODENOSCOPY (EGD) WITH PROPOFOL N/A 10/15/2019    Procedure: ESOPHAGOGASTRODUODENOSCOPY (EGD) WITH PROPOFOL;  Surgeon: Jonathon Bellows, MD;  Location: [facility] ENDOSCOPY;  Service: Gastroenterology;  Laterality: N/A;   TONSILLECTOMY AND ADENOIDECTOMY  2007   UPPER GI ENDOSCOPY     Family History  Problem Relation Age of Onset   Arthritis Maternal Grandmother    Hyperlipidemia Maternal Grandmother    Heart disease Maternal Grandmother    Hypertension Maternal Grandmother    Diabetes Maternal Grandmother        type 2   Heart attack Maternal Grandmother    Arthritis Maternal Grandfather    Heart disease Maternal Grandfather    Hypertension Maternal Grandfather    Heart attack Maternal Grandfather    Arthritis Mother    Hyperlipidemia Mother    Hypertension Mother    Diabetes Mother        type 2   Hyperlipidemia Father    Arthritis Paternal Grandmother    Arthritis Paternal Grandfather    Heart attack Paternal Aunt    Social History   Socioeconomic History   Marital status: Married    Spouse name: Not on file   Number of children: 2   Years of education: Not on file   Highest education level: Not on file  Occupational History   Not on file  Tobacco Use   Smoking status: Never   Smokeless tobacco: Never  Vaping Use  Vaping Use: Never used  Substance and Sexual Activity   Alcohol use: No    Alcohol/week: 0.0 standard drinks   Drug use: No   Sexual activity: Not on file  Other Topics Concern   Not on file  Social History Narrative   Not on file   Social Determinants of Health   Financial Resource Strain: Not on file  Food Insecurity: Not on file  Transportation Needs: Not on file  Physical Activity: Not on file  Stress: Not on file  Social Connections: Not on file     Review of Systems  Constitutional:  Negative for appetite change and fever.  HENT:         Minimal nasal congestion.  No sore throat.  No significant sinus pressure. Increased left ear pain and pain - anterior ear - above angle of jaw.   Decreased hearing.    Respiratory:  Negative for cough, chest tightness and shortness of breath.   Cardiovascular:  Negative for chest pain, palpitations and leg swelling.  Gastrointestinal:  Negative for abdominal pain, diarrhea, nausea and vomiting.  Genitourinary:  Negative for difficulty urinating and dysuria.  Musculoskeletal:  Negative for joint swelling and myalgias.  Skin:  Negative for color change and rash.  Neurological:  Positive for headaches.       Intermittent headaches and vertigo as outlined.    Psychiatric/Behavioral:  Negative for agitation and dysphoric mood.       Objective:     BP 108/70   Pulse 88   Temp 98.2 F (36.8 C) (Oral)   Resp 14   Ht 5' 7"  (1.702 m)   Wt 186 lb 8 oz (84.6 kg)   SpO2 98%   BMI 29.21 kg/m  Wt Readings from Last 3 Encounters:  07/07/21 186 lb 8 oz (84.6 kg)  02/24/21 175 lb (79.4 kg)  04/25/20 180 lb (81.6 kg)    Physical Exam Constitutional:      General: He is not in acute distress.    Appearance: Normal appearance. He is well-developed.  HENT:     Head: Normocephalic and atraumatic.     Comments: Increased soft tissue swelling - above angle of jaw.  Increased pain to palpation over this area.  Erythema - canal.  Cerumen impaction bilateral ears.  Unable to visualize the ear drum.   Eyes:     General: No scleral icterus.       Right eye: No discharge.        Left eye: No discharge.  Cardiovascular:     Rate and Rhythm: Normal rate and regular rhythm.  Pulmonary:     Effort: Pulmonary effort is normal. No respiratory distress.     Breath sounds: Normal breath sounds.  Abdominal:     General: Bowel sounds are normal.     Palpations: Abdomen is soft.     Tenderness: There is no abdominal tenderness.  Musculoskeletal:        General: No swelling or tenderness.     Cervical back: Neck supple. No tenderness.  Lymphadenopathy:     Cervical: No cervical adenopathy.  Skin:    Findings: No erythema or rash.  Neurological:      Mental Status: He is alert.  Psychiatric:        Mood and Affect: Mood normal.        Behavior: Behavior normal.     Outpatient Encounter Medications as of 07/07/2021  Medication Sig   cefdinir (OMNICEF) 300 MG capsule Take 1 capsule (  300 mg total) by mouth 2 (two) times daily.   albuterol (VENTOLIN HFA) 108 (90 Base) MCG/ACT inhaler Inhale 2 puffs into the lungs every 6 (six) hours as needed for wheezing or shortness of breath.   amitriptyline (ELAVIL) 25 MG tablet TAKE 1 TABLET BY MOUTH AT BEDTIME.   atorvastatin (LIPITOR) 10 MG tablet TAKE ONE TABLET BY MOUTH EVERY DAY   Blood Glucose Monitoring Suppl ([place]) w/Device KIT See admin instructions.   CONTOUR NEXT TEST test strip    folic acid (FOLVITE) 628 MCG tablet Take by mouth.   gabapentin (NEURONTIN) 300 MG capsule Start 357m tablet at bedtime. Titrate as needed to max 2 tabs twice daily.   insulin lispro (HUMALOG) 100 UNIT/ML injection As directed.  Dispense 4 vials.   ketorolac (TORADOL) 10 MG tablet Take 10 mg by mouth once as needed.   levothyroxine (SYNTHROID) 112 MCG tablet TAKE 1 TABLET EVERY DAY ON EMPTY STOMACHWITH A GLASS OF WATER AT LEAST 30-60 MINBEFORE BREAKFAST   naratriptan (AMERGE) 2.5 MG tablet Take 2.5 mg by mouth 2 (two) times daily as needed.   NOVOLOG 100 UNIT/ML injection    omeprazole (PRILOSEC) 20 MG capsule TAKE 1 CAPSULE BY MOUTH EVERY DAY   promethazine (PHENERGAN) 12.5 MG tablet Take 12.5 mg by mouth every 6 (six) hours as needed.   ranitidine (ZANTAC) 150 MG tablet Take 150 mg by mouth at bedtime.   Rimegepant Sulfate (NURTEC) 75 MG TBDP Take by mouth.   topiramate (TOPAMAX) 25 MG tablet TAKE 1 TO 2 TABLETS BY MOUTH EVERY DAY   [DISCONTINUED] Atogepant (QULIPTA) 60 MG TABS Take 60 mg by mouth daily.   [DISCONTINUED] azithromycin (ZITHROMAX) 250 MG tablet Take 2 tablets x 1 day and then 1 tablet per day for four more days. (Patient not taking: Reported on 07/07/2021)    [DISCONTINUED] benzonatate (TESSALON) 100 MG capsule Take 1 capsule (100 mg total) by mouth 3 (three) times daily as needed for cough. (Patient not taking: Reported on 07/07/2021)   No facility-administered encounter medications on file as of 07/07/2021.     Lab Results  Component Value Date   WBC 9.5 07/07/2021   HGB 14.7 07/07/2021   HCT 44.5 07/07/2021   PLT 233.0 07/07/2021   GLUCOSE 106 (H) 07/07/2021   CHOL 169 07/07/2021   TRIG 164.0 (H) 07/07/2021   HDL 40.90 07/07/2021   LDLCALC 95 07/07/2021   ALT 29 07/07/2021   AST 20 07/07/2021   NA 139 07/07/2021   K 4.4 07/07/2021   CL 101 07/07/2021   CREATININE 0.98 07/07/2021   BUN 20 07/07/2021   CO2 30 07/07/2021   TSH 15.01 (H) 07/07/2021   PSA 1.28 03/11/2021   HGBA1C 7.1 (H) 03/11/2021   MICROALBUR 1.8 03/11/2021       Assessment & Plan:   Problem List Items Addressed This Visit     Celiac disease    If watches diet - stable.  Check vitamin B12 and vitamin D      Relevant Orders   VITAMIN D 25 Hydroxy (Vit-D Deficiency, Fractures) (Completed)   Vitamin B12 (Completed)   Earache    Increased pain - inner ear and increased pain and soft tissue swelling - anterior ear.  Swelling reported as better today. Increased pain to palpation.  Can continue antiinflammatory medication as discussed.  Unable to visualize TM.  Increased cerumen. Given pain, want to avoid ear irrigation.  Treat with omnicef as directed.  Decreased hearing.  Given  increased pain, swelling and decreased hearing, needs ENT evaluation.  Urgent referral placed.        Relevant Orders   Ambulatory referral to ENT   Elevated alkaline phosphatase level    Abdominal ultrasound ok.  Recheck liver panel.        GERD (gastroesophageal reflux disease)    Continue prilosec.       Headache    Persistent issue with increased headache as outlined.  Has tried various medications.  Seeing neurology. Notes reviewed.        Hypercholesterolemia    On  lipitor. Low cholesterol diet and exercise.  Follow lipid panel and liver function tests.        Relevant Orders   CBC with Differential/Platelet (Completed)   Hepatic function panel (Completed)   Lipid panel (Completed)   Basic metabolic panel (Completed)   Hypothyroidism    On thyroid replacement.  Follow tsh.       Relevant Orders   TSH (Completed)   Neuropathy    Continues on gabapentin and amitriptyline.  Stable.       Obstructive sleep apnea    CPAP.       Type 1 diabetes mellitus (H[facility]) - Primary    Low carb diet and exercise.  CGM.  No significant low sugars.  Continue f/u with endocrinology.  Check met b and a1c.       Relevant Orders   Basic metabolic panel (Completed)     Einar Pheasant, MD

## 2021-07-08 ENCOUNTER — Encounter: Payer: Self-pay | Admitting: Internal Medicine

## 2021-07-08 DIAGNOSIS — H9209 Otalgia, unspecified ear: Secondary | ICD-10-CM | POA: Insufficient documentation

## 2021-07-08 DIAGNOSIS — E1040 Type 1 diabetes mellitus with diabetic neuropathy, unspecified: Secondary | ICD-10-CM

## 2021-07-08 MED ORDER — ATORVASTATIN CALCIUM 20 MG PO TABS: 20.0000 mg | ORAL_TABLET | Freq: Every day | ORAL | 1 refills | Status: DC

## 2021-07-08 MED ORDER — LEVOTHYROXINE SODIUM 125 MCG PO TABS: 125.0000 ug | ORAL_TABLET | Freq: Every day | ORAL | 1 refills | Status: DC

## 2021-07-08 NOTE — Assessment & Plan Note (Signed)
CPAP.  

## 2021-07-08 NOTE — Assessment & Plan Note (Signed)
If watches diet - stable.  Check vitamin B12 and vitamin D

## 2021-07-08 NOTE — Assessment & Plan Note (Signed)
Continue prilosec

## 2021-07-08 NOTE — Assessment & Plan Note (Signed)
Abdominal ultrasound ok.  Recheck liver panel.

## 2021-07-08 NOTE — Assessment & Plan Note (Signed)
Increased pain - inner ear and increased pain and soft tissue swelling - anterior ear.  Swelling reported as better today. Increased pain to palpation.  Can continue antiinflammatory medication as discussed.  Unable to visualize TM.  Increased cerumen. Given pain, want to avoid ear irrigation.  Treat with omnicef as directed.  Decreased hearing.  Given increased pain, swelling and decreased hearing, needs ENT evaluation.  Urgent referral placed.

## 2021-07-08 NOTE — Assessment & Plan Note (Signed)
On thyroid replacement.  Follow tsh.  

## 2021-07-08 NOTE — Assessment & Plan Note (Signed)
Persistent issue with increased headache as outlined.  Has tried various medications.  Seeing neurology. Notes reviewed.

## 2021-07-08 NOTE — Assessment & Plan Note (Signed)
Low carb diet and exercise.  CGM.  No significant low sugars.  Continue f/u with endocrinology.  Check met b and a1c.

## 2021-07-08 NOTE — Assessment & Plan Note (Signed)
On lipitor.  Low cholesterol diet and exercise.  Follow lipid panel and liver function tests.   

## 2021-07-08 NOTE — Addendum Note (Signed)
Addended by: Elpidio Galea T on: 07/08/2021 02:53 PM   Modules accepted: Orders

## 2021-07-08 NOTE — Telephone Encounter (Signed)
Please call pt - Please see lab result note for f/u ear and ENT referral.  Also, please notify of lab results.  Also, endocrinologists - Dr Gabriel Carina and Dr Honor Junes in [place].  Dr Reynold Bowen - [place].  Dr Benjiman Core [facility] endocrinology.

## 2021-07-08 NOTE — Assessment & Plan Note (Signed)
Continues on gabapentin and amitriptyline.  Stable.

## 2021-07-09 NOTE — Telephone Encounter (Signed)
Please call him and let him know that I I am covered, [facility] I would think would be covered.  These are some names of physicians that we use.  He will need to check his insurance to see if covered.

## 2021-07-09 NOTE — Telephone Encounter (Signed)
Patient changing to [facility] 08/02/21 he just wants to make sure the Endocrinologist is within that network or than that PCP preference for Endo

## 2021-08-01 NOTE — Telephone Encounter (Signed)
Order placed for endocrinology referral.

## 2021-08-04 DIAGNOSIS — E1142 Type 2 diabetes mellitus with diabetic polyneuropathy: Secondary | ICD-10-CM | POA: Diagnosis not present

## 2021-08-04 DIAGNOSIS — G43019 Migraine without aura, intractable, without status migrainosus: Secondary | ICD-10-CM | POA: Diagnosis not present

## 2021-08-04 DIAGNOSIS — R42 Dizziness and giddiness: Secondary | ICD-10-CM | POA: Diagnosis not present

## 2021-08-04 DIAGNOSIS — G4733 Obstructive sleep apnea (adult) (pediatric): Secondary | ICD-10-CM | POA: Diagnosis not present

## 2021-08-04 DIAGNOSIS — G43809 Other migraine, not intractable, without status migrainosus: Secondary | ICD-10-CM | POA: Diagnosis not present

## 2021-08-20 ENCOUNTER — Telehealth: Payer: Self-pay | Admitting: *Deleted

## 2021-08-20 DIAGNOSIS — E039 Hypothyroidism, unspecified: Secondary | ICD-10-CM

## 2021-08-20 NOTE — Telephone Encounter (Signed)
Order placed for f/u tsh.  

## 2021-08-20 NOTE — Telephone Encounter (Signed)
Please place future orders for lab appt.  

## 2021-08-26 ENCOUNTER — Other Ambulatory Visit: Payer: 59

## 2021-09-04 ENCOUNTER — Other Ambulatory Visit: Payer: Self-pay

## 2021-09-04 ENCOUNTER — Other Ambulatory Visit (INDEPENDENT_AMBULATORY_CARE_PROVIDER_SITE_OTHER): Payer: 59

## 2021-09-04 DIAGNOSIS — E039 Hypothyroidism, unspecified: Secondary | ICD-10-CM

## 2021-09-05 LAB — TSH: TSH: 4.65 u[IU]/mL — ABNORMAL HIGH (ref 0.450–4.500)

## 2021-09-07 ENCOUNTER — Other Ambulatory Visit: Payer: Self-pay

## 2021-09-07 MED ORDER — LEVOTHYROXINE SODIUM 137 MCG PO TABS: 137.0000 ug | ORAL_TABLET | Freq: Every day | ORAL | 0 refills | Status: DC

## 2021-10-13 ENCOUNTER — Other Ambulatory Visit: Payer: Self-pay | Admitting: Internal Medicine

## 2021-10-15 DIAGNOSIS — E1142 Type 2 diabetes mellitus with diabetic polyneuropathy: Secondary | ICD-10-CM | POA: Diagnosis not present

## 2021-10-15 DIAGNOSIS — G4733 Obstructive sleep apnea (adult) (pediatric): Secondary | ICD-10-CM | POA: Diagnosis not present

## 2021-10-15 DIAGNOSIS — G43809 Other migraine, not intractable, without status migrainosus: Secondary | ICD-10-CM | POA: Diagnosis not present

## 2021-10-16 ENCOUNTER — Other Ambulatory Visit: Payer: Self-pay | Admitting: Student

## 2021-10-16 DIAGNOSIS — G43809 Other migraine, not intractable, without status migrainosus: Secondary | ICD-10-CM

## 2021-10-24 DIAGNOSIS — G4733 Obstructive sleep apnea (adult) (pediatric): Secondary | ICD-10-CM | POA: Diagnosis not present

## 2021-10-30 ENCOUNTER — Other Ambulatory Visit: Payer: 59

## 2021-11-03 ENCOUNTER — Telehealth: Payer: Self-pay | Admitting: *Deleted

## 2021-11-03 ENCOUNTER — Encounter: Payer: Self-pay | Admitting: Internal Medicine

## 2021-11-03 ENCOUNTER — Ambulatory Visit: Payer: 59 | Admitting: Internal Medicine

## 2021-11-03 VITALS — BP 118/82 | HR 97 | Ht 67.0 in | Wt 195.2 lb

## 2021-11-03 DIAGNOSIS — E039 Hypothyroidism, unspecified: Secondary | ICD-10-CM

## 2021-11-03 DIAGNOSIS — E7800 Pure hypercholesterolemia, unspecified: Secondary | ICD-10-CM

## 2021-11-03 DIAGNOSIS — E1042 Type 1 diabetes mellitus with diabetic polyneuropathy: Secondary | ICD-10-CM

## 2021-11-03 LAB — POCT GLYCOSYLATED HEMOGLOBIN (HGB A1C): Hemoglobin A1C: 6.7 % — AB (ref 4.0–5.6)

## 2021-11-03 MED ORDER — FIASP 100 UNIT/ML IJ SOLN: INTRAMUSCULAR | 11 refills | Status: DC

## 2021-11-03 NOTE — Patient Instructions (Addendum)
Please call your insurance and ask about coverage for: ?- Tandem t:slim X2 ?- Dexcom G6 ? ?Please use the following pump settings: ?- basal rates: ?12 am: 1.9 units/h ?3 am: 2.0 ?6 am: 1.6 ?4 pm: 1.3 ?6 pm: 1.25 ?9 pm: 1.9 ?- ICR: 1:10 ?- Target: 100-140 except 6 am-9 pm ?- ISF: 40 ?- Active Insulin Time: 3:30 h ?- bolus wizard: on ? ?Try to change to FiAsp - injected at the start of the meal. If this is not covered, bolus Novolog 15 min before a meal. ? ?Please return in 3 months. ? ?Basic Rules for Patients with Type I Diabetes Mellitus ? ?The American Diabetes Association (ADA) recommended targets: ?- fasting sugar 80-130 ?- after meal sugar <180 ?- HbA1C <7% ? ?Engage in ?150 min moderate exercise per week ? ?Make sure you have ?8h of sleep every night as this helps both blood sugars and your weight. ? ?Always keep a sugar log (not only record in your meter) and bring it to all appointments with [place]. ? ?If you are on a pump, know how to access the settings and to modify the parameters. ? ? Remember, you can always call the number on the back of the pump for emergencies related to the pump. ? ??15-15 rule? for hypoglycemia: if sugars are low, take 15 g of carbs** (?fast sugar? - e.g. 4 glucose tablets, 4 oz orange juice), wait 15 min, then check sugars again. If still <80, repeat. Continue  ?until your sugars >80, then eat a normal meal.  ? ?Teach family members and coworkers to inject glucagon. Have a glucagon set at home and one at work. They should call 911 after using the set. ? ?If you are on a pump, set ?insulin on board? time for 5 hours (if your sugars tend to be higher, can use 4 hours).  ? ?If you are on a pump, use the ?dual wave bolus? setting for high fat foods (e.g. pizza). Start with a setting of 50%-50% (50% instant bolus and 50% prolonged bolus over 3h, for e.g.).   ? ?If you are on a pump, make sure the basal daily insulin dose is approximately equal (not larger) to the daily insulin you get from  boluses, otherwise you are at risk for hypoglycemia. ? ?Check sugar before driving. If <100, correct, and only start driving if sugars rise ?100. Check sugar every hour when on a long drive. ? ?Check sugar before exercising. If <100, correct, and only start exercising if sugars rise ?100. Check sugar every hour when on a long exercise routine and 1h after you finished exercising.  ? ?If >250, check urine for ketones. If you have moderate-large ketones in urine, do not start exercise. Hydrate yourself with clear liquids and correct the high sugar. Recheck sugars and ketones before attempting to exercise. ? ?Be aware that you might need less insulin when exercising.  ?*intense, short, exercise bursts can increase your sugars, but  ?*less intense, longer (>1h), exercise routines can decrease your sugars.  ?If you are on a pump, you might need to decrease your basal rate by 10% or more (or even disconnect your pump) while you exercise to prevent low sugars. Do not disconnect your pump by more than 3 hours at a time! You also might need to decrease your insulin bolus for the meal prior to your exercise time by 20% or more. ? ?Make sure you have a MedAlert bracelet or pendant mentioning ?Type I Diabetes Mellitus?. If you  have a prior episode of severe hypoglycemia or hypoglycemia unawareness, it should also mention this. ? ?Please do not walk barefoot. Inspect your feet for sores/cuts and let us know if you have them. ? ?**E.g. of ?fast carbs?: ?first choice (15 g):  ?1 tube glucose gel, ?GlucoPouch 15, ?2 oz glucose liquid ?second choice (15-16 g):  ?3 or 4 glucose tablets (best taken  with water), ?15 Dextrose Bits chewable ?third choice (15-20 g):  ?? cup fruit juice, ?? cup regular soda, ?1 cup skim milk,  ?1 cup sports drink ?fourth choice (15-20 g):  ?1 small tube [place] gel (not frosting), ?2 tbsp raisins, ?1 tbsp table sugar,  ?candy, jelly beans, gum drops - check package for carb amount ? ? (adapted from:  Lenice Pressman. ?Insulin therapy and hypoglycemia? Endocrinol Metab Clin N Am 2012, 41: 57-87) ? ?Sick Day Rules for Diabetes ? ?Think S-K-I-L-L: ? ?Sugars: ? - if glucose >200, check every 3h and drink sugar free liquids ? - if glucose <200, drink carb-containing liquids and recheck 30 min later  - if glucose high, correct with insulin ? - if sugars <60, initiate hypoglycemia management (take 15 g of fast carbs and check sugars in 15 min  - repeat until sugars remain >100). ? ?Ketones: ? When to check ketones?  ?When glucose >300 x2 if on insulin injections (>300 x 1 if on insulin pump). ?When nausea, vomiting, diarrhea, abdominal pain, headache, fever - even if glucose is normal or low - because in this case, you need both glucose and insulin. ?  ? - if you have ketone strips for blood >> if ketones are more or equal than 0.6, need to increase insulin ?- if you have ketone strips for urine >> if ketones are more or equal than "small", need to increase insulin ? ?Insulin: ?Never skip long acting insulin, even if not eating! ?  ?Urine ketones Blood ketones Extra insulin?  ?no <0.6 no  ?small 0.6-1.5 Increase dose by 5%  ?moderate 1.5-3 Increase dose by 10%  ?large >3 Increase dose by at least 20%  ? ?Liquids: ?- if glucose >200, check every 3h and drink sugar free liquids ? - if glucose <200, small sips of carb-containing liquids (e.g. Ginger ale, Gatorade, juice, etc.) ? ?Let us know! ?  ?Call us if: Go to ED if: Call your primary care doctor if:  ?Sugars >300 for >8h Severe abdominal pain Fever >100F for 24h  ?Moderate to large  urine ketones or blood ketones >1.5 Difficulty breathing Other chronic diseases flaring up  ?Vomiting and unable to keep liquids down Signs of dehydration   ? ? ? ?

## 2021-11-03 NOTE — Progress Notes (Addendum)
Patient ID: Eric Hayden, male   DOB: 1976-07-22, 46 y.o.   MRN: [id number] ? ?HPI: ?Eric Hayden is a 46 y.o.-year-old pleasant male, referred by his PCP, Dr. Nicki Hayden, for management of DM1, diagnosed at 5 months old -reportedly undetectable C-peptide, fairly well controlled, with complications (peripheral neuropathy, h/o diabetic retinopathy s/p distant laser therapy). ?He previously saw endocrinology in [facility] (Dr. Elisabeth Hayden), last office visit in 05/2021.  He had to stop seeing her due to change in insurance. ? ?Reviewed HbA1c levels: ? ?Lab Results  ?Component Value Date  ? HGBA1C 7.1 (H) 03/11/2021  ? HGBA1C 6.8 05/29/2019  ? HGBA1C 6.9 (H) 12/21/2017  ? ?Insulin pump:  ?- started 1999 ?- Medtronic 670 - out of warranty 04/2021 - but with [facility] technology ?Per endo note from 05/2021: ?Currently using MiniMed pump with CGM, and warranty just ran out.  ?Has had [employer] for awhile, and they have approved his pump supplies via [place] this summer when [place] stopped approving reservoirs (but they have continued to send everything EXCEPT the reservoirs). He received a letter saying that they cannot send resrvoirs b/c they had not received information that he had failed mdi (when he has been on pump > 39yr).  ? ?CGM: ?- Medtronic  ? ?Insulin: ?- NovoLog ? ?Supplies: ?- [employer] - having pbs with them... ? ?Pump settings: ?- basal rates: ?12 am: 1.9 units/h ?3 am: 2.0 ?6 am: 1.6 ?4 pm: 1.3 ?6 pm: 1.25 ?9 pm: 1.9 ?- ICR: 1:10 ?- target: 100-140 except 6 am-9 pm ?- ISF: 40 ?- Insulin on Board: 3:30 h ?- bolus wizard: on ?- changes infusion site: q2-3 days ?TDD from basal insulin: 46% (47.6 units) ?TDD from bolus insulin: 54% (55.6 units) ?Total daily dose: 103-130 ? ?Meter: One Touch Verio >> Contour Link >> AccuChek guide as insurance stopped covering the controlling which connected to his pump ? ?Pt checks his sugars >4 a day with his CGM: With his ? ? ?Lowest sugar was 40;  he has hypoglycemia awareness at 60. Hyperglycemia awareness at 240-250.   ?He has a glucagon kit at home. No previous hypoglycemia admission.  He has Baqsimi at home. ?Highest sugar was 321 (technical pb.). No previous DKA admissions.   ? ?Pt's meals are: ?- Breakfast: Cereal, juice, fruit ?- Lunch: salad, tea, sandwich ?- Dinner: Pasta, casserole, steak or chicken, vegetables, rice/potatoes, milk ?- Snacks: 3-4 ?He plays tennis for exercise. ? ?- no CKD, last BUN/creatinine:  ?Lab Results  ?Component Value Date  ? BUN 20 07/07/2021  ? BUN 12 03/11/2021  ? CREATININE 0.98 07/07/2021  ? CREATININE 1.09 03/11/2021  ?No ACEI/ ARBs. ? ?- + HL;  last set of lipids: ?Lab Results  ?Component Value Date  ? CHOL 169 07/07/2021  ? HDL 40.90 07/07/2021  ? [place]95 07/07/2021  ? TRIG 164.0 (H) 07/07/2021  ? CHOLHDL 4 07/07/2021  ?He is on Lipitor 10 mg daily. ? ?- last [facility] exam was in 2022. [facility] ? ?- + pain, numbness, and tingling in his feet.  On amitriptyline 25 mg daily at bedtime and gabapentin 600 mg 2x a day - per neurology. ?He was on Lyrica before >> not covered anymore.  ? ?He has family history of DM2 in mother, MGM, etc. DM1 in maternal uncle. ? ?Hypothyroidism: ?- dx'ed in ~2009 ? ?Pt is on levothyroxine 137 mcg daily (increased 09/2021), taken: ?- in am ?- fasting ?- at least 30  min from b'fast ?- no calcium ?- no iron ?- no multivitamins ?- no PPIs ?- not on Biotin ? ?Lab Results  ?Component Value Date  ? TSH 4.650 (H) 09/04/2021  ? TSH 15.01 (H) 07/07/2021  ? TSH 2.01 05/04/2021  ? TSH 0.05 (L) 03/11/2021  ? TSH 3.50 09/05/2019  ? TSH 1.40 05/17/2017  ? TSH 0.73 02/10/2016  ? TSH 0.44 07/25/2015  ? TSH 0.59 12/20/2014  ? TSH 5.11 04/06/2014  ? ?+ FH in M and F. No FH of thyroid cancer. No h/o radiation tx to head or neck. ? ?No herbal supplements. No Biotin use. No recent steroids use.  ? ?Patient also has a history of migraine without aura - on Topamax, GERD. Also, per  review of endocrinology note from 05/2021: ?1. T1DM dx at 80 months old, reportedly undetectable cpeptide with prior study: ?? Hx minor background retinopathy w distant hx of laser tx. No recent DR reported, 11/2016. ?? No microalbuminuria, 08/2018  ?? No sig peripheral neuropathy, normal monofilament 05/2021 ?? hx of L shoulder superficial chest pain attributed to neuropathy managed on lyrica & elavil (started by [facility] pain clinic, was also evaluated by [facility] Neurology at the time), subsequently changed to neurontin and elavil.  ?? Hx of gastroparesis sx (did not have gastric emptying study) which previously responded to reglan x 10 days, has not required since. ?2. Hypothyroidism ?3. OSA w cpap ?4. Asthma ?5. HTN ?6. HLD ?7. Hx intermittent L shoulder chest pain s/p several normal stress tests, including nuclear 2014 time frame --> attributed to neuropathy (see above) ?8. Vestibular migraines w intermittent vertigo previously tx w trokendi ?9. Adhesive capsulitis 05/2019 ?10. Celiac disease dx 2021 ? ?ROS: ?Constitutional: no weight gain, no weight loss, + fatigue, no subjective hyperthermia, no subjective hypothermia, no nocturia ?Eyes: no blurry vision, no xerophthalmia ?ENT: no sore throat, no nodules palpated in neck, no dysphagia, no odynophagia, no hoarseness, no tinnitus, no hypoacusis ?Cardiovascular: no CP, no SOB, no palpitations, no leg swelling ?Respiratory: no cough, no SOB, no wheezing ?Gastrointestinal: no N, no V, + D (occasional, celiac disease), no C, no acid reflux ?Musculoskeletal: + Occasional muscle aches (after exercise), no joint aches ?Skin: no rash, no hair loss ?Neurological: no tremors, + numbness and tingling in feet/+ HAs (vestibular migraines) ?Psychiatric: no depression, no anxiety ? ?Past Medical History:  ?Diagnosis Date  ? Asthma   ? Cancer [facility])   ? appendiceal  ? Chicken pox   ? GERD (gastroesophageal reflux disease)   ? Heart murmur   ? Hx of oral aphthous ulcers   ?  Hypothyroidism   ? Migraines   ? Sleep apnea   ? Type 1 diabetes mellitus ([place])   ? ?Past Surgical History:  ?Procedure Laterality Date  ? APPENDECTOMY  2012  ? COLONOSCOPY WITH PROPOFOL N/A 10/15/2019  ? Procedure: COLONOSCOPY WITH PROPOFOL;  Surgeon: Jonathon Bellows, MD;  Location[facility] ENDOSCOPY;  Service: Gastroenterology;  Laterality: N/A;  ? ESOPHAGOGASTRODUODENOSCOPY (EGD) WITH PROPOFOL N/A 10/15/2019  ? Procedure: ESOPHAGOGASTRODUODENOSCOPY (EGD) WITH PROPOFOL;  Surgeon: Jonathon Bellows, MD;  Location[facility] ENDOSCOPY;  Service: Gastroenterology;  Laterality: N/A;  ? TONSILLECTOMY AND ADENOIDECTOMY  2007  ? UPPER GI ENDOSCOPY    ? ?Social History  ? ?Socioeconomic History  ? Marital status: Married  ?  Spouse name: Not on file  ? Number of children: 2  ? Years of education: Not on file  ? Highest education level: Not on file  ?Occupational History  ?  Not on file  ?Tobacco Use  ? Smoking status: Never  ? Smokeless tobacco: Never  ?Vaping Use  ? Vaping Use: Never used  ?Substance and Sexual Activity  ? Alcohol use: No  ?  Alcohol/week: 0.0 standard drinks  ? Drug use: No  ? Sexual activity: Not on file  ?Other Topics Concern  ? Not on file  ?Social History Narrative  ? Not on file  ? ?Social Determinants of Health  ? ?Financial Resource Strain: Not on file  ?Food Insecurity: Not on file  ?Transportation Needs: Not on file  ?Physical Activity: Not on file  ?Stress: Not on file  ?Social Connections: Not on file  ?Intimate Partner Violence: Not on file  ? ?Current Outpatient Medications on File Prior to Visit  ?Medication Sig Dispense Refill  ? albuterol (VENTOLIN HFA) 108 (90 Base) MCG/ACT inhaler Inhale 2 puffs into the lungs every 6 (six) hours as needed for wheezing or shortness of breath. 18 g 0  ? amitriptyline (ELAVIL) 25 MG tablet TAKE 1 TABLET BY MOUTH AT BEDTIME. 90 tablet 1  ? atorvastatin (LIPITOR) 20 MG tablet Take 1 tablet (20 mg total) by mouth daily. 90 tablet 1  ? Blood Glucose Monitoring Suppl ([place]) w/Device KIT See admin instructions.    ? cefdinir (OMNICEF) 300 MG capsule Take 1 capsule (300 mg total) by mouth 2 (two) times daily. 14 capsule 0  ? CONTOUR NEXT TEST test strip   1  ? folic acid

## 2021-11-03 NOTE — Telephone Encounter (Signed)
Please place future orders for lab appt.  Pt has lab appt tomorrow morning at 8am. ?

## 2021-11-04 ENCOUNTER — Other Ambulatory Visit (INDEPENDENT_AMBULATORY_CARE_PROVIDER_SITE_OTHER): Payer: 59

## 2021-11-04 DIAGNOSIS — E039 Hypothyroidism, unspecified: Secondary | ICD-10-CM | POA: Diagnosis not present

## 2021-11-04 DIAGNOSIS — E7800 Pure hypercholesterolemia, unspecified: Secondary | ICD-10-CM | POA: Diagnosis not present

## 2021-11-04 DIAGNOSIS — E1042 Type 1 diabetes mellitus with diabetic polyneuropathy: Secondary | ICD-10-CM | POA: Diagnosis not present

## 2021-11-04 LAB — HEPATIC FUNCTION PANEL
ALT: 37 U/L (ref 0–53)
AST: 24 U/L (ref 0–37)
Albumin: 4.1 g/dL (ref 3.5–5.2)
Alkaline Phosphatase: 102 U/L (ref 39–117)
Bilirubin, Direct: 0.1 mg/dL (ref 0.0–0.3)
Total Bilirubin: 0.6 mg/dL (ref 0.2–1.2)
Total Protein: 6.5 g/dL (ref 6.0–8.3)

## 2021-11-04 LAB — BASIC METABOLIC PANEL
BUN: 16 mg/dL (ref 6–23)
CO2: 25 mEq/L (ref 19–32)
Calcium: 9.4 mg/dL (ref 8.4–10.5)
Chloride: 108 mEq/L (ref 96–112)
Creatinine, Ser: 1.07 mg/dL (ref 0.40–1.50)
GFR: 84.06 mL/min (ref >60.00)
Glucose, Bld: 63 mg/dL — ABNORMAL LOW (ref 70–99)
Potassium: 4.3 mEq/L (ref 3.5–5.1)
Sodium: 142 mEq/L (ref 135–145)

## 2021-11-04 LAB — LIPID PANEL
Cholesterol: 125 mg/dL (ref 0–200)
HDL: 33.2 mg/dL — ABNORMAL LOW (ref >39.00)
LDL Cholesterol: 57 mg/dL (ref 0–99)
NonHDL: 91.96
Total CHOL/HDL Ratio: 4
Triglycerides: 175 mg/dL — ABNORMAL HIGH (ref 0.0–149.0)
VLDL: 35 mg/dL (ref 0.0–40.0)

## 2021-11-04 LAB — TSH: TSH: 0.14 u[IU]/mL — ABNORMAL LOW (ref 0.35–5.50)

## 2021-11-04 NOTE — Telephone Encounter (Signed)
Orders placed for labs

## 2021-11-04 NOTE — Addendum Note (Signed)
Addended by: Lars Masson on: 11/04/2021 07:18 AM ? ? Modules accepted: Orders ? ?

## 2021-11-05 ENCOUNTER — Ambulatory Visit: Payer: 59 | Admitting: Dermatology

## 2021-11-05 DIAGNOSIS — L719 Rosacea, unspecified: Secondary | ICD-10-CM | POA: Diagnosis not present

## 2021-11-05 DIAGNOSIS — L821 Other seborrheic keratosis: Secondary | ICD-10-CM

## 2021-11-05 DIAGNOSIS — L578 Other skin changes due to chronic exposure to nonionizing radiation: Secondary | ICD-10-CM | POA: Diagnosis not present

## 2021-11-05 DIAGNOSIS — L738 Other specified follicular disorders: Secondary | ICD-10-CM

## 2021-11-05 NOTE — Progress Notes (Signed)
? ?  New Patient Visit ? ?Subjective  ?Eric Hayden is a 46 y.o. male who presents for the following: Other (New patient - Spots of right face and right arm ). ?The patient has spots, moles and lesions to be evaluated, some may be new or changing and the patient has concerns that these could be cancer. ? ?The following portions of the chart were reviewed this encounter and updated as appropriate:  ? Tobacco  Allergies  Meds  Problems  Med Hx  Surg Hx  Fam Hx   ?  ?Review of Systems:  No other skin or systemic complaints except as noted in HPI or Assessment and Plan. ? ?Objective  ?Well appearing patient in no apparent distress; mood and affect are within normal limits. ? ?A focused examination was performed including face, arms. Relevant physical exam findings are noted in the Assessment and Plan. ? ?Head - Anterior (Face) ?Erythema and dilated blood vessels ? ?Arms ?Stuck-on, waxy, tan-brown papule or plaque --Discussed benign etiology and prognosis.  ? ?Right cheek ?Yellow papule ? ? ?Assessment & Plan  ?Rosacea ?Head - Anterior (Face) ? ?Rosacea is a chronic progressive skin condition usually affecting the face of adults, causing redness and/or acne bumps. It is treatable but not curable. It sometimes affects the eyes (ocular rosacea) as well. It may respond to topical and/or systemic medication and can flare with stress, sun exposure, alcohol, exercise and some foods.  Daily application of broad spectrum spf 30+ sunscreen to face is recommended to reduce flares. ? ?Discussed the treatment option of BBL/laser.  Typically we recommend 1-3 treatment sessions about 5-8 weeks apart for best results.  The patient's condition may require "maintenance treatments" in the future.  The fee for BBL / laser treatments is $350 per treatment session for the whole face.  A fee can be quoted for other parts of the body. ?Insurance typically does not pay for BBL/laser treatments and therefore the fee is an out-of-pocket  cost. ? ?Seborrheic keratosis ?Arms ?Benign-appearing.  Observation.  Call clinic for new or changing lesions.  Recommend daily use of broad spectrum spf 30+ sunscreen to sun-exposed areas.  ? ?Sebaceous hyperplasia ?Right cheek ?Benign-appearing.  Observation.  Call clinic for new or changing lesions.  Recommend daily use of broad spectrum spf 30+ sunscreen to sun-exposed areas.  ? ?Actinic Damage ?- chronic, secondary to cumulative UV radiation exposure/sun exposure over time ?- diffuse scaly erythematous macules with underlying dyspigmentation ?- Recommend daily broad spectrum sunscreen SPF 30+ to sun-exposed areas, reapply every 2 hours as needed.  ?- Recommend staying in the shade or wearing long sleeves, sun glasses (UVA+UVB protection) and wide brim hats (4-inch brim around the entire circumference of the hat). ?- Call for new or changing lesions. ? ?Return if symptoms worsen or fail to improve. ? ?I, Ashok Cordia, CMA, am acting as scribe for Sarina Ser, MD . ?Documentation: I have reviewed the above documentation for accuracy and completeness, and I agree with the above. ? ?Sarina Ser, MD ? ?

## 2021-11-05 NOTE — Patient Instructions (Signed)

## 2021-11-06 ENCOUNTER — Encounter: Payer: Self-pay | Admitting: Dermatology

## 2021-11-09 ENCOUNTER — Telehealth: Payer: Self-pay

## 2021-11-09 ENCOUNTER — Telehealth: Payer: Self-pay | Admitting: Internal Medicine

## 2021-11-09 ENCOUNTER — Other Ambulatory Visit: Payer: Self-pay

## 2021-11-09 ENCOUNTER — Ambulatory Visit: Payer: 59 | Admitting: Internal Medicine

## 2021-11-09 DIAGNOSIS — E039 Hypothyroidism, unspecified: Secondary | ICD-10-CM

## 2021-11-09 MED ORDER — LEVOTHYROXINE SODIUM 125 MCG PO TABS: 125.0000 ug | ORAL_TABLET | Freq: Every day | ORAL | 1 refills | Status: DC

## 2021-11-09 NOTE — Telephone Encounter (Signed)
New rx for synthroid sent ?Lab order placed ? ?Lm for pt to cb to sched labs ?

## 2021-11-09 NOTE — Telephone Encounter (Signed)
Levothyroxine 188mg added to chart ?

## 2021-11-09 NOTE — Telephone Encounter (Signed)
11/05/2021  4:50 AM EDT  ? ?Please call Eric Hayden and let him know - spot check on sugar was in the 60s.  Please confirm he is not having issues with low sugars.  Overall cholesterol has improved.  Triglycerides increased slightly, but are relatively stable.  Good cholesterol did decrease.  Exercise will increase good cholesterol. TSH is suppressed.  Confirm he is on synthroid 151mg q day.  If so, then decrease to 1270m q day.  Recheck tsh in 6 weeks.  Kidney function tests and liver function tests are wnl.  ? ?Patient stated he is not having any issues with low blood sugar. He is on synthroid 13716mq day. Patient states that he will need a new Synthroid prescription for the 125 mcg q day.  ? ?Unable to schedule labs in 6 weeks, no lab orders.  ?

## 2021-11-12 ENCOUNTER — Ambulatory Visit
Admission: RE | Admit: 2021-11-12 | Discharge: 2021-11-12 | Disposition: A | Discharge status: Home / Self Care | Payer: 59 | Source: Ambulatory Visit | Attending: Student | Admitting: Student

## 2021-11-12 DIAGNOSIS — J322 Chronic ethmoidal sinusitis: Secondary | ICD-10-CM | POA: Diagnosis not present

## 2021-11-12 DIAGNOSIS — G43819 Other migraine, intractable, without status migrainosus: Secondary | ICD-10-CM | POA: Diagnosis not present

## 2021-11-12 DIAGNOSIS — G43809 Other migraine, not intractable, without status migrainosus: Secondary | ICD-10-CM

## 2021-11-12 MED ORDER — GADOBENATE DIMEGLUMINE 529 MG/ML IV SOLN
15.0000 mL | Freq: Once | INTRAVENOUS | Status: AC | PRN
  Administered 2021-11-12: 15 mL via INTRAVENOUS

## 2021-11-24 DIAGNOSIS — G4733 Obstructive sleep apnea (adult) (pediatric): Secondary | ICD-10-CM | POA: Diagnosis not present

## 2021-11-28 ENCOUNTER — Encounter: Payer: Self-pay | Admitting: Internal Medicine

## 2021-11-30 ENCOUNTER — Ambulatory Visit: Payer: 59 | Admitting: Internal Medicine

## 2021-11-30 MED ORDER — GABAPENTIN 300 MG PO CAPS: ORAL_CAPSULE | ORAL | 1 refills | Status: DC

## 2021-11-30 MED ORDER — AMITRIPTYLINE HCL 25 MG PO TABS: 25.0000 mg | ORAL_TABLET | Freq: Every day | ORAL | 1 refills | Status: DC

## 2021-11-30 NOTE — Telephone Encounter (Signed)
Are you willing to fill Gabapentin and Amitriptyline patient has switched endocrinologist to [facility] requesting primary fils these medications. ?

## 2021-11-30 NOTE — Telephone Encounter (Signed)
prescriptions for gabapentin and amitriptyline sent to [facility].  My chart message sent.   ?

## 2021-12-24 DIAGNOSIS — G4733 Obstructive sleep apnea (adult) (pediatric): Secondary | ICD-10-CM | POA: Diagnosis not present

## 2021-12-30 ENCOUNTER — Other Ambulatory Visit: Payer: Self-pay | Admitting: Internal Medicine

## 2022-01-04 DIAGNOSIS — R42 Dizziness and giddiness: Secondary | ICD-10-CM | POA: Diagnosis not present

## 2022-01-04 DIAGNOSIS — E1142 Type 2 diabetes mellitus with diabetic polyneuropathy: Secondary | ICD-10-CM | POA: Diagnosis not present

## 2022-01-04 DIAGNOSIS — G44011 Episodic cluster headache, intractable: Secondary | ICD-10-CM | POA: Diagnosis not present

## 2022-01-04 DIAGNOSIS — G4733 Obstructive sleep apnea (adult) (pediatric): Secondary | ICD-10-CM | POA: Diagnosis not present

## 2022-01-04 DIAGNOSIS — G43809 Other migraine, not intractable, without status migrainosus: Secondary | ICD-10-CM | POA: Diagnosis not present

## 2022-01-06 ENCOUNTER — Encounter: Payer: Self-pay | Admitting: Internal Medicine

## 2022-01-12 ENCOUNTER — Telehealth: Payer: Self-pay

## 2022-01-12 NOTE — Telephone Encounter (Signed)
Inbound fax from DME supplier requesting form be completed and faxed with clinical notes. DME supplies ordered via [employer] through online portal.

## 2022-01-14 ENCOUNTER — Ambulatory Visit: Payer: 59 | Admitting: Internal Medicine

## 2022-01-20 DIAGNOSIS — E1010 Type 1 diabetes mellitus with ketoacidosis without coma: Secondary | ICD-10-CM | POA: Diagnosis not present

## 2022-01-20 DIAGNOSIS — Z794 Long term (current) use of insulin: Secondary | ICD-10-CM | POA: Diagnosis not present

## 2022-01-20 DIAGNOSIS — Z9641 Presence of insulin pump (external) (internal): Secondary | ICD-10-CM | POA: Diagnosis not present

## 2022-01-22 DIAGNOSIS — Z9641 Presence of insulin pump (external) (internal): Secondary | ICD-10-CM | POA: Diagnosis not present

## 2022-01-22 DIAGNOSIS — E1010 Type 1 diabetes mellitus with ketoacidosis without coma: Secondary | ICD-10-CM | POA: Diagnosis not present

## 2022-01-22 DIAGNOSIS — Z794 Long term (current) use of insulin: Secondary | ICD-10-CM | POA: Diagnosis not present

## 2022-01-24 DIAGNOSIS — G4733 Obstructive sleep apnea (adult) (pediatric): Secondary | ICD-10-CM | POA: Diagnosis not present

## 2022-02-04 ENCOUNTER — Ambulatory Visit (INDEPENDENT_AMBULATORY_CARE_PROVIDER_SITE_OTHER): Payer: 59 | Admitting: Internal Medicine

## 2022-02-04 ENCOUNTER — Encounter: Payer: Self-pay | Admitting: Internal Medicine

## 2022-02-04 VITALS — BP 130/86 | HR 95 | Temp 98.6°F | Resp 18 | Ht 67.0 in | Wt 194.6 lb

## 2022-02-04 DIAGNOSIS — R051 Acute cough: Secondary | ICD-10-CM

## 2022-02-04 DIAGNOSIS — E7800 Pure hypercholesterolemia, unspecified: Secondary | ICD-10-CM | POA: Diagnosis not present

## 2022-02-04 DIAGNOSIS — K219 Gastro-esophageal reflux disease without esophagitis: Secondary | ICD-10-CM | POA: Diagnosis not present

## 2022-02-04 DIAGNOSIS — R519 Headache, unspecified: Secondary | ICD-10-CM | POA: Diagnosis not present

## 2022-02-04 DIAGNOSIS — E1040 Type 1 diabetes mellitus with diabetic neuropathy, unspecified: Secondary | ICD-10-CM

## 2022-02-04 DIAGNOSIS — E1042 Type 1 diabetes mellitus with diabetic polyneuropathy: Secondary | ICD-10-CM

## 2022-02-04 DIAGNOSIS — E039 Hypothyroidism, unspecified: Secondary | ICD-10-CM

## 2022-02-04 DIAGNOSIS — G629 Polyneuropathy, unspecified: Secondary | ICD-10-CM

## 2022-02-04 LAB — HM DIABETES FOOT EXAM

## 2022-02-04 MED ORDER — CEFDINIR 300 MG PO CAPS: 300.0000 mg | ORAL_CAPSULE | Freq: Two times a day (BID) | ORAL | 0 refills | Status: DC

## 2022-02-04 MED ORDER — ALBUTEROL SULFATE HFA 108 (90 BASE) MCG/ACT IN AERS: 2.0000 | INHALATION_SPRAY | Freq: Four times a day (QID) | RESPIRATORY_TRACT | 0 refills | Status: DC | PRN

## 2022-02-04 NOTE — Progress Notes (Signed)
Patient ID: Eric Hayden, male   DOB: 1976-03-11, 46 y.o.   MRN: [id number]   Subjective:    Patient ID: Eric Hayden, male    DOB: 04/19/1976, 46 y.o.   MRN: [id number]   Patient here for a scheduled follow up.   Chief Complaint  Patient presents with   Cough   .   HPI Scheduled for f/u of his diabetes, thyroid and headaches.  Is having increased cough and congestion.  Went to [employer] recently.  When returned home, had what he thought was allergies.  Symptoms started progressing - thought allergy/sinus.  Over the last couple of days, symptoms increased.  Some increased nasal/sinus congestion - ear fullness.  Minimal irritated throat.  Drainage.  Has moved into chest.  Increased cough and congestion.  Some productive colored mucus.  Can hear some wheezing.  Some increased coughing episodes.  No vomiting. Sugars elevated.  No increased chest pain or sob.  States otherwise has been doing relatively well. Seeing neurology for persistent headaches.  Just recently started candesartan.  Waiting to start botox - in process of getting authorized.    Past Medical History:  Diagnosis Date   Asthma    Cancer ([place])    appendiceal   Chicken pox    GERD (gastroesophageal reflux disease)    Heart murmur    Hx of oral aphthous ulcers    Hypothyroidism    Migraines    Sleep apnea    Type 1 diabetes mellitus ([place])    Past Surgical History:  Procedure Laterality Date   APPENDECTOMY  2012   COLONOSCOPY WITH PROPOFOL N/A 10/15/2019   Procedure: COLONOSCOPY WITH PROPOFOL;  Surgeon: Jonathon Bellows, MD;  Location: [facility] ENDOSCOPY;  Service: Gastroenterology;  Laterality: N/A;   ESOPHAGOGASTRODUODENOSCOPY (EGD) WITH PROPOFOL N/A 10/15/2019   Procedure: ESOPHAGOGASTRODUODENOSCOPY (EGD) WITH PROPOFOL;  Surgeon: Jonathon Bellows, MD;  Location: [facility] ENDOSCOPY;  Service: Gastroenterology;  Laterality: N/A;   TONSILLECTOMY AND ADENOIDECTOMY  2007   UPPER GI ENDOSCOPY     Family History  Problem Relation Age of Onset    Arthritis Maternal Grandmother    Hyperlipidemia Maternal Grandmother    Heart disease Maternal Grandmother    Hypertension Maternal Grandmother    Diabetes Maternal Grandmother        type 2   Heart attack Maternal Grandmother    Arthritis Maternal Grandfather    Heart disease Maternal Grandfather    Hypertension Maternal Grandfather    Heart attack Maternal Grandfather    Arthritis Mother    Hyperlipidemia Mother    Hypertension Mother    Diabetes Mother        type 2   Hyperlipidemia Father    Arthritis Paternal Grandmother    Arthritis Paternal Grandfather    Heart attack Paternal Aunt    Social History   Socioeconomic History   Marital status: Married    Spouse name: Not on file   Number of children: 2   Years of education: Not on file   Highest education level: Not on file  Occupational History   Occupation: Theme park manager, [employer]  Tobacco Use   Smoking status: Never   Smokeless tobacco: Never  Vaping Use   Vaping Use: Never used  Substance and Sexual Activity   Alcohol use: No   Drug use: No   Sexual activity: Not on file  Other Topics Concern   Not on file  Social History Narrative   Not on file   Social  Determinants of Health   Financial Resource Strain: Not on file  Food Insecurity: Not on file  Transportation Needs: Not on file  Physical Activity: Not on file  Stress: Not on file  Social Connections: Not on file     Review of Systems  Constitutional:  Negative for fever.       Some decreased appetite.  Is eating.   HENT:  Positive for congestion, postnasal drip and sinus pressure.   Respiratory:  Positive for cough and wheezing. Negative for chest tightness.   Cardiovascular:  Negative for chest pain, palpitations and leg swelling.  Gastrointestinal:  Negative for abdominal pain, diarrhea and vomiting.  Genitourinary:  Negative for difficulty urinating and dysuria.  Musculoskeletal:  Negative for joint swelling and myalgias.   Skin:  Negative for color change and rash.  Neurological:  Negative for dizziness.       Persistent headaches.    Psychiatric/Behavioral:  Negative for agitation and dysphoric mood.        Objective:     BP 130/86 (BP Location: Left Arm, Patient Position: Sitting, Cuff Size: Large)   Pulse 95   Temp 98.6 F (37 C) (Temporal)   Resp 18   Ht _0  (1.702 m)   Wt 194 lb 9.6 oz (88.3 kg)   SpO2 98%   BMI 30.48 kg/m  Wt Readings from Last 3 Encounters:  02/04/22 194 lb 9.6 oz (88.3 kg)  11/03/21 195 lb 3.2 oz (88.5 kg)  07/07/21 186 lb 8 oz (84.6 kg)    Physical Exam Constitutional:      General: He is not in acute distress.    Appearance: Normal appearance. He is well-developed.  HENT:     Head: Normocephalic and atraumatic.     Right Ear: External ear normal.     Left Ear: External ear normal.  Eyes:     General: No scleral icterus.       Right eye: No discharge.        Left eye: No discharge.  Cardiovascular:     Rate and Rhythm: Normal rate and regular rhythm.  Pulmonary:     Effort: Pulmonary effort is normal. No respiratory distress.     Comments: Increased air movement.  No increased wheezing on exam.   Abdominal:     General: Bowel sounds are normal.     Palpations: Abdomen is soft.     Tenderness: There is no abdominal tenderness.  Musculoskeletal:        General: No swelling or tenderness.     Cervical back: Neck supple. No tenderness.  Lymphadenopathy:     Cervical: No cervical adenopathy.  Skin:    Findings: No erythema or rash.  Neurological:     Mental Status: He is alert.  Psychiatric:        Mood and Affect: Mood normal.        Behavior: Behavior normal.      Outpatient Encounter Medications as of 02/04/2022  Medication Sig   amitriptyline (ELAVIL) 25 MG tablet Take 1 tablet (25 mg total) by mouth at bedtime.   atorvastatin (LIPITOR) 20 MG tablet TAKE 1 TABLET BY MOUTH DAILY   Blood Glucose Monitoring Suppl ([place])  w/Device KIT See admin instructions.   candesartan (ATACAND) 4 MG tablet Take 4 mg by mouth daily.   cefdinir (OMNICEF) 300 MG capsule Take 1 capsule (300 mg total) by mouth 2 (two) times daily.   Cholecalciferol (VITAMIN D3 PO) Take by mouth.  CONTOUR NEXT TEST test strip    cyproheptadine (PERIACTIN) 4 MG tablet Take 4 mg by mouth 2 (two) times daily.   folic acid (FOLVITE) 974 MCG tablet Take by mouth.   gabapentin (NEURONTIN) 300 MG capsule Take one tablet 2x/day   Insulin Aspart, w/Niacinamide, (FIASP) 100 UNIT/ML SOLN Use up to 130 units per day in the insulin pump   levothyroxine (SYNTHROID) 125 MCG tablet Take 1 tablet (125 mcg total) by mouth daily before breakfast.   omeprazole (PRILOSEC) 20 MG capsule TAKE 1 CAPSULE EVERY DAY   Probiotic Product (PROBIOTIC PO) Take by mouth.   topiramate (TOPAMAX) 25 MG tablet TAKE 1 TO 2 TABLETS BY MOUTH EVERY DAY   [DISCONTINUED] albuterol (VENTOLIN HFA) 108 (90 Base) MCG/ACT inhaler Inhale 2 puffs into the lungs every 6 (six) hours as needed for wheezing or shortness of breath.   albuterol (VENTOLIN HFA) 108 (90 Base) MCG/ACT inhaler Inhale 2 puffs into the lungs every 6 (six) hours as needed for wheezing or shortness of breath.   [DISCONTINUED] cefdinir (OMNICEF) 300 MG capsule Take 1 capsule (300 mg total) by mouth 2 (two) times daily.   [DISCONTINUED] insulin aspart (NOVOLOG) 100 UNIT/ML injection USE UP TO 150 UNITS DAILY (Patient not taking: Reported on 02/04/2022)   [DISCONTINUED] ketorolac (TORADOL) 10 MG tablet Take 10 mg by mouth once as needed.   [DISCONTINUED] naratriptan (AMERGE) 2.5 MG tablet Take 2.5 mg by mouth 2 (two) times daily as needed. (Patient not taking: Reported on 02/04/2022)   [DISCONTINUED] promethazine (PHENERGAN) 12.5 MG tablet Take 12.5 mg by mouth every 6 (six) hours as needed. (Patient not taking: Reported on 02/04/2022)   [DISCONTINUED] ranitidine (ZANTAC) 150 MG tablet Take 150 mg by mouth at bedtime. (Patient not  taking: Reported on 02/04/2022)   [DISCONTINUED] Rimegepant Sulfate (NURTEC) 75 MG TBDP Take by mouth. (Patient not taking: Reported on 02/04/2022)   No facility-administered encounter medications on file as of 02/04/2022.     Lab Results  Component Value Date   WBC 9.5 07/07/2021   HGB 14.7 07/07/2021   HCT 44.5 07/07/2021   PLT 233.0 07/07/2021   GLUCOSE 63 (L) 11/04/2021   CHOL 125 11/04/2021   TRIG 175.0 (H) 11/04/2021   HDL 33.20 (L) 11/04/2021   LDLCALC 57 11/04/2021   ALT 37 11/04/2021   AST 24 11/04/2021   NA 142 11/04/2021   K 4.3 11/04/2021   CL 108 11/04/2021   CREATININE 1.07 11/04/2021   BUN 16 11/04/2021   CO2 25 11/04/2021   TSH 0.14 (L) 11/04/2021   PSA 1.28 03/11/2021   HGBA1C 6.7 (A) 11/03/2021   MICROALBUR 1.8 03/11/2021    MR BRAIN W WO CONTRAST  Result Date: 11/12/2021 CLINICAL DATA:  Vestibular migraine. EXAM: MRI HEAD WITHOUT AND WITH CONTRAST TECHNIQUE: Multiplanar, multiecho pulse sequences of the brain and surrounding structures were obtained without and with intravenous contrast. CONTRAST:  78m MULTIHANCE GADOBENATE DIMEGLUMINE 529 MG/ML IV SOLN COMPARISON:  Brain MRI 06/03/2017. FINDINGS: Brain: No age advanced or lobar predominant parenchymal atrophy. 8 mm pineal cyst without suspicious masslike or nodular enhancement, unchanged from the prior brain MRI of 06/03/2017. Cavum septum pellucidum and cavum vergae. No cortical encephalomalacia is identified. No significant cerebral white matter disease. There is no acute infarct. No evidence of an intracranial mass. No chronic intracranial blood products. No extra-axial fluid collection. No midline shift. No pathologic intracranial enhancement identified. Vascular: Maintained flow voids within the proximal large arterial vessels. Skull and upper cervical spine: No  focal suspicious marrow lesion. Sinuses/Orbits: Visualized orbits show no acute finding. Mild mucosal thickening scattered within the bilateral ethmoid  air cells. IMPRESSION: No evidence of acute intracranial abnormality 8 mm pineal cyst without suspicious masslike or nodular enhancement, unchanged from the prior brain MRI of 06/03/2017. Otherwise unremarkable MRI appearance the brain. Mild mucosal thickening within the bilateral ethmoid sinuses. Electronically Signed   By: Kellie Simmering D.O.   On: 11/12/2021 12:43       Assessment & Plan:   Problem List Items Addressed This Visit     Cough    Increased cough and congestion as outlined.  Appears to be c/w sinus/URI.  Increased air movement on exam.  Will hold on prednisone.  Hold on cxr.  Treat with omnicef as directed.  Saline nasal spray and steroid nasal spray as directed.  Robitussin DM.  Albuterol inhaler as needed.  covid swab obtained.  Follow.  Call with update.       Relevant Orders   Novel Coronavirus, NAA ([facility])   DM type 1 with diabetic peripheral neuropathy ([facility]CC) - Primary   Relevant Medications   candesartan (ATACAND) 4 MG tablet   GERD (gastroesophageal reflux disease)    Continue prilosec.        Relevant Medications   Probiotic Product (PROBIOTIC PO)   Headache    Persistent issue with increased headache as outlined.  Has tried various medications.  Seeing neurology. Notes reviewed.  Just started candesartan.  Waiting for authorization to begin botox.        Hypercholesterolemia    On lipitor. Low cholesterol diet and exercise.  Follow lipid panel and liver function tests.        Relevant Medications   candesartan (ATACAND) 4 MG tablet   Hypothyroidism    On thyroid replacement.  Follow tsh.       Neuropathy    Continues on gabapentin and amitriptyline.  Stable.       Type 1 diabetes mellitus (HCC)    Seeing Dr Cruzita Lederer now.  Last a1c 6.8.  Sugars elevated with recent infection.  Adjusting insuline.  Follow.        Relevant Medications   candesartan (ATACAND) 4 MG tablet     Einar Pheasant, MD

## 2022-02-04 NOTE — Patient Instructions (Signed)
Saline nasal spray - flush nose two times per day  Nasacort nasal spray - 2 sprays each nostril one time per day.  Do this in the evening.   Robitussin DM as directed for cough and congestion.

## 2022-02-05 ENCOUNTER — Encounter: Payer: Self-pay | Admitting: Internal Medicine

## 2022-02-05 LAB — NOVEL CORONAVIRUS, NAA: SARS-CoV-2, NAA: NOT DETECTED

## 2022-02-05 NOTE — Assessment & Plan Note (Signed)
On lipitor.  Low cholesterol diet and exercise.  Follow lipid panel and liver function tests.   

## 2022-02-05 NOTE — Assessment & Plan Note (Signed)
Persistent issue with increased headache as outlined.  Has tried various medications.  Seeing neurology. Notes reviewed.  Just started candesartan.  Waiting for authorization to begin botox.

## 2022-02-05 NOTE — Assessment & Plan Note (Signed)
On thyroid replacement.  Follow tsh.  

## 2022-02-05 NOTE — Assessment & Plan Note (Signed)
Increased cough and congestion as outlined.  Appears to be c/w sinus/URI.  Increased air movement on exam.  Will hold on prednisone.  Hold on cxr.  Treat with omnicef as directed.  Saline nasal spray and steroid nasal spray as directed.  Robitussin DM.  Albuterol inhaler as needed.  covid swab obtained.  Follow.  Call with update.

## 2022-02-05 NOTE — Assessment & Plan Note (Signed)
Continues on gabapentin and amitriptyline.  Stable.

## 2022-02-05 NOTE — Assessment & Plan Note (Signed)
Seeing Dr Cruzita Lederer now.  Last a1c 6.8.  Sugars elevated with recent infection.  Adjusting insuline.  Follow.

## 2022-02-05 NOTE — Assessment & Plan Note (Signed)
Continue prilosec

## 2022-02-23 DIAGNOSIS — G4733 Obstructive sleep apnea (adult) (pediatric): Secondary | ICD-10-CM | POA: Diagnosis not present

## 2022-03-01 ENCOUNTER — Ambulatory Visit: Payer: 59 | Admitting: Internal Medicine

## 2022-03-01 ENCOUNTER — Encounter: Payer: Self-pay | Admitting: Internal Medicine

## 2022-03-01 VITALS — BP 116/78 | HR 97 | Ht 67.0 in | Wt 197.6 lb

## 2022-03-01 DIAGNOSIS — E1042 Type 1 diabetes mellitus with diabetic polyneuropathy: Secondary | ICD-10-CM | POA: Diagnosis not present

## 2022-03-01 DIAGNOSIS — E039 Hypothyroidism, unspecified: Secondary | ICD-10-CM | POA: Diagnosis not present

## 2022-03-01 DIAGNOSIS — E782 Mixed hyperlipidemia: Secondary | ICD-10-CM

## 2022-03-01 LAB — POCT GLYCOSYLATED HEMOGLOBIN (HGB A1C): Hemoglobin A1C: 6.8 % — AB (ref 4.0–5.6)

## 2022-03-01 LAB — TSH: TSH: 0.27 u[IU]/mL — ABNORMAL LOW (ref 0.35–5.50)

## 2022-03-01 MED ORDER — DEXCOM G6 RECEIVER DEVI: 1.0000 | Freq: Once | 0 refills | Status: AC

## 2022-03-01 MED ORDER — DEXCOM G6 SENSOR MISC: 1.0000 | 3 refills | Status: AC

## 2022-03-01 MED ORDER — DEXCOM G6 TRANSMITTER MISC: 1.0000 | 3 refills | Status: DC

## 2022-03-01 NOTE — Progress Notes (Addendum)
Patient ID: Eric Hayden, male   DOB: 12-23-1975, 46 y.o.   MRN: [id number]  HPI: Eric Hayden is a 46 y.o.-year-old pleasant male, initially referred by his PCP, Dr. Nicki Reaper, returning for follow-up for DM1, diagnosed at 25 months old -reportedly undetectable C-peptide, fairly well controlled, with complications (peripheral neuropathy, h/o diabetic retinopathy s/p distant laser therapy). He previously saw endocrinology in [facility] (Dr. Elisabeth Most), last office visit in 05/2021.  He had to stop seeing her due to change in insurance.  Last visit with me 4 months ago.  Interim history: No increased urination, blurry vision, nausea, chest pain. He started FiAsp since last visit.  He likes it better and feels that this is controlling his blood sugars more  Reviewed HbA1c levels: Lab Results  Component Value Date   HGBA1C 6.7 (A) 11/03/2021   HGBA1C 7.1 (H) 03/11/2021   HGBA1C 6.8 05/29/2019    Insulin pump:  - started [employer] - out of warranty 04/2021 - but with [facility] technology Per endo note from 05/2021: Currently using MiniMed pump with CGM, and warranty just ran out.  Has had [employer] for awhile, and they have approved his pump supplies via [place] this summer when [employer] stopped approving reservoirs (but they have continued to send everything EXCEPT the reservoirs). He received a letter saying that they cannot send reservoirs b/c they had not received information that he had failed MDIs (when he has been on pump > 32yr).   CGM: - Medtronic   Insulin: - NovoLog >> Fiasp  Supplies: - [employer] - having pbs with them...  Pump settings: - basal rates: 12 am: 1.9 units/h 3 am: 2.0 6 am: 1.6  4 pm: 1.3 6 pm: 1.25 9 pm: 1.9  - ICR: 1:10 - target: 100-140 except 6 am-9 pm: 100-120 - ISF: 40 - Insulin on Board: 3:30 h - bolus wizard: on - changes infusion site: q2 days TDD from basal insulin: 46% (47.6 units) >> 33% (39.7  units) TDD from bolus insulin: 54% (55.6 units) >> 67% (81.6 units) Total daily dose: 121-140  Meter: One Touch Verio >> Contour Link >> AccuChek guide as insurance stopped covering the controlling which connected to his pump  Pt checks his sugars >4 a day with his CGM (with receiver):    Previously:   Lowest sugar was 40>> 42 afterworking in the yard; he has hypoglycemia awareness at 60. Hyperglycemia awareness at 240-250.   He has a glucagon kit at home. No previous hypoglycemia admission.  He has Baqsimi at home. Highest sugar was 321 (technical pb.)>> 300 (pb with the site). No previous DKA admissions.    Pt's meals are: - Breakfast: Cereal, juice, fruit - Lunch: salad, tea, sandwich - Dinner: Pasta, casserole, steak or chicken, vegetables, rice/potatoes, milk - Snacks: 3-4 He plays tennis for exercise.  - no CKD, last BUN/creatinine:  Lab Results  Component Value Date   BUN 16 11/04/2021   BUN 20 07/07/2021   CREATININE 1.07 11/04/2021   CREATININE 0.98 07/07/2021  No ACEI/ ARBs.  - + HL;  last set of lipids: Lab Results  Component Value Date   CHOL 125 11/04/2021   HDL 33.20 (L) 11/04/2021   LDLCALC 57 11/04/2021   TRIG 175.0 (H) 11/04/2021   CHOLHDL 4 11/04/2021  He is on Lipitor 20 mg daily.  - last eye exam was in 2022. [facility] DR [facility]  - + pain, numbness, and tingling in his  feet.  On amitriptyline 25 mg daily at bedtime and gabapentin 600 mg 2x a day - per neurology.  Last foot exam 02/04/2022 He was on Lyrica before >> not covered anymore.   He has family history of DM2 in mother, MGM, etc. DM1 in maternal uncle.  Hypothyroidism: - dx'ed in ~2009 -Managed by PCP  Pt is on levothyroxine 125 mcg daily (increased 09/2021, then decreased 10/2021), taken: - in am - fasting - at least 30 min from b'fast - no calcium - no iron - no multivitamins - no PPIs - not on Biotin  Lab Results  Component Value Date   TSH 0.14 (L)  11/04/2021   TSH 4.650 (H) 09/04/2021   TSH 15.01 (H) 07/07/2021   TSH 2.01 05/04/2021   TSH 0.05 (L) 03/11/2021   TSH 3.50 09/05/2019   TSH 1.40 05/17/2017   TSH 0.73 02/10/2016   TSH 0.44 07/25/2015   TSH 0.59 12/20/2014   TSH 5.11 04/06/2014   + FH in M and F. No FH of thyroid cancer. No h/o radiation tx to head or neck. No herbal supplements. No Biotin use. No recent steroids use.   Patient also has a history of migraine without aura - on Topamax, GERD. Also, per review of endocrinology note from 05/2021: 1. T1DM dx at 5 months old, reportedly undetectable cpeptide with prior study:  Hx minor background retinopathy w distant hx of laser tx. No recent DR reported, 11/2016.  No microalbuminuria, 08/2018   No sig peripheral neuropathy, normal monofilament 05/2021  hx of L shoulder superficial chest pain attributed to neuropathy managed on lyrica & elavil (started by [facility], was also evaluated by [facility], Neurology at the time), subsequently changed to neurontin and elavil.   Hx of gastroparesis sx (did not have gastric emptying study) which previously responded to reglan x 10 days, has not required since. 2. Hypothyroidism 3. OSA w cpap 4. Asthma 5. HTN 6. HLD 7. Hx intermittent L shoulder chest pain s/p several normal stress tests, including nuclear 2014 time frame --> attributed to neuropathy (see above) 8. Vestibular migraines w intermittent vertigo previously tx w trokendi 9. Adhesive capsulitis 05/2019 10. Celiac disease dx 2021  ROS: + see HPI + fatigue + D (occasional, celiac disease) + Occasional muscle aches (after exercise), no joint aches numbness and tingling in feet + HAs (vestibular migraines)  Past Medical History:  Diagnosis Date   Asthma    Cancer ([place])    appendiceal   Chicken pox    GERD (gastroesophageal reflux disease)    Heart murmur    Hx of oral aphthous ulcers    Hypothyroidism    Migraines    Sleep apnea    Type 1 diabetes  mellitus ([place])    Past Surgical History:  Procedure Laterality Date   APPENDECTOMY  2012   COLONOSCOPY WITH PROPOFOL N/A 10/15/2019   Procedure: COLONOSCOPY WITH PROPOFOL;  Surgeon: Jonathon Bellows, MD;  Location: [facility] ENDOSCOPY;  Service: Gastroenterology;  Laterality: N/A;   ESOPHAGOGASTRODUODENOSCOPY (EGD) WITH PROPOFOL N/A 10/15/2019   Procedure: ESOPHAGOGASTRODUODENOSCOPY (EGD) WITH PROPOFOL;  Surgeon: Jonathon Bellows, MD;  Location: [facility] ENDOSCOPY;  Service: Gastroenterology;  Laterality: N/A;   TONSILLECTOMY AND ADENOIDECTOMY  2007   UPPER GI ENDOSCOPY     Social History   Socioeconomic History   Marital status: Married    Spouse name: Not on file   Number of children: 2   Years of education: Not on file   Highest education level: Not  on file  Occupational History   Occupation: Theme park manager, [employer]  Tobacco Use   Smoking status: Never   Smokeless tobacco: Never  Vaping Use   Vaping Use: Never used  Substance and Sexual Activity   Alcohol use: No   Drug use: No   Sexual activity: Not on file  Other Topics Concern   Not on file  Social History Narrative   Not on file   Social Determinants of Health   Financial Resource Strain: Not on file  Food Insecurity: Not on file  Transportation Needs: Not on file  Physical Activity: Not on file  Stress: Not on file  Social Connections: Not on file  Intimate Partner Violence: Not on file   Current Outpatient Medications on File Prior to Visit  Medication Sig Dispense Refill   albuterol (VENTOLIN HFA) 108 (90 Base) MCG/ACT inhaler Inhale 2 puffs into the lungs every 6 (six) hours as needed for wheezing or shortness of breath. 18 g 0   amitriptyline (ELAVIL) 25 MG tablet Take 1 tablet (25 mg total) by mouth at bedtime. 90 tablet 1   atorvastatin (LIPITOR) 20 MG tablet TAKE 1 TABLET BY MOUTH DAILY 90 tablet 3   Blood Glucose Monitoring Suppl ([facility]) w/Device KIT See admin instructions.      candesartan (ATACAND) 4 MG tablet Take 4 mg by mouth daily.     cefdinir (OMNICEF) 300 MG capsule Take 1 capsule (300 mg total) by mouth 2 (two) times daily. 20 capsule 0   Cholecalciferol (VITAMIN D3 PO) Take by mouth.     CONTOUR NEXT TEST test strip   1   cyproheptadine (PERIACTIN) 4 MG tablet Take 4 mg by mouth 2 (two) times daily.     folic acid (FOLVITE) 326 MCG tablet Take by mouth.     gabapentin (NEURONTIN) 300 MG capsule Take one tablet 2x/day 180 capsule 1   Insulin Aspart, w/Niacinamide, (FIASP) 100 UNIT/ML SOLN Use up to 130 units per day in the insulin pump 40 mL 11   levothyroxine (SYNTHROID) 125 MCG tablet Take 1 tablet (125 mcg total) by mouth daily before breakfast. 90 tablet 1   omeprazole (PRILOSEC) 20 MG capsule TAKE 1 CAPSULE EVERY DAY 90 capsule 0   Probiotic Product (PROBIOTIC PO) Take by mouth.     topiramate (TOPAMAX) 25 MG tablet TAKE 1 TO 2 TABLETS BY MOUTH EVERY DAY 60 tablet 1   No current facility-administered medications on file prior to visit.   Allergies  Allergen Reactions   Augmentin [Amoxicillin-Pot Clavulanate] Nausea And Vomiting   Levaquin [Levofloxacin In D5w] Swelling    Blurred vision   Family History  Problem Relation Age of Onset   Arthritis Maternal Grandmother    Hyperlipidemia Maternal Grandmother    Heart disease Maternal Grandmother    Hypertension Maternal Grandmother    Diabetes Maternal Grandmother        type 2   Heart attack Maternal Grandmother    Arthritis Maternal Grandfather    Heart disease Maternal Grandfather    Hypertension Maternal Grandfather    Heart attack Maternal Grandfather    Arthritis Mother    Hyperlipidemia Mother    Hypertension Mother    Diabetes Mother        type 2   Hyperlipidemia Father    Arthritis Paternal Grandmother    Arthritis Paternal Grandfather    Heart attack Paternal Aunt    PE: BP 116/78 (BP Location: Left Arm, Patient Position:  Sitting, Cuff Size: Normal)   Pulse 97   Ht 5' 7"  (1.702 m)   Wt 197 lb 9.6 oz (89.6 kg)   SpO2 96%   BMI 30.95 kg/m  Wt Readings from Last 3 Encounters:  03/01/22 197 lb 9.6 oz (89.6 kg)  02/04/22 194 lb 9.6 oz (88.3 kg)  11/03/21 195 lb 3.2 oz (88.5 kg)   Constitutional: overweight, in NAD Eyes: no exophthalmos ENT: moist mucous membranes, no masses palpated in neck, no cervical lymphadenopathy Cardiovascular: Tachycardia, RR, No MRG Respiratory: CTA B Musculoskeletal: no deformities Skin: moist, warm, no rashes Neurological: no tremor with outstretched hands  ASSESSMENT: 1. DM1, fairly well controlled, with complications - Peripheral neuropathy - h/o DR -resolved after laser surgery  2. HL  3. Hypothyroidism  PLAN:  1. Patient with longstanding, fairly well-controlled type 1 diabetes, on Medtronic insulin pump.  HbA1c at last visit was lower, at 6.7%.  At that time, the majority of his blood sugars were quite well controlled, only with occasional hyperglycemic spikes.  Sugars are higher during the night but he mentioned that he was staying up at night and occasionally eating.  He was doing an excellent job changing his infusion site every 2-3 days, calibrating the sensor 2-3 times a day, entering all the blood sugars and carbs into the pump and bolusing for them.  However, he was occasionally calibrating the sensor when the sugars were changing abruptly and we discussed why ideally he would do this when sugars were more stable.  Also, he was not bolusing consistently before meals and mostly starting the boluses after he already started to eat.  Sometimes he waited until blood sugars were already high after meals to bolus for the entire amount of carbs eaten.  We discussed that this is not recommended due to the fact that it allows his blood sugars to go very high right after meals and then to plummet.  I advised him to move the boluses 15 minutes before meals but also suggested Fiasp, which can be injected at the start of the meal.   Also, he wanted to switch his pump to a t:slim X 2+ Dexcom CGM system as his pump was out of warranty and I advised him to check with his insurance to see if these are covered.  We discussed that if he could not obtain this pump, to upgrade to Medtronic 770 G insulin pump (in which case he needed a Contour next Link 2.7 test strips - for which he needed a PA). CGM interpretation: -At today's visit, we reviewed his CGM downloads: It appears that 71% of values are in target range (goal >70%), while 23% are higher than 180 (goal <25%), and 6% are lower than 70 (goal <4%).  The calculated average blood sugar is 145.  -Reviewing the CGM trends, it appears that his sugars are higher at night, and increasing after approximately 1 AM and then starting to improve around 7-8 AM.  The sugars are more variable after dinner, but usually higher and they drop sometimes to 50s or even lower after approximately 11 PM. -In an effort to improve his blood sugars overnight, I did advise him to increase his basal rate from 12 AM to 6 AM, however, as he is dropping his sugars before 12 AM, we decreased his basal rate after 9 PM. -He was able to start Fiasp since last visit and he is now bolusing mostly at the beginning of the meal.  However, sometimes, he is still  bolusing after he already started eating and these are situations in which his sugars initially increase and then they plummet.  We discussed about continuing to work on the timing of his [place], the for now I did not suggest a change in his bolus parameters. -He called his insurance and it appears that the t:slim insulin pump and the Dexcom CGM are covered.  I sent a prescription for the Dexcom CGM to his pharmacy.  We discussed about how to obtain the pump and I suggested to establish an account with a supplier first and have them request the prescription from [place].  I also referred him to our diabetes educator for prepump training session.  After this, he will most likely  need an appointment with a nutritionist to make sure he is counting carbs correctly and also for a pump training.  He agrees with the plan. - I suggested to: Patient Instructions  Please use the following pump settings: - basal rates: 12 am: 1.9 >> 2.0 units/h 3 am: 2.0 >> 2.1 6 am: 1.6 4 pm: 1.3 6 pm: 1.25 9 pm: 1.9 >> 1.8 - ICR: 1:10 - Target: 100-140 except 6 am-9 pm: 100-120 - ISF: 40 - Active Insulin Time: 3:30 h - bolus wizard: on  The most common suppliers for the pumps are: [place] [employer]: [place]: [phone] Ext [phone] [employer]: [place]: [place]: [phone] [place]: [phone]  Please schedule an appt with Leonia Reader for diabetes education.  Please return in 3-4 months.  - we checked his HbA1c: 6.8% (only slightly higher) - advised to check sugars at different times of the day - 4x a day, rotating check times - advised for yearly eye exams >> he is not UTD - he has peripheral neuropathy and his previous endocrinologist was refilling his amitriptyline and Neurontin.  However, he is seeing neurology and at last visit, I suggested to have them refill these, if they agree. - return to clinic in 3-4 months  2.  Hyperlipidemia - Reviewed latest lipid panel from 10/2021: LDL at goal, HDL low, triglycerides high: Lab Results  Component Value Date   CHOL 125 11/04/2021   HDL 33.20 (L) 11/04/2021   LDLCALC 57 11/04/2021   TRIG 175.0 (H) 11/04/2021   CHOLHDL 4 11/04/2021  - Continues Lipitor 20 mg daily  without side effects.  3. Hypothyroidism -This is managed by PCP -TSH was slightly high and levothyroxine was increased to 137 mcg daily before last visit -However latest TSH was suppressed: Lab Results  Component Value Date   TSH 0.14 (L) 11/04/2021  -After the above results returned, LT4 was decreased to 125 mcg daily -He is taking the levothyroxine  correctly  Total time spent for the visit: 40 min, in precharting, reviewing his chart, also reviewing his pump downloads, discussing his hypo- and hyper-glycemic episodes, reviewing previous labs and pump settings and developing a plan to avoid hypo- and hyper-glycemia.    Component     Latest Ref Rng 03/01/2022  TSH     0.35 - 5.50 uIU/mL 0.27 (L)     TSH is actually suppressed.  I am surprised by this result, especially as she had high TSH levels previously on the same dose.  My advice would be to continue the same regimen and get another TSH checked by PCP as planned, but in approximately 1.5 months.  Philemon Kingdom, MD PhD [facility] Endocrinology

## 2022-03-01 NOTE — Patient Instructions (Addendum)
Please use the following pump settings: - basal rates: 12 am: 1.9 >> 2.0 units/h 3 am: 2.0 >> 2.1 6 am: 1.6 4 pm: 1.3 6 pm: 1.25 9 pm: 1.9 >> 1.8 - ICR: 1:10 - Target: 100-140 except 6 am-9 pm: 100-120 - ISF: 40 - Active Insulin Time: 3:30 h - bolus wizard: on  The most common suppliers for the pumps are: [place] [employer]: [place]: [phone] Ext [phone] [employer]: [place]: [place]: [phone] [place]: [phone]  Please schedule an appt with Leonia Reader for diabetes education.  Please return in 3-4 months.

## 2022-03-08 DIAGNOSIS — G43719 Chronic migraine without aura, intractable, without status migrainosus: Secondary | ICD-10-CM | POA: Diagnosis not present

## 2022-03-22 ENCOUNTER — Encounter: Payer: Self-pay | Admitting: Internal Medicine

## 2022-03-24 ENCOUNTER — Telehealth: Payer: Self-pay

## 2022-03-24 NOTE — Telephone Encounter (Signed)
Inbound fax from DME supplier requesting form be completed and faxed with clinical notes. DME supplies ordered via [employer] through online portal.

## 2022-03-26 DIAGNOSIS — G4733 Obstructive sleep apnea (adult) (pediatric): Secondary | ICD-10-CM | POA: Diagnosis not present

## 2022-03-29 NOTE — Telephone Encounter (Signed)
Parachute message from [place]:  Hello, since the patient's pump is still in warranty, we need a narrative letter signed and dated by the doctor. The narrative letter should clearly state the medical necessity of a new pump while the current pump is still in warranty and the current pump's warranty date. We cannot use the Face to Face as the narrative letter. The letter can either be attached via Par[employer]ute or faxed to [phone]. Thank you!

## 2022-03-30 DIAGNOSIS — E1010 Type 1 diabetes mellitus with ketoacidosis without coma: Secondary | ICD-10-CM | POA: Diagnosis not present

## 2022-03-30 DIAGNOSIS — Z9641 Presence of insulin pump (external) (internal): Secondary | ICD-10-CM | POA: Diagnosis not present

## 2022-03-30 DIAGNOSIS — E1042 Type 1 diabetes mellitus with diabetic polyneuropathy: Secondary | ICD-10-CM | POA: Diagnosis not present

## 2022-03-30 DIAGNOSIS — Z794 Long term (current) use of insulin: Secondary | ICD-10-CM | POA: Diagnosis not present

## 2022-03-31 ENCOUNTER — Encounter: Payer: Self-pay | Admitting: Internal Medicine

## 2022-04-08 ENCOUNTER — Ambulatory Visit
Admission: RE | Admit: 2022-04-08 | Discharge: 2022-04-08 | Disposition: A | Discharge status: Home / Self Care | Payer: 59 | Source: Ambulatory Visit | Attending: Emergency Medicine | Admitting: Emergency Medicine

## 2022-04-08 ENCOUNTER — Other Ambulatory Visit: Payer: Self-pay

## 2022-04-08 VITALS — BP 113/74 | HR 78 | Temp 97.9°F | Resp 16

## 2022-04-08 DIAGNOSIS — J0100 Acute maxillary sinusitis, unspecified: Secondary | ICD-10-CM | POA: Diagnosis not present

## 2022-04-08 MED ORDER — DOXYCYCLINE MONOHYDRATE 100 MG PO CAPS: 100.0000 mg | ORAL_CAPSULE | Freq: Two times a day (BID) | ORAL | 0 refills | Status: AC

## 2022-04-08 NOTE — ED Triage Notes (Signed)
Patient c/o nasal congestion x 4 days.   Patient denies fever at home.   Patient endorses throat irritation and sinus pressure.   Patient took an at home COVID test with a negative result on Tuesday.   Patient has taken Tylenol with no relief of symptoms.

## 2022-04-08 NOTE — ED Provider Notes (Signed)
[name]    CSN: [id number] Arrival date & time: 04/08/22  0850      History   Chief Complaint Chief Complaint  Patient presents with   Nasal Congestion    HPI Eric Hayden is a 46 y.o. male.   Patient presents with nasal congestion, rhinorrhea, sinus pain and pressure, sore throat and a productive cough for 4 days.  No known sick contacts but endorses that he was at a large funeral prior to symptoms beginning.  Tolerating food and liquids.  History of asthma, migraines, type 1 diabetes, heart murmur.  Denies shortness of breath, wheezing, fever, chills or body aches.  COVID test negative    Past Medical History:  Diagnosis Date   Asthma    Cancer ([place])    appendiceal   Chicken pox    GERD (gastroesophageal reflux disease)    Heart murmur    Hx of oral aphthous ulcers    Hypothyroidism    Migraines    Sleep apnea    Type 1 diabetes mellitus ([place])     Patient Active Problem List   Diagnosis Date Noted   Mixed hyperlipidemia 03/01/2022   DM type 1 with diabetic peripheral neuropathy ([place]) 11/03/2021   Earache 07/08/2021   Celiac disease 07/07/2021   History of COVID-19 03/02/2021   Altered level of consciousness 05/22/2017   Syncope 05/17/2017   Cough 05/07/2016   Dizziness 04/12/2016   Elevated alkaline phosphatase level 08/17/2015   Health care maintenance 12/09/2014   Chest pain 12/09/2014   Headache 05/20/2014   Abdominal pain 05/20/2014   Back pain 08/19/2013   Type 1 diabetes mellitus ([place]) 11/18/2012   Hypercholesterolemia 11/18/2012   Neuropathy 11/18/2012   Obstructive sleep apnea 11/18/2012   GERD (gastroesophageal reflux disease) 11/18/2012   Hypothyroidism 11/18/2012    Past Surgical History:  Procedure Laterality Date   APPENDECTOMY  2012   COLONOSCOPY WITH PROPOFOL N/A 10/15/2019   Procedure: COLONOSCOPY WITH PROPOFOL;  Surgeon: Jonathon Bellows, MD;  Location[facility] ENDOSCOPY;  Service: Gastroenterology;  Laterality: N/A;    ESOPHAGOGASTRODUODENOSCOPY (EGD) WITH PROPOFOL N/A 10/15/2019   Procedure: ESOPHAGOGASTRODUODENOSCOPY (EGD) WITH PROPOFOL;  Surgeon: Jonathon Bellows, MD;  Location: [facility] ENDOSCOPY;  Service: Gastroenterology;  Laterality: N/A;   TONSILLECTOMY AND ADENOIDECTOMY  2007   UPPER GI ENDOSCOPY         Home Medications    Prior to Admission medications   Medication Sig Start Date End Date Taking? Authorizing Provider  amitriptyline (ELAVIL) 25 MG tablet Take 1 tablet (25 mg total) by mouth at bedtime. 11/30/21  Yes Einar Pheasant, MD  atorvastatin (LIPITOR) 20 MG tablet TAKE 1 TABLET BY MOUTH DAILY 12/30/21  Yes Einar Pheasant, MD  Cholecalciferol (VITAMIN D3 PO) Take by mouth.   Yes [provider]  cyproheptadine (PERIACTIN) 4 MG tablet Take 4 mg by mouth 2 (two) times daily.   Yes [provider]  gabapentin (NEURONTIN) 300 MG capsule Take one tablet 2x/day 11/30/21  Yes Einar Pheasant, MD  Insulin Aspart, w/Niacinamide, (FIASP) 100 UNIT/ML SOLN Use up to 130 units per day in the insulin pump 11/03/21  Yes Philemon Kingdom, MD  levothyroxine (SYNTHROID) 125 MCG tablet Take 1 tablet (125 mcg total) by mouth daily before breakfast. 11/09/21  Yes Einar Pheasant, MD  omeprazole (PRILOSEC) 20 MG capsule TAKE 1 CAPSULE EVERY DAY 10/14/21  Yes Dutch Quint B, FNP  topiramate (TOPAMAX) 25 MG tablet TAKE 1 TO 2 TABLETS BY MOUTH EVERY DAY 04/10/21  Yes Scott,  Charlene, MD  albuterol (VENTOLIN HFA) 108 (90 Base) MCG/ACT inhaler Inhale 2 puffs into the lungs every 6 (six) hours as needed for wheezing or shortness of breath. 02/04/22   Einar Pheasant, MD  Blood Glucose Monitoring Suppl ([place]) w/Device KIT See admin instructions. 12/10/19   [provider]  candesartan (ATACAND) 4 MG tablet Take 4 mg by mouth daily.    [provider]  cefdinir (OMNICEF) 300 MG capsule Take 1 capsule (300 mg total) by mouth 2 (two) times daily. 02/04/22   Einar Pheasant, MD   Continuous Blood Gluc Transmit (DEXCOM G6 TRANSMITTER) MISC 1 Device by Does not apply route every 3 (three) months. 03/01/22   Philemon Kingdom, MD  CONTOUR NEXT TEST test strip  12/12/17   [provider]  folic acid (FOLVITE) 409 MCG tablet Take by mouth.    [provider]  Probiotic Product (PROBIOTIC PO) Take by mouth.    [provider]    Family History Family History  Problem Relation Age of Onset   Arthritis Maternal Grandmother    Hyperlipidemia Maternal Grandmother    Heart disease Maternal Grandmother    Hypertension Maternal Grandmother    Diabetes Maternal Grandmother        type 2   Heart attack Maternal Grandmother    Arthritis Maternal Grandfather    Heart disease Maternal Grandfather    Hypertension Maternal Grandfather    Heart attack Maternal Grandfather    Arthritis Mother    Hyperlipidemia Mother    Hypertension Mother    Diabetes Mother        type 2   Hyperlipidemia Father    Arthritis Paternal Grandmother    Arthritis Paternal Grandfather    Heart attack Paternal Aunt     Social History Social History   Tobacco Use   Smoking status: Never   Smokeless tobacco: Never  Vaping Use   Vaping Use: Never used  Substance Use Topics   Alcohol use: No   Drug use: No     Allergies   Augmentin [amoxicillin-pot clavulanate] and Levaquin [levofloxacin in d5w]   Review of Systems Review of Systems Defer to HPI   Physical Exam Triage Vital Signs ED Triage Vitals  Enc Vitals Group     BP 04/08/22 0907 113/74     Pulse Rate 04/08/22 0907 78     Resp 04/08/22 0907 16     Temp 04/08/22 0907 97.9 F (36.6 C)     Temp Source 04/08/22 0907 Oral     SpO2 04/08/22 0907 95 %     Weight --      Height --      Head Circumference --      Peak Flow --      Pain Score 04/08/22 0905 5     Pain Loc --      Pain Edu? --      Excl. in [place]? --    No data found.  Updated Vital Signs BP 113/74 (BP Location: Left Arm)   Pulse  78   Temp 97.9 F (36.6 C) (Oral)   Resp 16   SpO2 95%   Visual Acuity Right Eye Distance:   Left Eye Distance:   Bilateral Distance:    Right Eye Near:   Left Eye Near:    Bilateral Near:     Physical Exam Constitutional:      Appearance: He is ill-appearing.  HENT:     Head: Normocephalic.  Right Ear: Tympanic membrane, ear canal and external ear normal.     Left Ear: Tympanic membrane, ear canal and external ear normal.     Nose: Congestion and rhinorrhea present.     Right Turbinates: Swollen.     Left Turbinates: Swollen.     Right Sinus: Maxillary sinus tenderness present.     Left Sinus: Maxillary sinus tenderness present.     Mouth/Throat:     Mouth: Mucous membranes are moist.     Pharynx: Posterior oropharyngeal erythema present.     Tonsils: No tonsillar exudate. 0 on the right. 0 on the left.  Cardiovascular:     Rate and Rhythm: Normal rate and regular rhythm.     Pulses: Normal pulses.     Heart sounds: Normal heart sounds.  Pulmonary:     Effort: Pulmonary effort is normal.     Breath sounds: Normal breath sounds.  Neurological:     Mental Status: He is alert and oriented to person, place, and time.  Psychiatric:        Mood and Affect: Mood normal.        Behavior: Behavior normal.      UC Treatments / Results  Labs (all labs ordered are listed, but only abnormal results are displayed) Labs Reviewed - No data to display  EKG   Radiology No results found.  Procedures Procedures (including critical care time)  Medications Ordered in UC Medications - No data to display  Initial Impression / Assessment and Plan / UC Course  I have reviewed the triage vital signs and the nursing notes.  Pertinent labs & imaging results that were available during my care of the patient were reviewed by me and considered in my medical decision making (see chart for details).  Acute nonrecurrent maxillary sinusitis  While ill-appearing patient is in no  signs of distress nor toxic appearing.  Vital signs are stable.  Low suspicion for pneumonia, pneumothorax or bronchitis and therefore will defer imaging.  Presentation is consistent with sinusitis  Prescribed doxycycline 10-day course based on appearance during exam  May use additional over-the-counter medications as needed for supportive care.  May follow-up with urgent care as needed if symptoms persist or worsen.   Final Clinical Impressions(s) / UC Diagnoses   Final diagnoses:  None   Discharge Instructions   None    ED Prescriptions   None    PDMP not reviewed this encounter.   Hans [place], [place] 04/08/22 [phone]

## 2022-04-08 NOTE — Discharge Instructions (Signed)
Today you are being treated for a sinus infection, generally sinus infections are caused by viruses and we would like to wait at least 10 days before attempting use of antibiotics however you appear to be very ill and uncomfortable during the exam today and therefore we will start antibiotic treatment early  Take doxycycline every morning and every evening for 10 days    You can take Tylenol and/or Ibuprofen as needed for fever reduction and pain relief.   For cough: honey 1/2 to 1 teaspoon (you can dilute the honey in water or another fluid).  You can also use guaifenesin and dextromethorphan for cough. You can use a humidifier for chest congestion and cough.  If you don't have a humidifier, you can sit in the bathroom with the hot shower running.      For sore throat: try warm salt water gargles, cepacol lozenges, throat spray, warm tea or water with lemon/honey, popsicles or ice, or OTC cold relief medicine for throat discomfort.   For congestion: take a daily anti-histamine like Zyrtec, Claritin, and a oral decongestant, such as pseudoephedrine.  You can also use Flonase 1-2 sprays in each nostril daily.   It is important to stay hydrated: drink plenty of fluids (water, gatorade/powerade/pedialyte, juices, or teas) to keep your throat moisturized and help further relieve irritation/discomfort.

## 2022-04-19 ENCOUNTER — Encounter: Payer: 59 | Attending: Internal Medicine | Admitting: Nutrition

## 2022-04-19 DIAGNOSIS — E1042 Type 1 diabetes mellitus with diabetic polyneuropathy: Secondary | ICD-10-CM | POA: Insufficient documentation

## 2022-04-20 ENCOUNTER — Telehealth: Payer: Self-pay | Admitting: Nutrition

## 2022-04-20 NOTE — Telephone Encounter (Signed)
Patient reported no diffficulty using the pump or wearing it.  Says is very pleased with this pump.  Says blood sugar stayed at 110 all night and FBS today at 7AM was 110.  He had no questions for me at this time.

## 2022-04-20 NOTE — Progress Notes (Signed)
Patient was trained on the use of the Dexcom G6 CGM.  This was linked to his pump and to [employer] and to [employer].   He was also trained on the use of tandem Control IQ insulin pump.  His settings were transferred from his Medtonic 670 pump:  Basal rate: MN: 2.0, 3AM: 2.1, 6AM: 1.6, 4PM: 1.3, 6PM: 1.25, 9PM: 1.8.  I/C: 10, ISF: 40, target: 110, timing: 4 hours.  The pump was put in control IQ, and was linked to T-connect app and to [place].   He filled a cartridge with Fiasp insulin, but was given a vial of Luymjev insulin due to fact that this insulin is only stable in the Tandem pump for 48 hours.  This was explained to the patient, but because he is now using 115u/day, the insulin may be good for 2.5 days.  He will observe that if his blood sugar readings go high after 2 days, to switch to Lymjev insulin and call office for change of prescription.  He redemonstrated how to start/stop pump, give bolus and correction doses, respond to alerts/alarms and we reviewed high blood sugar protocols.  He reported good understanding of all of the above and was given handouts for all of the above topics. .   We discussed all topics on the pump training checklist and he signed this indicating good understanding of this.  He had no final questions.

## 2022-04-20 NOTE — Telephone Encounter (Signed)
Opened in error

## 2022-04-20 NOTE — Patient Instructions (Signed)
REad over pump manuel Call Tandem pump help line if questions about how to use the pump, and why his phone is not able to use the bolus setting Call Dexcom if sensor falls off, or if questions about sensor.

## 2022-04-26 ENCOUNTER — Ambulatory Visit: Payer: 59 | Admitting: Dermatology

## 2022-04-26 DIAGNOSIS — D1801 Hemangioma of skin and subcutaneous tissue: Secondary | ICD-10-CM

## 2022-04-26 DIAGNOSIS — Z1283 Encounter for screening for malignant neoplasm of skin: Secondary | ICD-10-CM | POA: Diagnosis not present

## 2022-04-26 DIAGNOSIS — L814 Other melanin hyperpigmentation: Secondary | ICD-10-CM | POA: Diagnosis not present

## 2022-04-26 DIAGNOSIS — D229 Melanocytic nevi, unspecified: Secondary | ICD-10-CM

## 2022-04-26 DIAGNOSIS — L578 Other skin changes due to chronic exposure to nonionizing radiation: Secondary | ICD-10-CM | POA: Diagnosis not present

## 2022-04-26 DIAGNOSIS — Z808 Family history of malignant neoplasm of other organs or systems: Secondary | ICD-10-CM

## 2022-04-26 DIAGNOSIS — L821 Other seborrheic keratosis: Secondary | ICD-10-CM

## 2022-04-26 DIAGNOSIS — L719 Rosacea, unspecified: Secondary | ICD-10-CM

## 2022-04-26 DIAGNOSIS — G4733 Obstructive sleep apnea (adult) (pediatric): Secondary | ICD-10-CM | POA: Diagnosis not present

## 2022-04-26 NOTE — Progress Notes (Unsigned)
Follow-Up Visit   Subjective  Eric Hayden is a 46 y.o. male who presents for the following: Annual Exam (Mole check). Family hx of skin cancer. The patient presents for Total-Body Skin Exam (TBSE) for skin cancer screening and mole check.  The patient has spots, moles and lesions to be evaluated, some may be new or changing and the patient has concerns that these could be cancer.   The following portions of the chart were reviewed this encounter and updated as appropriate:   Tobacco  Allergies  Meds  Problems  Med Hx  Surg Hx  Fam Hx     Review of Systems:  No other skin or systemic complaints except as noted in HPI or Assessment and Plan.  Objective  Well appearing patient in no apparent distress; mood and affect are within normal limits.  A full examination was performed including scalp, head, eyes, ears, nose, lips, neck, chest, axillae, abdomen, back, buttocks, bilateral upper extremities, bilateral lower extremities, hands, feet, fingers, toes, fingernails, and toenails. All findings within normal limits unless otherwise noted below.  face Mid face erythema with telangiectasias +/- scattered inflammatory papules.    Assessment & Plan  Rosacea face Rosacea is a chronic progressive skin condition usually affecting the face of adults, causing redness and/or acne bumps. It is treatable but not curable. It sometimes affects the eyes (ocular rosacea) as well. It may respond to topical and/or systemic medication and can flare with stress, sun exposure, alcohol, exercise, topical steroids (including hydrocortisone/cortisone 10) and some foods.  Daily application of broad spectrum spf 30+ sunscreen to face is recommended to reduce flares.   Discussed the treatment option of BBL/laser.  Typically we recommend 1-3 treatment sessions about 5-8 weeks apart for best results.  The patient's condition may require "maintenance treatments" in the future.  The fee for BBL / laser treatments  is $350 per treatment session for the whole face.  A fee can be quoted for other parts of the body. Insurance typically does not pay for BBL/laser treatments and therefore the fee is an out-of-pocket cost.   [facility] Oxymetazoline: 1% Ivermectin: 1% Niacinamide: 2% Vehicle: Cream Apply to face once a day   Family history of skin cancer unknown Father hx of skin cancer   Lentigines - Scattered tan macules - Due to sun exposure - Benign-appearing, observe - Recommend daily broad spectrum sunscreen SPF 30+ to sun-exposed areas, reapply every 2 hours as needed. - Call for any changes  Seborrheic Keratoses - Stuck-on, waxy, tan-brown papules and/or plaques  - Benign-appearing - Discussed benign etiology and prognosis. - Observe - Call for any changes  Melanocytic Nevi - Tan-brown and/or pink-flesh-colored symmetric macules and papules - Benign appearing on exam today - Observation - Call clinic for new or changing moles - Recommend daily use of broad spectrum spf 30+ sunscreen to sun-exposed areas.   Hemangiomas - Red papules - Discussed benign nature - Observe - Call for any changes  Actinic Damage - Chronic condition, secondary to cumulative UV/sun exposure - diffuse scaly erythematous macules with underlying dyspigmentation - Recommend daily broad spectrum sunscreen SPF 30+ to sun-exposed areas, reapply every 2 hours as needed.  - Staying in the shade or wearing long sleeves, sun glasses (UVA+UVB protection) and wide brim hats (4-inch brim around the entire circumference of the hat) are also recommended for sun protection.  - Call for new or changing lesions.  Skin cancer screening performed today.   Return in about 1  year (around 04/27/2023) for TBSE, Rosacea, fx hx of skin cancer .  IMarye Round, CMA, am acting as scribe for Sarina Ser, MD .  Documentation: I have reviewed the above documentation for accuracy and completeness, and I agree with  the above.  Sarina Ser, MD

## 2022-04-26 NOTE — Patient Instructions (Addendum)
Instructions for [facility] Medications  One or more of your medications was sent to the [facility] mail order compounding pharmacy. You will receive an email from them and can purchase the medicine through that link. It will then be mailed to your home at the address you confirmed. If for any reason you do not receive an email from them, please check your spam folder. If you still do not find the email, please let us know. [facility] phone number is [phone].       Due to recent changes in healthcare laws, you may see results of your pathology and/or laboratory studies on MyChart before the doctors have had a chance to review them. We understand that in some cases there may be results that are confusing or concerning to you. Please understand that not all results are received at the same time and often the doctors may need to interpret multiple results in order to provide you with the best plan of care or course of treatment. Therefore, we ask that you please give [place] 2 business days to thoroughly review all your results before contacting the office for clarification. Should we see a critical lab result, you will be contacted sooner.   If You Need Anything After Your Visit  If you have any questions or concerns for your doctor, please call our main line at [phone] and press option 4 to reach your doctor's medical assistant. If no one answers, please leave a voicemail as directed and we will return your call as soon as possible. Messages left after 4 pm will be answered the following business day.   You may also send [place] a message via [place]. We typically respond to MyChart messages within 1-2 business days.  For prescription refills, please ask your pharmacy to contact our office. Our fax number is [phone].  If you have an urgent issue when the clinic is closed that cannot wait until the next business day, you can page your doctor at the number below.    Please note  that while we do our best to be available for urgent issues outside of office hours, we are not available 24/7.   If you have an urgent issue and are unable to reach [place], you may choose to seek medical care at your doctor's office, retail clinic, urgent care center, or emergency room.  If you have a medical emergency, please immediately call 911 or go to the emergency department.  Pager Numbers  - Dr. Nehemiah Massed: [phone]  - Dr. Laurence Ferrari: [phone]  - Dr. Nicole Kindred: [phone]  In the event of inclement weather, please call our main line at [phone] for an update on the status of any delays or closures.  Dermatology Medication Tips: Please keep the boxes that topical medications come in in order to help keep track of the instructions about where and how to use these. Pharmacies typically print the medication instructions only on the boxes and not directly on the medication tubes.   If your medication is too expensive, please contact our office at [phone] option 4 or send [place] a message through [place].   We are unable to tell what your co-pay for medications will be in advance as this is different depending on your insurance coverage. However, we may be able to find a substitute medication at lower cost or fill out paperwork to get insurance to cover a needed medication.   If a prior authorization is required to get your medication covered by your insurance  company, please allow [place] 1-2 business days to complete this process.  Drug prices often vary depending on where the prescription is filled and some pharmacies may offer cheaper prices.  The website www.goodrx.com contains coupons for medications through different pharmacies. The prices here do not account for what the cost may be with help from insurance (it may be cheaper with your insurance), but the website can give you the price if you did not use any insurance.  - You can print the associated coupon and take it with your  prescription to the pharmacy.  - You may also stop by our office during regular business hours and pick up a GoodRx coupon card.  - If you need your prescription sent electronically to a different pharmacy, notify our office through [facility] or by phone at [phone] option 4.     Si Usted Necesita Algo Despus de Su Visita  Tambin puede enviarnos un mensaje a travs de Pharmacist, community. Por lo general respondemos a los mensajes de MyChart en el transcurso de 1 a 2 das hbiles.  Para renovar recetas, por favor pida a su farmacia que se ponga en contacto con nuestra oficina. Harland Dingwall de fax es [place] [phone].  Si tiene un asunto urgente cuando la clnica est cerrada y que no puede esperar hasta el siguiente da hbil, puede llamar/localizar a su doctor(a) al nmero que aparece a continuacin.   Por favor, tenga en cuenta que aunque hacemos todo lo posible para estar disponibles para asuntos urgentes fuera del horario de [place], no estamos disponibles las 24 horas del da, los 7 das de la [place].   Si tiene un problema urgente y no puede comunicarse con nosotros, puede optar por buscar atencin mdica  en el consultorio de su doctor(a), en una clnica privada, en un centro de atencin urgente o en una sala de emergencias.  Si tiene Engineering geologist, por favor llame inmediatamente al 911 o vaya a la sala de emergencias.  Nmeros de bper  - Dr. Nehemiah Massed: [phone]  - Dra. Moye: [phone]  - Dra. Nicole Kindred: [phone]  En caso de inclemencias del [place], por favor llame a Johnsie Kindred principal al [phone] para una actualizacin sobre el [place] de cualquier retraso o cierre.  Consejos para la medicacin en dermatologa: Por favor, guarde las cajas en las que vienen los medicamentos de uso tpico para ayudarle a seguir las instrucciones sobre dnde y cmo usarlos. Las farmacias generalmente imprimen las instrucciones del medicamento slo en las cajas y no  directamente en los tubos del [place].   Si su medicamento es muy caro, por favor, pngase en contacto con Zigmund Daniel llamando al [phone] y presione la opcin 4 o envenos un mensaje a travs de Pharmacist, community.   No podemos decirle cul ser su copago por los medicamentos por adelantado ya que esto es diferente dependiendo de la cobertura de su seguro. Sin embargo, es posible que podamos encontrar un medicamento sustituto a Electrical engineer un formulario para que el seguro cubra el medicamento que se considera necesario.   Si se requiere una autorizacin previa para que su compaa de seguros [place] su medicamento, por favor permtanos de 1 a 2 das hbiles para completar este proceso.  Los precios de los medicamentos varan con frecuencia dependiendo del Environmental consultant de dnde se surte la receta y alguna farmacias pueden ofrecer precios ms baratos.  El sitio web www.goodrx.com tiene cupones para medicamentos de Airline pilot. Los precios aqu no tienen en cuenta  lo que podra costar con la ayuda del seguro (puede ser ms barato con su seguro), pero el sitio web puede darle el precio si no Field seismologist.  - Puede imprimir el cupn correspondiente y llevarlo con su receta a la farmacia.  - Tambin puede pasar por nuestra oficina durante el horario de atencin regular y Charity fundraiser una tarjeta de cupones de GoodRx.  - Si necesita que su receta se enve electrnicamente a una farmacia diferente, informe a nuestra oficina a travs de MyChart de  o por telfono llamando al [phone] y presione la opcin 4.

## 2022-04-27 ENCOUNTER — Encounter: Payer: Self-pay | Admitting: Dermatology

## 2022-05-01 ENCOUNTER — Encounter: Payer: Self-pay | Admitting: Internal Medicine

## 2022-05-04 ENCOUNTER — Other Ambulatory Visit: Payer: Self-pay | Admitting: Internal Medicine

## 2022-05-04 MED ORDER — LYUMJEV 100 UNIT/ML IJ SOLN: INTRAMUSCULAR | 11 refills | Status: DC

## 2022-05-05 ENCOUNTER — Other Ambulatory Visit (HOSPITAL_COMMUNITY): Payer: Self-pay

## 2022-05-05 ENCOUNTER — Telehealth: Payer: Self-pay

## 2022-05-05 NOTE — Telephone Encounter (Signed)
Patient Advocate Encounter   Received notification that prior authorization for Lyumjev 100UNIT/ML solution is required/requested.    PA submitted on 05/05/22 to [place] via [place]  Status is pending

## 2022-05-10 ENCOUNTER — Other Ambulatory Visit (HOSPITAL_COMMUNITY): Payer: Self-pay

## 2022-05-10 NOTE — Telephone Encounter (Addendum)
Patient Advocate Encounter  Received notification from [place] that the request for prior authorization for Lyumjev 100UNIT/ML solution has been denied due to The member has not tried and failed the required number of formulary alternative. Formulary alternative(s) are [facility]. Requirement: 3 in a class with 3 or more alternatives, 2 in a class with 2 alternatives, or 1 in a class with only 1 alternative.    Key: [id number]  This determination is currently being reviewed for appeal. Patient has tried both [place] and Novolog.

## 2022-05-10 NOTE — Telephone Encounter (Signed)
New PA requested. Gave reasoning why the Fiasp and Novolog had already been failed, due to lessened effectiveness after 48 hours in the pt's pump.   PA # [id number] via phone to [facility] [phone] - We should receive a fax determination within 72 business hours.  # to call for f/u if needed was given as [phone]

## 2022-05-10 NOTE — Telephone Encounter (Signed)
Patient Advocate Encounter  Prior Authorization for [facility] has been approved.     Effective: 05-10-2022 to 05-11-2023

## 2022-05-19 DIAGNOSIS — G43719 Chronic migraine without aura, intractable, without status migrainosus: Secondary | ICD-10-CM | POA: Diagnosis not present

## 2022-05-19 DIAGNOSIS — G4733 Obstructive sleep apnea (adult) (pediatric): Secondary | ICD-10-CM | POA: Diagnosis not present

## 2022-05-19 DIAGNOSIS — G43809 Other migraine, not intractable, without status migrainosus: Secondary | ICD-10-CM | POA: Diagnosis not present

## 2022-05-19 DIAGNOSIS — E1142 Type 2 diabetes mellitus with diabetic polyneuropathy: Secondary | ICD-10-CM | POA: Diagnosis not present

## 2022-05-24 DIAGNOSIS — E1042 Type 1 diabetes mellitus with diabetic polyneuropathy: Secondary | ICD-10-CM | POA: Diagnosis not present

## 2022-05-24 DIAGNOSIS — Z9641 Presence of insulin pump (external) (internal): Secondary | ICD-10-CM | POA: Diagnosis not present

## 2022-05-24 DIAGNOSIS — E1010 Type 1 diabetes mellitus with ketoacidosis without coma: Secondary | ICD-10-CM | POA: Diagnosis not present

## 2022-05-24 DIAGNOSIS — Z794 Long term (current) use of insulin: Secondary | ICD-10-CM | POA: Diagnosis not present

## 2022-05-25 DIAGNOSIS — Z1231 Encounter for screening mammogram for malignant neoplasm of breast: Secondary | ICD-10-CM | POA: Diagnosis not present

## 2022-05-26 DIAGNOSIS — G4733 Obstructive sleep apnea (adult) (pediatric): Secondary | ICD-10-CM | POA: Diagnosis not present

## 2022-05-27 ENCOUNTER — Telehealth: Payer: Self-pay | Admitting: Internal Medicine

## 2022-05-27 ENCOUNTER — Other Ambulatory Visit: Payer: Self-pay | Admitting: Internal Medicine

## 2022-05-27 ENCOUNTER — Encounter: Payer: Self-pay | Admitting: Internal Medicine

## 2022-05-27 DIAGNOSIS — G43719 Chronic migraine without aura, intractable, without status migrainosus: Secondary | ICD-10-CM | POA: Diagnosis not present

## 2022-05-27 DIAGNOSIS — E1042 Type 1 diabetes mellitus with diabetic polyneuropathy: Secondary | ICD-10-CM

## 2022-05-27 DIAGNOSIS — G4733 Obstructive sleep apnea (adult) (pediatric): Secondary | ICD-10-CM | POA: Diagnosis not present

## 2022-05-27 MED ORDER — INSULIN LISPRO 100 UNIT/ML IJ SOLN: INTRAMUSCULAR | 11 refills | Status: DC

## 2022-05-27 NOTE — Telephone Encounter (Signed)
ERROR

## 2022-05-27 NOTE — Telephone Encounter (Signed)
[facility] called and explained what is going on with the patient and requested an urgent prescription for Humalog.  I checked with nurse and she said she would check with Dr. Cruzita Lederer to see what may be sent in.  When I got back to the phone the pharmacist hung up.  I called patient and explained what the nurse told me and patient stated that he understood and would wait for prescription/further instruction.

## 2022-05-28 MED ORDER — FIASP 100 UNIT/ML IJ SOLN: INTRAMUSCULAR | 11 refills | Status: DC

## 2022-05-31 ENCOUNTER — Other Ambulatory Visit (HOSPITAL_COMMUNITY): Payer: Self-pay

## 2022-05-31 ENCOUNTER — Telehealth: Payer: Self-pay

## 2022-05-31 NOTE — Telephone Encounter (Signed)
Pharmacy Patient Advocate Encounter   Received notification from Cover My Meds that prior authorization for HumaLOG 100UNIT/ML solution is required/requested.  PA submitted on 05/31/2022 to [place] via [place]  Status is pending

## 2022-06-01 NOTE — Telephone Encounter (Signed)
Patient Advocate Encounter  Prior Authorization for HumaLOG 100UNIT/ML solution has been approved.    PA# [id number] Key: [id number] Effective dates: 06/01/2022 through 06/02/2023

## 2022-06-04 DIAGNOSIS — E1042 Type 1 diabetes mellitus with diabetic polyneuropathy: Secondary | ICD-10-CM | POA: Diagnosis not present

## 2022-06-04 DIAGNOSIS — Z794 Long term (current) use of insulin: Secondary | ICD-10-CM | POA: Diagnosis not present

## 2022-06-04 DIAGNOSIS — Z9641 Presence of insulin pump (external) (internal): Secondary | ICD-10-CM | POA: Diagnosis not present

## 2022-06-04 DIAGNOSIS — E1010 Type 1 diabetes mellitus with ketoacidosis without coma: Secondary | ICD-10-CM | POA: Diagnosis not present

## 2022-06-14 DIAGNOSIS — R921 Mammographic calcification found on diagnostic imaging of breast: Secondary | ICD-10-CM | POA: Diagnosis not present

## 2022-06-26 DIAGNOSIS — G4733 Obstructive sleep apnea (adult) (pediatric): Secondary | ICD-10-CM | POA: Diagnosis not present

## 2022-07-06 ENCOUNTER — Encounter: Payer: Self-pay | Admitting: Internal Medicine

## 2022-07-06 ENCOUNTER — Ambulatory Visit (INDEPENDENT_AMBULATORY_CARE_PROVIDER_SITE_OTHER): Payer: 59 | Admitting: Internal Medicine

## 2022-07-06 VITALS — BP 110/80 | HR 94 | Ht 67.0 in | Wt 195.2 lb

## 2022-07-06 DIAGNOSIS — E782 Mixed hyperlipidemia: Secondary | ICD-10-CM

## 2022-07-06 DIAGNOSIS — E039 Hypothyroidism, unspecified: Secondary | ICD-10-CM

## 2022-07-06 DIAGNOSIS — E1042 Type 1 diabetes mellitus with diabetic polyneuropathy: Secondary | ICD-10-CM

## 2022-07-06 LAB — POCT GLYCOSYLATED HEMOGLOBIN (HGB A1C): Hemoglobin A1C: 6.5 % — AB (ref 4.0–5.6)

## 2022-07-06 LAB — T4, FREE: Free T4: 1.18 ng/dL (ref 0.60–1.60)

## 2022-07-06 LAB — TSH: TSH: 0.12 u[IU]/mL — ABNORMAL LOW (ref 0.35–5.50)

## 2022-07-06 NOTE — Progress Notes (Signed)
Patient ID: Eric Hayden, male   DOB: September 03, 1975, 46 y.o.   MRN: [id number]  HPI: Eric Hayden is a 46 y.o.-year-old pleasant male, initially referred by his PCP, Dr. Nicki Hayden, returning for follow-up for DM1, diagnosed at 83 months old -reportedly undetectable C-peptide, fairly well controlled, with complications (peripheral neuropathy, h/o diabetic retinopathy s/p distant laser therapy). He previously saw endocrinology in [facility] (Dr. Elisabeth Hayden), last office visit in 05/2021.  He had to stop seeing her due to change in insurance.  Last visit with me 4 months ago.  Interim history: No increased urination, blurry vision, nausea, chest pain. He has a cough-allergies.  Reviewed HbA1c levels: Lab Results  Component Value Date   HGBA1C 6.8 (A) 03/01/2022   HGBA1C 6.7 (A) 11/03/2021   HGBA1C 7.1 (H) 03/11/2021    Insulin pump:  - started [place] - out of warranty 04/2021 - but with [facility] technology Per endo note from 05/2021: Currently using MiniMed pump with CGM, and warranty just ran out.  Has had [employer] for awhile, and they have approved his pump supplies via [place] this summer when [place] stopped approving reservoirs (but they have continued to send everything EXCEPT the reservoirs). He received a letter saying that they cannot send reservoirs b/c they had not received information that he had failed MDIs (when he has been on pump > 28yr).  - now T:slim-started since last visit  CGM: - Medtronic   Insulin: - NovoLog >> Fiasp -feels that sugars are better controlled on this/(Humalog if cannot get FiAsp)  Supplies: - [place] - having pbs with them...  Pump settings: - basal rates: 12 am: 1.9 >> 2.0 units/h 3 am: 2.0 >> 2.1 6 am: 1.6 4 pm: 1.3 6 pm: 1.25 9 pm: 1.9 >> 1.8 - ICR: 1:10 - Target: 100-140 except 6 am-9 pm: 100-120 - ISF: 40 - Active Insulin Time: 3:30 >> 5 h - changes infusion site: q2 days TDD from basal  insulin: 46% (47.6 units) >> 33% (39.7 units) >> 35% (41 units) TDD from bolus insulin: 54% (55.6 units) >> 67% (81.6 units) >>  65% (75 units) Total daily dose: 115-140 Average amount of carbs entered per day: 737 g!  Meter: One Touch Verio >> Contour Link >> AccuChek guide as insurance stopped covering the controlling which connected to his pump  Pt checks his sugars >4 a day with his CGM (with receiver):   Previously,   Previously:   Lowest sugar was 40 >> 42 afterworking in the yard >> 30s after working in the yard.; he has hypoglycemia awareness at 60. Hyperglycemia awareness at 240-250.   He has a glucagon kit at home. No previous hypoglycemia admission.  He has Baqsimi at home. Highest sugar was 321 (technical pb.)>> 300 (pb with the site) >> 200s. No previous DKA admissions.    Pt's meals are: - Breakfast: Cereal, juice, fruit - Lunch: salad, tea, sandwich - Dinner: Pasta, casserole, steak or chicken, vegetables, rice/potatoes, milk - Snacks: 3-4 He plays tennis for exercise.  - no CKD, last BUN/creatinine:  Lab Results  Component Value Date   BUN 16 11/04/2021   BUN 20 07/07/2021   CREATININE 1.07 11/04/2021   CREATININE 0.98 07/07/2021  No ACEI/ ARBs.  - + HL;  last set of lipids: Lab Results  Component Value Date   CHOL 125 11/04/2021   HDL 33.20 (L) 11/04/2021   LDLCALC 57 11/04/2021   TRIG 175.0 (H) 11/04/2021   CHOLHDL  4 11/04/2021  He is on Lipitor 20 mg daily.  - last eye exam was in 2022. No DR reportedly.  [facility].  - + pain, numbness, and tingling in his feet.  On amitriptyline 25 mg daily at bedtime and gabapentin 600 mg 2x a day - per neurology.  Last foot exam 02/04/2022 He was on Lyrica before >> not covered anymore.   He has family history of DM2 in mother, MGM, etc. DM1 in maternal uncle.  Hypothyroidism: - dx'ed in ~2009 -Managed by PCP  Pt is on levothyroxine 125 mcg daily (increased 09/2021, then decreased 10/2021),  taken: - in am - fasting - at least 30 min from b'fast - no calcium - no iron - no multivitamins - no PPIs - not on Biotin  Lab Results  Component Value Date   TSH 0.27 (L) 03/01/2022   TSH 0.14 (L) 11/04/2021   TSH 4.650 (H) 09/04/2021   TSH 15.01 (H) 07/07/2021   TSH 2.01 05/04/2021   TSH 0.05 (L) 03/11/2021   TSH 3.50 09/05/2019   TSH 1.40 05/17/2017   TSH 0.73 02/10/2016   TSH 0.44 07/25/2015   TSH 0.59 12/20/2014   TSH 5.11 04/06/2014   + FH in M and F. No FH of thyroid cancer. No h/o radiation tx to head or neck. No herbal supplements. No Biotin use. No recent steroids use.   Patient also has a history of migraine without aura - on Topamax, GERD. Also, per review of endocrinology note from 05/2021: 1. T1DM dx at 50 months old, reportedly undetectable cpeptide with prior study:  Hx minor background retinopathy w distant hx of laser tx. No recent DR reported, 11/2016.  No microalbuminuria, 08/2018   No sig peripheral neuropathy, normal monofilament 05/2021  hx of L shoulder superficial chest pain attributed to neuropathy managed on lyrica & elavil (started by [facility] pain clinic, was also evaluated by [facility], Neurology at the time), subsequently changed to neurontin and elavil.   Hx of gastroparesis sx (did not have gastric emptying study) which previously responded to reglan x 10 days, has not required since. 2. Hypothyroidism 3. OSA w cpap 4. Asthma 5. HTN 6. HLD 7. Hx intermittent L shoulder chest pain s/p several normal stress tests, including nuclear 2014 time frame --> attributed to neuropathy (see above) 8. Vestibular migraines w intermittent vertigo previously tx w trokendi 9. Adhesive capsulitis 05/2019 10. Celiac disease dx 2021  ROS: + see HPI  Past Medical History:  Diagnosis Date   Asthma    Cancer ([place])    appendiceal   Chicken pox    GERD (gastroesophageal reflux disease)    Heart murmur    Hx of oral aphthous ulcers    Hypothyroidism     Migraines    Sleep apnea    Type 1 diabetes mellitus ([place])    Past Surgical History:  Procedure Laterality Date   APPENDECTOMY  2012   COLONOSCOPY WITH PROPOFOL N/A 10/15/2019   Procedure: COLONOSCOPY WITH PROPOFOL;  Surgeon: Jonathon Bellows, MD;  Location: [facility] ENDOSCOPY;  Service: Gastroenterology;  Laterality: N/A;   ESOPHAGOGASTRODUODENOSCOPY (EGD) WITH PROPOFOL N/A 10/15/2019   Procedure: ESOPHAGOGASTRODUODENOSCOPY (EGD) WITH PROPOFOL;  Surgeon: Jonathon Bellows, MD;  Location: [facility] ENDOSCOPY;  Service: Gastroenterology;  Laterality: N/A;   TONSILLECTOMY AND ADENOIDECTOMY  2007   UPPER GI ENDOSCOPY     Social History   Socioeconomic History   Marital status: Married    Spouse name: Not on file   Number of children:  2   Years of education: Not on file   Highest education level: Not on file  Occupational History   Occupation: Theme park manager, [place] [employer]  Tobacco Use   Smoking status: Never   Smokeless tobacco: Never  Vaping Use   Vaping Use: Never used  Substance and Sexual Activity   Alcohol use: No   Drug use: No   Sexual activity: Not on file  Other Topics Concern   Not on file  Social History Narrative   Not on file   Social Determinants of Health   Financial Resource Strain: Not on file  Food Insecurity: Not on file  Transportation Needs: Not on file  Physical Activity: Not on file  Stress: Not on file  Social Connections: Not on file  Intimate Partner Violence: Not on file   Current Outpatient Medications on File Prior to Visit  Medication Sig Dispense Refill   albuterol (VENTOLIN HFA) 108 (90 Base) MCG/ACT inhaler Inhale 2 puffs into the lungs every 6 (six) hours as needed for wheezing or shortness of breath. 18 g 0   amitriptyline (ELAVIL) 25 MG tablet Take 1 tablet (25 mg total) by mouth at bedtime. 90 tablet 1   atorvastatin (LIPITOR) 20 MG tablet TAKE 1 TABLET BY MOUTH DAILY 90 tablet 3   Blood Glucose Monitoring Suppl ([employer])  w/Device KIT See admin instructions.     candesartan (ATACAND) 4 MG tablet Take 4 mg by mouth daily.     Cholecalciferol (VITAMIN D3 PO) Take by mouth.     Continuous Blood Gluc Transmit (DEXCOM G6 TRANSMITTER) MISC 1 Device by Does not apply route every 3 (three) months. 1 each 3   CONTOUR NEXT TEST test strip   1   cyproheptadine (PERIACTIN) 4 MG tablet Take 4 mg by mouth 2 (two) times daily.     folic acid (FOLVITE) 381 MCG tablet Take by mouth.     gabapentin (NEURONTIN) 300 MG capsule Take one tablet 2x/day 180 capsule 1   Insulin Aspart, w/Niacinamide, (FIASP) 100 UNIT/ML SOLN Inject up to 170 units per day 50 mL 11   insulin lispro (HUMALOG) 100 UNIT/ML injection Inject under the skin 130-160 units a day as advised 50 mL 11   levothyroxine (SYNTHROID) 125 MCG tablet Take 1 tablet (125 mcg total) by mouth daily before breakfast. 90 tablet 1   omeprazole (PRILOSEC) 20 MG capsule TAKE 1 CAPSULE EVERY DAY 90 capsule 0   Probiotic Product (PROBIOTIC PO) Take by mouth.     topiramate (TOPAMAX) 25 MG tablet TAKE 1 TO 2 TABLETS BY MOUTH EVERY DAY 60 tablet 1   No current facility-administered medications on file prior to visit.   Allergies  Allergen Reactions   Augmentin [Amoxicillin-Pot Clavulanate] Nausea And Vomiting   Levaquin [Levofloxacin In D5w] Swelling    Blurred vision   Family History  Problem Relation Age of Onset   Arthritis Maternal Grandmother    Hyperlipidemia Maternal Grandmother    Heart disease Maternal Grandmother    Hypertension Maternal Grandmother    Diabetes Maternal Grandmother        type 2   Heart attack Maternal Grandmother    Arthritis Maternal Grandfather    Heart disease Maternal Grandfather    Hypertension Maternal Grandfather    Heart attack Maternal Grandfather    Arthritis Mother    Hyperlipidemia Mother    Hypertension Mother    Diabetes Mother        type 2  Hyperlipidemia Father    Arthritis Paternal Grandmother    Arthritis Paternal  Grandfather    Heart attack Paternal Aunt    PE: BP 110/80 (BP Location: Left Arm, Patient Position: Sitting, Cuff Size: Normal)   Pulse 94   Ht _0  (1.702 m)   Wt 195 lb 3.2 oz (88.5 kg)   SpO2 96%   BMI 30.57 kg/m  Wt Readings from Last 3 Encounters:  07/06/22 195 lb 3.2 oz (88.5 kg)  03/01/22 197 lb 9.6 oz (89.6 kg)  02/04/22 194 lb 9.6 oz (88.3 kg)   Constitutional: overweight, in NAD Eyes: no exophthalmos ENT: no masses palpated in neck, no cervical lymphadenopathy Cardiovascular: Tachycardia, RR, No MRG Respiratory: CTA B Musculoskeletal: no deformities Skin: no rashes Neurological: no tremor with outstretched hands  ASSESSMENT: 1. DM1, fairly well controlled, with complications - Peripheral neuropathy - h/o DR -resolved after laser surgery  2. HL  3. Hypothyroidism  PLAN:  1. Patient with longstanding, fairly well-controlled type 1 diabetes, on t:slim insulin pump, changed after last visit from the Medtronic pump.  He has Fiasp in the pump, which he likes. CGM interpretation: -At today's visit, we reviewed his CGM downloads: It appears that 81% of values are in target range (goal >70%), while 16% are higher than 180 (goal <25%), and 3% are lower than 70 (goal <4%).  The calculated average blood sugar is 141.  The projected HbA1c for the next 3 months (GMI) is 6.7%. -Reviewing the CGM trends, sugars are fluctuating within the target range but slightly higher after lunch.  He occasionally has lower blood sugars around 6-7 PM, from review of his insulin pump, [place] likely due to staggering carb entry during lunch.  We did discuss about entering the carbs all at the same time if possible.  If not possible, I advised her to do a manual bolus for the second carb entry to eliminate overcorrection for the postprandial blood sugar.  We discussed about other 2 boluses options to avoid overcorrection (entering a virtual blood sugar that is already at target or subtracting the  amount needed for correction from the insulin calculated for the carbs-but this requires calculating the boluses more in-depth) as since the sugars are increasing after the initial set of carb entry and starting the boluses, I did advise him to strengthen his insulin to carb ratio for lunch. - I suggested to: Patient Instructions  Please use the following pump settings: - basal rates: 12 am: 2.0 units/h 3 am: 2.1 6 am: 1.6 4 pm: 1.3 6 pm: 1.25 9 pm: 1.8 - ICR: 1:10, except: 11:30 am -1 pm: 1:10 >> 1:8 - Target: 100-140 except 6 am-9 pm: 100-120 - ISF: 40 - Active Insulin Time: 5 h - bolus wizard: on  If you enter carbs successively, try to do a manual bolus for the second carb entry or enter less carbs (calculate how many).  Please continue Levothyroxine 125 mcg daily.  Take the thyroid hormone every day, with water, at least 30 minutes before breakfast, separated by at least 4 hours from: - acid reflux medications - calcium - iron - multivitamins  Please stop at the lab.  Please return in 4 months.  - we checked his HbA1c: 6.5% (lower) - advised to check sugars at different times of the day - 4x a day, rotating check times - advised for yearly eye exams >> he is not UTD - he has peripheral neuropathy and his previous endocrinologist was refilling his amitriptyline and  Neurontin.  However, he is seeing neurology and I suggested to have them refill these, if they agree. - return to clinic in 3-4 months  2.  Hyperlipidemia -Reviewed latest lipid panel from 10/2021: LDL at goal, HDL low, triglycerides slightly high: Lab Results  Component Value Date   CHOL 125 11/04/2021   HDL 33.20 (L) 11/04/2021   LDLCALC 57 11/04/2021   TRIG 175.0 (H) 11/04/2021   CHOLHDL 4 11/04/2021  -He continues on Lipitor 20 mg daily without side effects  3. Hypothyroidism -This is managed by PCP -Latest TSH was suppressed: Lab Results  Component Value Date   TSH 0.27 (L) 03/01/2022  -After  the above results returned, LT4 was decreased from 137 to 125 mcg daily  -He is taking the levothyroxine correctly -He is due for another TSH check -we will have this checked today  Philemon Kingdom, MD PhD [facility] Endocrinology

## 2022-07-06 NOTE — Patient Instructions (Addendum)
Please use the following pump settings: - basal rates: 12 am: 2.0 units/h 3 am: 2.1 6 am: 1.6 4 pm: 1.3 6 pm: 1.25 9 pm: 1.8 - ICR: 1:10, except: 11:30 am -1 pm: 1:10 >> 1:8 - Target: 100-140 except 6 am-9 pm: 100-120 - ISF: 40 - Active Insulin Time: 5 h - bolus wizard: on  If you enter carbs successively, try to do a manual bolus for the second carb entry or enter less carbs (calculate how many).  Please continue Levothyroxine 125 mcg daily.  Take the thyroid hormone every day, with water, at least 30 minutes before breakfast, separated by at least 4 hours from: - acid reflux medications - calcium - iron - multivitamins  Please stop at the lab.  Please return in 4 months.

## 2022-07-07 MED ORDER — LEVOTHYROXINE SODIUM 112 MCG PO TABS: 112.0000 ug | ORAL_TABLET | Freq: Every day | ORAL | 3 refills | Status: DC

## 2022-07-07 NOTE — Addendum Note (Signed)
Addended by: Philemon Kingdom on: 07/07/2022 09:58 AM   Modules accepted: Orders

## 2022-07-14 ENCOUNTER — Other Ambulatory Visit (HOSPITAL_COMMUNITY): Payer: Self-pay

## 2022-07-26 DIAGNOSIS — G4733 Obstructive sleep apnea (adult) (pediatric): Secondary | ICD-10-CM | POA: Diagnosis not present

## 2022-08-05 ENCOUNTER — Other Ambulatory Visit: Payer: Self-pay | Admitting: Internal Medicine

## 2022-08-16 ENCOUNTER — Other Ambulatory Visit: Payer: Self-pay | Admitting: Internal Medicine

## 2022-08-24 DIAGNOSIS — E1042 Type 1 diabetes mellitus with diabetic polyneuropathy: Secondary | ICD-10-CM | POA: Diagnosis not present

## 2022-08-24 DIAGNOSIS — E1010 Type 1 diabetes mellitus with ketoacidosis without coma: Secondary | ICD-10-CM | POA: Diagnosis not present

## 2022-08-24 DIAGNOSIS — Z794 Long term (current) use of insulin: Secondary | ICD-10-CM | POA: Diagnosis not present

## 2022-08-24 DIAGNOSIS — Z9641 Presence of insulin pump (external) (internal): Secondary | ICD-10-CM | POA: Diagnosis not present

## 2022-10-04 DIAGNOSIS — E103293 Type 1 diabetes mellitus with mild nonproliferative diabetic retinopathy without macular edema, bilateral: Secondary | ICD-10-CM | POA: Diagnosis not present

## 2022-10-04 LAB — HM DIABETES EYE EXAM

## 2022-10-08 ENCOUNTER — Encounter: Payer: Self-pay | Admitting: Endocrinology

## 2022-10-20 DIAGNOSIS — G4733 Obstructive sleep apnea (adult) (pediatric): Secondary | ICD-10-CM | POA: Diagnosis not present

## 2022-10-27 ENCOUNTER — Encounter: Payer: Self-pay | Admitting: Internal Medicine

## 2022-10-28 ENCOUNTER — Other Ambulatory Visit: Payer: Self-pay | Admitting: Internal Medicine

## 2022-10-28 ENCOUNTER — Other Ambulatory Visit: Payer: Self-pay

## 2022-10-28 MED ORDER — INSULIN LISPRO 100 UNIT/ML IJ SOLN: INTRAMUSCULAR | 11 refills | Status: DC

## 2022-10-29 ENCOUNTER — Other Ambulatory Visit: Payer: Self-pay

## 2022-10-29 MED ORDER — INSULIN LISPRO 100 UNIT/ML IJ SOLN
160.0000 [IU] | INTRAMUSCULAR | 11 refills | Status: DC
  Filled 2022-10-29: qty 40, 25d supply, fill #0

## 2022-11-08 ENCOUNTER — Ambulatory Visit: Payer: 59 | Admitting: Internal Medicine

## 2022-11-08 ENCOUNTER — Encounter: Payer: Self-pay | Admitting: Internal Medicine

## 2022-11-08 VITALS — BP 120/76 | HR 88 | Ht 67.0 in | Wt 194.6 lb

## 2022-11-08 DIAGNOSIS — E782 Mixed hyperlipidemia: Secondary | ICD-10-CM

## 2022-11-08 DIAGNOSIS — E1042 Type 1 diabetes mellitus with diabetic polyneuropathy: Secondary | ICD-10-CM

## 2022-11-08 DIAGNOSIS — E039 Hypothyroidism, unspecified: Secondary | ICD-10-CM | POA: Diagnosis not present

## 2022-11-08 LAB — POCT GLYCOSYLATED HEMOGLOBIN (HGB A1C): Hemoglobin A1C: 6.5 % — AB (ref 4.0–5.6)

## 2022-11-08 LAB — TSH: TSH: 0.55 u[IU]/mL (ref 0.35–5.50)

## 2022-11-08 LAB — T4, FREE: Free T4: 1.07 ng/dL (ref 0.60–1.60)

## 2022-11-08 NOTE — Patient Instructions (Addendum)
Please use the following pump settings: - basal rates: 12 am: 2.0 units/h 3 am: 2.1 6 am: 1.6 4 pm: 1.4 >> 1.3 6 pm: 1.4 >> 1.3 9 pm: 1.8 - ICR:  12 am: 1:10 6 am: 1:10 >> 1:8 11:30 am: 1:8 >> 1:6 1 pm: 1:10 - Target: 110 - ISF: 40 - Active Insulin Time: 5 h - bolus wizard: on  If you do not enter all the carbs at the beginning of the meal, just do a correction when the postprandial sugars are high.  Please continue Levothyroxine 112 mcg daily.  Take the thyroid hormone every day, with water, at least 30 minutes before breakfast, separated by at least 4 hours from: - acid reflux medications - calcium - iron - multivitamins  Please stop at the lab.  Please return in 4 months.

## 2022-11-08 NOTE — Progress Notes (Addendum)
Patient ID: Eric Hayden, male   DOB: June 20, 1976, 47 y.o.   MRN: [id number]  HPI: Eric Hayden is a 47 y.o.-year-old pleasant male, initially referred by his PCP, Dr. Lorin Picket, returning for follow-up for DM1, diagnosed at 35 months old -reportedly undetectable C-peptide, fairly well controlled, with complications (peripheral neuropathy, h/o diabetic retinopathy s/p distant laser therapy). He previously saw endocrinology in [facility] (Dr. Leretha Pol), last office visit in 05/2021.  He had to stop seeing her due to change in insurance.  Last visit with me 4 months ago.  Interim history: No increased urination, blurry vision, chest pain. He is having a hard time with migraines.  This started approximately 5 years ago and have been resistant to all treatments.  He is starting to get Botox injections and he felt an improvement, but in the new year, his insurance did not cover them anymore.  He is seeing neurology and may need to go see a pain specialist.  Reviewed HbA1c levels: Lab Results  Component Value Date   HGBA1C 6.5 (A) 07/06/2022   HGBA1C 6.8 (A) 03/01/2022   HGBA1C 6.7 (A) 11/03/2021    Insulin pump:  - started 1999 - Medtronic 670 - out of warranty 04/2021 - but with [facility] technology Per endo note from 05/2021: Currently using MiniMed pump with CGM, and warranty just ran out.  Has had [employer] for awhile, and they have approved his pump supplies via [employer] UNTIL this summer when [employer] stopped approving reservoirs (but they have continued to send everything EXCEPT the reservoirs). He received a letter saying that they cannot send reservoirs b/c they had not received information that he had failed MDIs (when he has been on pump > 49yrs).  - now T:slim-started since last visit  CGM: - Medtronic   Insulin: - NovoLog >> Fiasp -feels that sugars are better controlled on this/(Humalog if cannot get FiAsp) - Humalog now  Supplies: - [employer] - having  pbs with them...  Pump settings: - basal rates: 12 am: 2.0 units/h 3 am: 2.1 6 am: 1.6 4 pm: 1.3 >> 1.4 6 pm: 1.25 >> 1.4 9 pm: 1.8 - ICR: 1:10, except: 11:30 am -1 pm: 1:10 >> 1:8 - Target: 110 - ISF: 40 -Active insulin time: 5 hours - changes infusion site: q2 days TDD from basal insulin: 46% (47.6 units) >> 33% (39.7 units) >> 35% (41 units) >> 43% (41 units) TDD from bolus insulin: 54% (55.6 units) >> 67% (81.6 units) >>  65% (75 units) >> 57% (55 units) Total daily dose: 115-140 >> 96-140 units Average amount of carbs entered per day: 737 g >> 512 g  Meter: One Touch Verio >> Contour Link >> AccuChek guide as insurance stopped covering the controlling which connected to his pump  Pt checks his sugars >4 a day with his CGM (with receiver):   Previously:  Previously,  Lowest sugar was 40 >> 42 afterworking in the yard >> 30s after working in the yard >> LO; he has hypoglycemia awareness at 60. Hyperglycemia awareness at 240-250.   He has a glucagon kit at home. No previous hypoglycemia admission.  He has Baqsimi at home. Highest sugar was 321 (technical pb.)>> 300 (pb with the site) >> 200s >> 300s. No previous DKA admissions.    Pt's meals are: - Breakfast: Cereal, juice, fruit - Lunch: salad, tea, sandwich - Dinner: Pasta, casserole, steak or chicken, vegetables, rice/potatoes, milk - Snacks: 3-4 He plays tennis for exercise.  - no CKD,  last BUN/creatinine:  Lab Results  Component Value Date   BUN 16 11/04/2021   BUN 20 07/07/2021   CREATININE 1.07 11/04/2021   CREATININE 0.98 07/07/2021  No ACEI/ ARBs.  - + HL;  last set of lipids: Lab Results  Component Value Date   CHOL 125 11/04/2021   HDL 33.20 (L) 11/04/2021   LDLCALC 57 11/04/2021   TRIG 175.0 (H) 11/04/2021   CHOLHDL 4 11/04/2021  He is on Lipitor 20 mg daily.  - last eye exam was 10/04/2022. + DR.  [facility].  - + pain, numbness, and tingling in his feet.  On amitriptyline 25 mg  daily at bedtime and gabapentin 600 mg 2x a day - per neurology.  Last foot exam 02/04/2022 He was on Lyrica before >> not covered anymore.   He has family history of DM2 in mother, MGM, etc. DM1 in maternal uncle.  Hypothyroidism: - dx'ed in ~2009  Pt is on levothyroxine 112 mcg daily (increased 09/2021, then decreased 10/2021, then decreased MEN 07/2022), taken: - in am - fasting - at least 30 min from b'fast - no calcium - no iron - no multivitamins - no PPIs - not on Biotin  Lab Results  Component Value Date   TSH 0.12 (L) 07/06/2022   TSH 0.27 (L) 03/01/2022   TSH 0.14 (L) 11/04/2021   TSH 4.650 (H) 09/04/2021   TSH 15.01 (H) 07/07/2021   TSH 2.01 05/04/2021   TSH 0.05 (L) 03/11/2021   TSH 3.50 09/05/2019   TSH 1.40 05/17/2017   TSH 0.73 02/10/2016   TSH 0.44 07/25/2015   TSH 0.59 12/20/2014   TSH 5.11 04/06/2014   + FH in M and F. No FH of thyroid cancer. No h/o radiation tx to head or neck. No herbal supplements. No Biotin use. No recent steroids use.   Patient also has a history of migraine without aura - on Topamax, GERD. Also, per review of endocrinology note from 05/2021: 1. T1DM dx at 7911 months old, reportedly undetectable cpeptide with prior study:  Hx minor background retinopathy w distant hx of laser tx. No recent DR reported, 11/2016.  No microalbuminuria, 08/2018   No sig peripheral neuropathy, normal monofilament 05/2021  hx of L shoulder superficial chest pain attributed to neuropathy managed on lyrica & elavil ([facility], was also evaluated by [facility] Neurology at the time), subsequently changed to neurontin and elavil.   Hx of gastroparesis sx (did not have gastric emptying study) which previously responded to reglan x 10 days, has not required since. 2. Hypothyroidism 3. OSA w cpap 4. Asthma 5. HTN 6. HLD 7. Hx intermittent L shoulder chest pain s/p several normal stress tests, including nuclear 2014 time frame --> attributed to  neuropathy (see above) 8. Vestibular migraines w intermittent vertigo previously tx w trokendi 9. Adhesive capsulitis 05/2019 10. Celiac disease dx 2021  ROS: + see HPI  Past Medical History:  Diagnosis Date   Asthma    Cancer (HCC)    appendiceal   Chicken pox    GERD (gastroesophageal reflux disease)    Heart murmur    Hx of oral aphthous ulcers    Hypothyroidism    Migraines    Sleep apnea    Type 1 diabetes mellitus (HCC)    Past Surgical History:  Procedure Laterality Date   APPENDECTOMY  2012   COLONOSCOPY WITH PROPOFOL N/A 10/15/2019   Procedure: COLONOSCOPY WITH PROPOFOL;  Surgeon: Wyline MoodAnna, Kiran, MD;  Location:  [facility] ENDOSCOPY;  Service: Gastroenterology;  Laterality: N/A;   ESOPHAGOGASTRODUODENOSCOPY (EGD) WITH PROPOFOL N/A 10/15/2019   Procedure: ESOPHAGOGASTRODUODENOSCOPY (EGD) WITH PROPOFOL;  Surgeon: Wyline Mood, MD;  Location: [facility] ENDOSCOPY;  Service: Gastroenterology;  Laterality: N/A;   TONSILLECTOMY AND ADENOIDECTOMY  2007   UPPER GI ENDOSCOPY     Social History   Socioeconomic History   Marital status: Married    Spouse name: Not on file   Number of children: 2   Years of education: Not on file   Highest education level: Not on file  Occupational History   Occupation: Education officer, environmental, [employer]  Tobacco Use   Smoking status: Never   Smokeless tobacco: Never  Vaping Use   Vaping Use: Never used  Substance and Sexual Activity   Alcohol use: No   Drug use: No   Sexual activity: Not on file  Other Topics Concern   Not on file  Social History Narrative   Not on file   Social Determinants of Health   Financial Resource Strain: Not on file  Food Insecurity: Not on file  Transportation Needs: Not on file  Physical Activity: Not on file  Stress: Not on file  Social Connections: Not on file  Intimate Partner Violence: Not on file   Current Outpatient Medications on File Prior to Visit  Medication Sig Dispense Refill   albuterol  (VENTOLIN HFA) 108 (90 Base) MCG/ACT inhaler Inhale 2 puffs into the lungs every 6 (six) hours as needed for wheezing or shortness of breath. 18 g 0   amitriptyline (ELAVIL) 25 MG tablet Take 1 tablet (25 mg total) by mouth at bedtime. 90 tablet 1   atorvastatin (LIPITOR) 20 MG tablet TAKE 1 TABLET BY MOUTH DAILY 90 tablet 3   Blood Glucose Monitoring Suppl (ONETOUCH VERIO FLEX SYSTEM) w/Device KIT See admin instructions.     candesartan (ATACAND) 4 MG tablet Take 4 mg by mouth daily.     Cholecalciferol (VITAMIN D3 PO) Take by mouth.     Continuous Blood Gluc Transmit (DEXCOM G6 TRANSMITTER) MISC 1 Device by Does not apply route every 3 (three) months. 1 each 3   CONTOUR NEXT TEST test strip   1   cyproheptadine (PERIACTIN) 4 MG tablet Take 4 mg by mouth 2 (two) times daily.     folic acid (FOLVITE) 800 MCG tablet Take by mouth.     gabapentin (NEURONTIN) 300 MG capsule TAKE 1 CAPSULE TWICE DAILY 180 capsule 1   Insulin Aspart, w/Niacinamide, (FIASP) 100 UNIT/ML SOLN Inject up to 170 units per day 50 mL 11   insulin lispro (HUMALOG) 100 UNIT/ML injection Inject under the skin up to 160 units a day as advised 40 mL 11   insulin lispro (HUMALOG) 100 UNIT/ML injection Inject into the skin daily up to 160 units a day as directed. 40 mL 11   levothyroxine (SYNTHROID) 112 MCG tablet Take 1 tablet (112 mcg total) by mouth daily. 45 tablet 3   omeprazole (PRILOSEC) 20 MG capsule TAKE 1 CAPSULE EVERY DAY 90 capsule 1   Probiotic Product (PROBIOTIC PO) Take by mouth.     topiramate (TOPAMAX) 25 MG tablet TAKE 1 TO 2 TABLETS BY MOUTH EVERY DAY 60 tablet 1   No current facility-administered medications on file prior to visit.   Allergies  Allergen Reactions   Augmentin [Amoxicillin-Pot Clavulanate] Nausea And Vomiting   Levaquin [Levofloxacin In D5w] Swelling    Blurred vision   Family History  Problem Relation Age  of Onset   Arthritis Maternal Grandmother    Hyperlipidemia Maternal Grandmother     Heart disease Maternal Grandmother    Hypertension Maternal Grandmother    Diabetes Maternal Grandmother        type 2   Heart attack Maternal Grandmother    Arthritis Maternal Grandfather    Heart disease Maternal Grandfather    Hypertension Maternal Grandfather    Heart attack Maternal Grandfather    Arthritis Mother    Hyperlipidemia Mother    Hypertension Mother    Diabetes Mother        type 2   Hyperlipidemia Father    Arthritis Paternal Grandmother    Arthritis Paternal Grandfather    Heart attack Paternal Aunt    PE: BP 120/76 (BP Location: Left Arm, Patient Position: Sitting, Cuff Size: Normal)   Pulse 88   Ht 5\' 7"  (1.702 m)   Wt 194 lb 9.6 oz (88.3 kg)   SpO2 98%   BMI 30.48 kg/m  Wt Readings from Last 3 Encounters:  11/08/22 194 lb 9.6 oz (88.3 kg)  07/06/22 195 lb 3.2 oz (88.5 kg)  03/01/22 197 lb 9.6 oz (89.6 kg)   Constitutional: overweight, in NAD Eyes: no exophthalmos ENT: no masses palpated in neck, no cervical lymphadenopathy Cardiovascular: RRR, No MRG Respiratory: CTA B Musculoskeletal: no deformities Skin: no rashes Neurological: no tremor with outstretched hands  ASSESSMENT: 1. DM1, fairly well controlled, with complications - Peripheral neuropathy - h/o DR -resolved after laser surgery  2. HL  3. Hypothyroidism  PLAN:  1. Patient with longstanding, fairly well-controlled type 1 diabetes, on the t:slim insulin pump, prev.  On the Medtronic pump.  He uses Fiasp insulin, which he likes. At last OV, his HbA1c was lower, at 6.5% and sugars were fluctuating within the target range but slightly higher after lunch.  He occasionally has lower blood sugars around 6-7 PM per review of his insulin pump downloads, most likely due to staggering carb entries during lunch.  We did discuss about entering these at the same time, if possible, but if not possible, to do manual boluses for the second carb entry, to eliminate overcorrection for the postprandial  blood sugar.  We discussed about 2 other options to avoid overcorrection (very low blood sugar blood sugar that is already in target or subtracting the amount needed for correction from the insulin calculated for the carbs-but this requires more complex calculations.  I also advised him to strengthen insulin to carb ratio for lunch. CGM interpretation: -At today's visit, we reviewed his CGM downloads: It appears that 81% of values are in target range (goal >70%), while 17% are higher than 180 (goal <25%), and 2% are lower than 70 (goal <4%).  The calculated average blood sugar is 144.  The projected HbA1c for the next 3 months (GMI) is 6.7%. -Reviewing the CGM trends, sugars appear to be increasing gradually in the morning with a peak after his brunch and then a decrease in blood sugar before dinner, with a nadir between 7 and 10 PM.  Sugars are higher afterwards and they again decreased between 3 AM and 6 AM.  Upon reviewing his pump settings, he did increase his basal rates in the late afternoon since last visit (he cannot remember why he did this) so we will go ahead and decrease them slightly.  I also advised him to strengthen his insulin to carb ratio for the lunch or brunch.  We also discussed that if he does  not enter all of his carbs into the pump at the beginning of the meal, and he has to do a correction later, when sugars already high, to only correct for the high blood sugars, but not enter any more carbs. - I suggested to: Patient Instructions   Patient Instructions  Please use the following pump settings: - basal rates: 12 am: 2.0 units/h 3 am: 2.1 6 am: 1.6 4 pm: 1.4 >> 1.3 6 pm: 1.4 >> 1.3 9 pm: 1.8 - ICR:  12 am: 1:10 6 am: 1:10 >> 1:8 11:30 am: 1:8 >> 1:6 1 pm: 1:10 - Target: 110 - ISF: 40 - Active Insulin Time: 5 h - bolus wizard: on  If you do not enter all the carbs at the beginning of the meal, just do a correction when the postprandial sugars are high.  Please  continue Levothyroxine 112 mcg daily.  Take the thyroid hormone every day, with water, at least 30 minutes before breakfast, separated by at least 4 hours from: - acid reflux medications - calcium - iron - multivitamins  Please stop at the lab.  Please return in 4 months.  - we checked his HbA1c: 6.5% (stable) - advised to check sugars at different times of the day - 4x a day, rotating check times - advised for yearly eye exams >> he is UTD - he has peripheral neuropathy and his previous endocrinologist was refilling his amitriptyline and Neurontin.  However, he is seeing neurology and I suggested to have them refill these, if they agree. - I we will see him back in 3 to 4 months.  2.  Hyperlipidemia -Reviewed latest lipid panel from 10/2021: LDL at goal, triglycerides high, HDL low: Lab Results  Component Value Date   CHOL 125 11/04/2021   HDL 33.20 (L) 11/04/2021   LDLCALC 57 11/04/2021   TRIG 175.0 (H) 11/04/2021   CHOLHDL 4 11/04/2021  -He continues Lipitor 40 mg daily without side effects -Physical exam coming up within the next 3 months with PCP  3. Hypothyroidism -prev mngd by PCP - latest thyroid labs reviewed with pt. >> TSH suppressed: Lab Results  Component Value Date   TSH 0.12 (L) 07/06/2022  - he continues on LT4 112 mcg daily, decreased 07/2022 - pt feels good on this dose. - we discussed about taking the thyroid hormone every day, with water, >30 minutes before breakfast, separated by >4 hours from acid reflux medications, calcium, iron, multivitamins. Pt. is taking it correctly. - will check thyroid tests today: TSH and fT4 - If labs are abnormal, he will need to return for repeat TFTs in 1.5 months  Component     Latest Ref Rng 11/08/2022  Hemoglobin A1C     4.0 - 5.6 % 6.5 !   TSH     0.35 - 5.50 uIU/mL 0.55   T4,Free(Direct)     0.60 - 1.60 ng/dL 1.61   TFTs are now normal.  Carlus Pavlov, MD PhD [facility] Endocrinology

## 2022-11-11 ENCOUNTER — Encounter: Payer: Self-pay | Admitting: Internal Medicine

## 2022-11-12 DIAGNOSIS — E1042 Type 1 diabetes mellitus with diabetic polyneuropathy: Secondary | ICD-10-CM | POA: Diagnosis not present

## 2022-11-12 DIAGNOSIS — E1010 Type 1 diabetes mellitus with ketoacidosis without coma: Secondary | ICD-10-CM | POA: Diagnosis not present

## 2022-11-12 DIAGNOSIS — Z794 Long term (current) use of insulin: Secondary | ICD-10-CM | POA: Diagnosis not present

## 2022-11-12 DIAGNOSIS — Z9641 Presence of insulin pump (external) (internal): Secondary | ICD-10-CM | POA: Diagnosis not present

## 2022-11-23 ENCOUNTER — Other Ambulatory Visit: Payer: Self-pay | Admitting: Internal Medicine

## 2022-11-29 ENCOUNTER — Other Ambulatory Visit: Payer: Self-pay

## 2022-12-20 ENCOUNTER — Other Ambulatory Visit: Payer: Self-pay | Admitting: Internal Medicine

## 2022-12-23 NOTE — Telephone Encounter (Signed)
Just need some clarification.  Please confirm if he is still taking.  Per review of refills, I am not sure if he has been taking.  Also, overdue f/u appt.  Please schedule.

## 2022-12-24 ENCOUNTER — Other Ambulatory Visit: Payer: Self-pay

## 2022-12-24 MED ORDER — AMITRIPTYLINE HCL 25 MG PO TABS: 25.0000 mg | ORAL_TABLET | Freq: Every day | ORAL | 1 refills | Status: DC

## 2022-12-24 NOTE — Telephone Encounter (Signed)
Eric Hayden, can you help with this.  We have tried to close this chart.  Unable to close.  Do we need to get IT to close?  Thanks

## 2023-01-03 ENCOUNTER — Other Ambulatory Visit: Payer: Self-pay | Admitting: Internal Medicine

## 2023-01-05 DIAGNOSIS — G4733 Obstructive sleep apnea (adult) (pediatric): Secondary | ICD-10-CM | POA: Diagnosis not present

## 2023-01-05 DIAGNOSIS — E1142 Type 2 diabetes mellitus with diabetic polyneuropathy: Secondary | ICD-10-CM | POA: Diagnosis not present

## 2023-01-05 DIAGNOSIS — G43719 Chronic migraine without aura, intractable, without status migrainosus: Secondary | ICD-10-CM | POA: Diagnosis not present

## 2023-01-27 DIAGNOSIS — E1042 Type 1 diabetes mellitus with diabetic polyneuropathy: Secondary | ICD-10-CM | POA: Diagnosis not present

## 2023-01-27 DIAGNOSIS — Z9641 Presence of insulin pump (external) (internal): Secondary | ICD-10-CM | POA: Diagnosis not present

## 2023-01-27 DIAGNOSIS — Z794 Long term (current) use of insulin: Secondary | ICD-10-CM | POA: Diagnosis not present

## 2023-01-27 DIAGNOSIS — E1010 Type 1 diabetes mellitus with ketoacidosis without coma: Secondary | ICD-10-CM | POA: Diagnosis not present

## 2023-01-29 ENCOUNTER — Other Ambulatory Visit: Payer: Self-pay | Admitting: Internal Medicine

## 2023-01-31 ENCOUNTER — Other Ambulatory Visit: Payer: Self-pay | Admitting: Internal Medicine

## 2023-02-10 ENCOUNTER — Ambulatory Visit (INDEPENDENT_AMBULATORY_CARE_PROVIDER_SITE_OTHER): Payer: 59 | Admitting: Internal Medicine

## 2023-02-10 ENCOUNTER — Encounter: Payer: Self-pay | Admitting: Internal Medicine

## 2023-02-10 VITALS — BP 124/84 | HR 91 | Temp 98.2°F | Resp 17 | Ht 66.5 in | Wt 194.5 lb

## 2023-02-10 DIAGNOSIS — R42 Dizziness and giddiness: Secondary | ICD-10-CM | POA: Diagnosis not present

## 2023-02-10 DIAGNOSIS — Z125 Encounter for screening for malignant neoplasm of prostate: Secondary | ICD-10-CM

## 2023-02-10 DIAGNOSIS — G629 Polyneuropathy, unspecified: Secondary | ICD-10-CM | POA: Diagnosis not present

## 2023-02-10 DIAGNOSIS — R519 Headache, unspecified: Secondary | ICD-10-CM | POA: Diagnosis not present

## 2023-02-10 DIAGNOSIS — E7800 Pure hypercholesterolemia, unspecified: Secondary | ICD-10-CM | POA: Diagnosis not present

## 2023-02-10 DIAGNOSIS — E039 Hypothyroidism, unspecified: Secondary | ICD-10-CM | POA: Diagnosis not present

## 2023-02-10 DIAGNOSIS — E1040 Type 1 diabetes mellitus with diabetic neuropathy, unspecified: Secondary | ICD-10-CM

## 2023-02-10 DIAGNOSIS — G4733 Obstructive sleep apnea (adult) (pediatric): Secondary | ICD-10-CM | POA: Diagnosis not present

## 2023-02-10 DIAGNOSIS — K900 Celiac disease: Secondary | ICD-10-CM | POA: Diagnosis not present

## 2023-02-10 DIAGNOSIS — K219 Gastro-esophageal reflux disease without esophagitis: Secondary | ICD-10-CM | POA: Diagnosis not present

## 2023-02-10 DIAGNOSIS — Z0000 Encounter for general adult medical examination without abnormal findings: Secondary | ICD-10-CM

## 2023-02-10 LAB — CBC WITH DIFFERENTIAL/PLATELET
Basophils Absolute: 0.1 10*3/uL (ref 0.0–0.1)
Basophils Relative: 1 % (ref 0.0–3.0)
Eosinophils Absolute: 0.8 10*3/uL — ABNORMAL HIGH (ref 0.0–0.7)
Eosinophils Relative: 7.8 % — ABNORMAL HIGH (ref 0.0–5.0)
HCT: 42.8 % (ref 39.0–52.0)
Hemoglobin: 14.4 g/dL (ref 13.0–17.0)
Lymphocytes Relative: 25.8 % (ref 12.0–46.0)
Lymphs Abs: 2.7 10*3/uL (ref 0.7–4.0)
MCHC: 33.8 g/dL (ref 30.0–36.0)
MCV: 84.3 fl (ref 78.0–100.0)
Monocytes Absolute: 0.6 10*3/uL (ref 0.1–1.0)
Monocytes Relative: 6.2 % (ref 3.0–12.0)
Neutro Abs: 6.2 10*3/uL (ref 1.4–7.7)
Neutrophils Relative %: 59.2 % (ref 43.0–77.0)
Platelets: 307 10*3/uL (ref 150.0–400.0)
RBC: 5.07 Mil/uL (ref 4.22–5.81)
RDW: 13.8 % (ref 11.5–15.5)
WBC: 10.5 10*3/uL (ref 4.0–10.5)

## 2023-02-10 LAB — BASIC METABOLIC PANEL
BUN: 12 mg/dL (ref 6–23)
CO2: 25 mEq/L (ref 19–32)
Calcium: 9.4 mg/dL (ref 8.4–10.5)
Chloride: 106 mEq/L (ref 96–112)
Creatinine, Ser: 1.18 mg/dL (ref 0.40–1.50)
GFR: 74.08 mL/min (ref >60.00)
Glucose, Bld: 202 mg/dL — ABNORMAL HIGH (ref 70–99)
Potassium: 4.4 mEq/L (ref 3.5–5.1)
Sodium: 138 mEq/L (ref 135–145)

## 2023-02-10 LAB — HM DIABETES FOOT EXAM

## 2023-02-10 LAB — HEPATIC FUNCTION PANEL
ALT: 21 U/L (ref 0–53)
AST: 18 U/L (ref 0–37)
Albumin: 4 g/dL (ref 3.5–5.2)
Alkaline Phosphatase: 119 U/L — ABNORMAL HIGH (ref 39–117)
Bilirubin, Direct: 0.1 mg/dL (ref 0.0–0.3)
Total Bilirubin: 0.6 mg/dL (ref 0.2–1.2)
Total Protein: 7 g/dL (ref 6.0–8.3)

## 2023-02-10 LAB — LIPID PANEL
Cholesterol: 148 mg/dL (ref 0–200)
HDL: 32.4 mg/dL — ABNORMAL LOW (ref >39.00)
LDL Cholesterol: 83 mg/dL (ref 0–99)
NonHDL: 115.9
Total CHOL/HDL Ratio: 5
Triglycerides: 164 mg/dL — ABNORMAL HIGH (ref 0.0–149.0)
VLDL: 32.8 mg/dL (ref 0.0–40.0)

## 2023-02-10 LAB — MICROALBUMIN / CREATININE URINE RATIO
Creatinine,U: 205.3 mg/dL
Microalb Creat Ratio: 0.3 mg/g (ref 0.0–30.0)
Microalb, Ur: <0.7 mg/dL (ref 0.0–1.9)

## 2023-02-10 LAB — PSA: PSA: 0.91 ng/mL (ref 0.10–4.00)

## 2023-02-10 LAB — TSH: TSH: 1.02 u[IU]/mL (ref 0.35–5.50)

## 2023-02-10 MED ORDER — OMEPRAZOLE 20 MG PO CPDR: 20.0000 mg | DELAYED_RELEASE_CAPSULE | Freq: Every day | ORAL | 1 refills | Status: DC

## 2023-02-10 MED ORDER — CLOTRIMAZOLE-BETAMETHASONE 1-0.05 % EX CREA: 1.0000 | TOPICAL_CREAM | Freq: Two times a day (BID) | CUTANEOUS | 0 refills | Status: AC

## 2023-02-10 NOTE — Progress Notes (Signed)
Subjective:    Patient ID: Eric Hayden, male    DOB: Feb 24, 1976, 47 y.o.   MRN: [id number]  Patient here for  Chief Complaint  Patient presents with   Annual Exam    HPI Here for physical exam.  Type I diabetes - insulin pump. Last A1c 6.5. seeing neurology - intractable chronic migraine with associated vestibular symptoms. Had f/u 01/05/23 - recommended to continue cephaly, topamax and cyproheptadine. Received botox injections. Insurance coverage issues.  Order placed for nerivio device. Off candesartan. Has been referred to [facility].  Appt scheduled in January.  Also having vertigo associated with above.  No chest pain or sob reported.  No abdominal pain or bowel change reported. Rash - discussed trial of lotrisone.    Past Medical History:  Diagnosis Date   Asthma    Cancer (HCC)    appendiceal   Chicken pox    GERD (gastroesophageal reflux disease)    Heart murmur    Hx of oral aphthous ulcers    Hypothyroidism    Migraines    Sleep apnea    Type 1 diabetes mellitus (HCC)    Past Surgical History:  Procedure Laterality Date   APPENDECTOMY  2012   COLONOSCOPY WITH PROPOFOL N/A 10/15/2019   Procedure: COLONOSCOPY WITH PROPOFOL;  Surgeon: Wyline Mood, MD;  Location: [facility] ENDOSCOPY;  Service: Gastroenterology;  Laterality: N/A;   ESOPHAGOGASTRODUODENOSCOPY (EGD) WITH PROPOFOL N/A 10/15/2019   Procedure: ESOPHAGOGASTRODUODENOSCOPY (EGD) WITH PROPOFOL;  Surgeon: Wyline Mood, MD;  Location: [facility] ENDOSCOPY;  Service: Gastroenterology;  Laterality: N/A;   TONSILLECTOMY AND ADENOIDECTOMY  2007   UPPER GI ENDOSCOPY     Family History  Problem Relation Age of Onset   Arthritis Maternal Grandmother    Hyperlipidemia Maternal Grandmother    Heart disease Maternal Grandmother    Hypertension Maternal Grandmother    Diabetes Maternal Grandmother        type 2   Heart attack Maternal Grandmother    Arthritis Maternal Grandfather    Heart disease Maternal Grandfather     Hypertension Maternal Grandfather    Heart attack Maternal Grandfather    Arthritis Mother    Hyperlipidemia Mother    Hypertension Mother    Diabetes Mother        type 2   Hyperlipidemia Father    Arthritis Paternal Grandmother    Arthritis Paternal Grandfather    Heart attack Paternal Aunt    Social History   Socioeconomic History   Marital status: Married    Spouse name: Not on file   Number of children: 2   Years of education: Not on file   Highest education level: Not on file  Occupational History   Occupation: Education officer, environmental, [employer]  Tobacco Use   Smoking status: Never   Smokeless tobacco: Never  Vaping Use   Vaping status: Never Used  Substance and Sexual Activity   Alcohol use: No   Drug use: No   Sexual activity: Not on file  Other Topics Concern   Not on file  Social History Narrative   Not on file   Social Determinants of Health   Financial Resource Strain: Not on file  Food Insecurity: Not on file  Transportation Needs: Not on file  Physical Activity: Not on file  Stress: Not on file  Social Connections: Not on file     Review of Systems  Constitutional:  Negative for appetite change and unexpected weight change.  HENT:  Negative  for congestion, sinus pressure and sore throat.   Eyes:  Negative for pain and visual disturbance.  Respiratory:  Negative for cough, chest tightness and shortness of breath.   Cardiovascular:  Negative for chest pain and palpitations.  Gastrointestinal:  Negative for abdominal pain, diarrhea, nausea and vomiting.  Genitourinary:  Negative for difficulty urinating and dysuria.  Musculoskeletal:  Negative for joint swelling and myalgias.  Skin:  Negative for color change and wound.  Neurological:  Positive for dizziness and headaches.  Hematological:  Negative for adenopathy. Does not bruise/bleed easily.  Psychiatric/Behavioral:  Negative for agitation and dysphoric mood.        Objective:     BP  124/84   Pulse 91   Temp 98.2 F (36.8 C) (Oral)   Resp 17   Ht 5' 6.5" (1.689 m)   Wt 194 lb 8 oz (88.2 kg)   SpO2 97%   BMI 30.92 kg/m  Wt Readings from Last 3 Encounters:  02/10/23 194 lb 8 oz (88.2 kg)  11/08/22 194 lb 9.6 oz (88.3 kg)  07/06/22 195 lb 3.2 oz (88.5 kg)    Physical Exam Constitutional:      General: He is not in acute distress.    Appearance: Normal appearance. He is well-developed.  HENT:     Head: Normocephalic and atraumatic.     Right Ear: External ear normal.     Left Ear: External ear normal.  Eyes:     General: No scleral icterus.       Right eye: No discharge.        Left eye: No discharge.     Conjunctiva/sclera: Conjunctivae normal.  Neck:     Thyroid: No thyromegaly.  Cardiovascular:     Rate and Rhythm: Normal rate and regular rhythm.  Pulmonary:     Effort: No respiratory distress.     Breath sounds: Normal breath sounds. No wheezing.  Abdominal:     General: Bowel sounds are normal.     Palpations: Abdomen is soft.     Tenderness: There is no abdominal tenderness.  Musculoskeletal:        General: No swelling or tenderness.     Cervical back: Neck supple. No tenderness.  Lymphadenopathy:     Cervical: No cervical adenopathy.  Skin:    Findings: No erythema or rash.  Neurological:     Mental Status: He is alert and oriented to person, place, and time.  Psychiatric:        Mood and Affect: Mood normal.        Behavior: Behavior normal.      Outpatient Encounter Medications as of 02/10/2023  Medication Sig   albuterol (VENTOLIN HFA) 108 (90 Base) MCG/ACT inhaler Inhale 2 puffs into the lungs every 6 (six) hours as needed for wheezing or shortness of breath.   amitriptyline (ELAVIL) 50 MG tablet Take 50 mg by mouth daily.   atorvastatin (LIPITOR) 20 MG tablet Take 1 tablet (20 mg total) by mouth daily. NEEDS APPT FOR ADDITIONAL REFILLS   Blood Glucose Monitoring Suppl (ONETOUCH VERIO FLEX SYSTEM) w/Device KIT See admin  instructions.   Cholecalciferol (VITAMIN D3 PO) Take by mouth.   clotrimazole-betamethasone (LOTRISONE) cream Apply 1 Application topically 2 (two) times daily.   Continuous Glucose Transmitter (DEXCOM G6 TRANSMITTER) MISC USE FOR 3 MONTHS   CONTOUR NEXT TEST test strip    cyproheptadine (PERIACTIN) 4 MG tablet Take 4 mg by mouth 2 (two) times daily.   folic acid (FOLVITE) 800  MCG tablet Take by mouth.   gabapentin (NEURONTIN) 300 MG capsule TAKE 1 CAPSULE TWICE DAILY   insulin lispro (HUMALOG) 100 UNIT/ML injection Inject into the skin daily up to 160 units a day as directed.   levothyroxine (SYNTHROID) 112 MCG tablet TAKE ONE TABLET BY MOUTH EVERY DAY   Probiotic Product (PROBIOTIC PO) Take by mouth.   topiramate (TOPAMAX) 25 MG tablet TAKE 1 TO 2 TABLETS BY MOUTH EVERY DAY   [DISCONTINUED] amitriptyline (ELAVIL) 25 MG tablet Take 1 tablet (25 mg total) by mouth at bedtime. (Patient taking differently: Take 50 mg by mouth at bedtime.)   [DISCONTINUED] candesartan (ATACAND) 4 MG tablet Take 4 mg by mouth daily.   [DISCONTINUED] omeprazole (PRILOSEC) 20 MG capsule TAKE 1 CAPSULE EVERY DAY   omeprazole (PRILOSEC) 20 MG capsule Take 1 capsule (20 mg total) by mouth daily.   [DISCONTINUED] Insulin Aspart, w/Niacinamide, (FIASP) 100 UNIT/ML SOLN Inject up to 170 units per day   [DISCONTINUED] insulin lispro (HUMALOG) 100 UNIT/ML injection Inject under the skin up to 160 units a day as advised   No facility-administered encounter medications on file as of 02/10/2023.     Lab Results  Component Value Date   WBC 10.5 02/10/2023   HGB 14.4 02/10/2023   HCT 42.8 02/10/2023   PLT 307.0 02/10/2023   GLUCOSE 202 (H) 02/10/2023   CHOL 148 02/10/2023   TRIG 164.0 (H) 02/10/2023   HDL 32.40 (L) 02/10/2023   LDLCALC 83 02/10/2023   ALT 21 02/10/2023   AST 18 02/10/2023   NA 138 02/10/2023   K 4.4 02/10/2023   CL 106 02/10/2023   CREATININE 1.18 02/10/2023   BUN 12 02/10/2023   CO2 25  02/10/2023   TSH 1.02 02/10/2023   PSA 0.91 02/10/2023   HGBA1C 6.5 (A) 11/08/2022   MICROALBUR <0.7 02/10/2023    No results found.     Assessment & Plan:  Routine general medical examination at a health care facility  Health care maintenance Assessment & Plan: Physical today 02/10/23.  Colonoscopy 10/15/19 - celiac and removal of polyp (tubular adenoma).  Recommended f/u colonoscopy in 5 years. Check psa with next labs.     Type 1 diabetes mellitus with diabetic neuropathy [facility]) Assessment & Plan: Seeing Dr Elvera Lennox now.  Last a1c 6.5. low carb diet and exercise.  Follow met b and A1c.   Orders: -     Basic metabolic panel -     Microalbumin / creatinine urine ratio  Hypercholesterolemia Assessment & Plan: On lipitor. Low cholesterol diet and exercise.  Follow lipid panel and liver function tests.    Orders: -     CBC with Differential/Platelet -     Hepatic function panel -     Lipid panel  Acquired hypothyroidism Assessment & Plan: On thyroid replacement.  Follow tsh.   Orders: -     TSH  Prostate cancer screening -     PSA  Celiac disease Assessment & Plan: If watches diet - stable.    Dizziness Assessment & Plan: Persistent intermittent issues with headache and dizziness.  Seeing neurology.  MRI and EEG unrevealing.  Has tried multiple medications as outlined.  Continue f/u with neurology.    Gastroesophageal reflux disease, unspecified whether esophagitis present Assessment & Plan: Continue prilosec.     Acute nonintractable headache, unspecified headache type Assessment & Plan: Persistent issue with increased headache as outlined.  Has tried various medications as outlined.  Seeing neurology. Insurance coverage issues with  botox injection, etc.  Refer for medication management to see if can get any help with medication coverage.   Orders: -     AMB Referral to Pharmacy Medication Management  Obstructive sleep apnea Assessment & Plan: CPAP.     Neuropathy Assessment & Plan: Continues on gabapentin and amitriptyline.  Stable.    Other orders -     Omeprazole; Take 1 capsule (20 mg total) by mouth daily.  Dispense: 90 capsule; Refill: 1 -     Clotrimazole-Betamethasone; Apply 1 Application topically 2 (two) times daily.  Dispense: 30 g; Refill: 0     Dale Silbernagel, MD

## 2023-02-10 NOTE — Assessment & Plan Note (Addendum)
Physical today 02/10/23.  Colonoscopy 10/15/19 - celiac and removal of polyp (tubular adenoma).  Recommended f/u colonoscopy in 5 years. Check psa with next labs.

## 2023-02-11 ENCOUNTER — Telehealth: Payer: Self-pay

## 2023-02-11 DIAGNOSIS — R7989 Other specified abnormal findings of blood chemistry: Secondary | ICD-10-CM

## 2023-02-11 NOTE — Telephone Encounter (Signed)
-----   Message from [facility] sent at 02/11/2023  5:48 AM EDT ----- Notify - cholesterol levels are ok.  Triglycerides increased slightly.  Continue low carb diet.  Exercise will increase good cholesterol. PSA wnl.  Hgb, thyroid test and kidney function tests are ok.  Alkaline phosphatase - increased slightly, but remainder of liver panel wnl.  We will follow.  Recheck liver panel in 4 weeks.

## 2023-02-11 NOTE — Telephone Encounter (Signed)
Pt called back and I read the message to him and he has scheduled his lab appointment for 8/9

## 2023-02-11 NOTE — Telephone Encounter (Signed)
Lab ordered.

## 2023-02-11 NOTE — Addendum Note (Signed)
Addended by: Rita Ohara D on: 02/11/2023 02:32 PM   Modules accepted: Orders

## 2023-02-15 ENCOUNTER — Telehealth: Payer: Self-pay

## 2023-02-15 ENCOUNTER — Encounter: Payer: Self-pay | Admitting: Internal Medicine

## 2023-02-15 NOTE — Assessment & Plan Note (Signed)
 On lipitor.  Low cholesterol diet and exercise.  Follow lipid panel and liver function tests.   

## 2023-02-15 NOTE — Assessment & Plan Note (Signed)
Persistent intermittent issues with headache and dizziness.  Seeing neurology.  MRI and EEG unrevealing.  Has tried multiple medications as outlined.  Continue f/u with neurology.

## 2023-02-15 NOTE — Assessment & Plan Note (Signed)
On thyroid replacement.  Follow tsh.  

## 2023-02-15 NOTE — Assessment & Plan Note (Signed)
If watches diet - stable.

## 2023-02-15 NOTE — Assessment & Plan Note (Signed)
 Continues on gabapentin and amitriptyline.  Stable.

## 2023-02-15 NOTE — Assessment & Plan Note (Signed)
 Continue prilosec

## 2023-02-15 NOTE — Assessment & Plan Note (Signed)
Persistent issue with increased headache as outlined.  Has tried various medications as outlined.  Seeing neurology. Insurance coverage issues with botox injection, etc.  Refer for medication management to see if can get any help with medication coverage.

## 2023-02-15 NOTE — Assessment & Plan Note (Signed)
CPAP.  

## 2023-02-15 NOTE — Progress Notes (Signed)
   Care Guide Note  02/15/2023 Name: PEARLEY BARANEK MRN: [id number] DOB: 1975-11-15  Referred by: Dale Batrez, MD Reason for referral : Care Coordination (Outreach to schedule with Pharm d )   MINDY BEHNKEN is a 47 y.o. year old male who is a primary care patient of Dale Ertle, MD. [facility] was referred to the pharmacist for assistance related to  Headaches .    Successful contact was made with the patient to discuss pharmacy services including being ready for the pharmacist to call at least 5 minutes before the scheduled appointment time, to have medication bottles and any blood sugar or blood pressure readings ready for review. The patient agreed to meet with the pharmacist via with the pharmacist via telephone visit on (date/time).  03/18/2023  Penne Lash, RMA Care Guide [facility]  [place], [place] [address] Direct Dial: [phone] [email or web address]

## 2023-02-15 NOTE — Assessment & Plan Note (Signed)
Seeing Dr Elvera Lennox now.  Last a1c 6.5. low carb diet and exercise.  Follow met b and A1c.

## 2023-02-17 DIAGNOSIS — G43719 Chronic migraine without aura, intractable, without status migrainosus: Secondary | ICD-10-CM | POA: Diagnosis not present

## 2023-02-23 ENCOUNTER — Other Ambulatory Visit: Payer: Self-pay | Admitting: Internal Medicine

## 2023-03-02 ENCOUNTER — Other Ambulatory Visit: Payer: Self-pay | Admitting: Internal Medicine

## 2023-03-09 ENCOUNTER — Other Ambulatory Visit: Payer: Self-pay | Admitting: Internal Medicine

## 2023-03-11 ENCOUNTER — Other Ambulatory Visit (INDEPENDENT_AMBULATORY_CARE_PROVIDER_SITE_OTHER): Payer: 59

## 2023-03-11 ENCOUNTER — Ambulatory Visit
Admission: RE | Admit: 2023-03-11 | Discharge: 2023-03-11 | Disposition: A | Discharge status: Home / Self Care | Payer: 59 | Source: Ambulatory Visit | Attending: Family Medicine | Admitting: Family Medicine

## 2023-03-11 VITALS — BP 134/84 | HR 88 | Temp 97.7°F | Resp 18

## 2023-03-11 DIAGNOSIS — E1040 Type 1 diabetes mellitus with diabetic neuropathy, unspecified: Secondary | ICD-10-CM | POA: Diagnosis not present

## 2023-03-11 DIAGNOSIS — R7989 Other specified abnormal findings of blood chemistry: Secondary | ICD-10-CM

## 2023-03-11 DIAGNOSIS — L237 Allergic contact dermatitis due to plants, except food: Secondary | ICD-10-CM | POA: Diagnosis not present

## 2023-03-11 DIAGNOSIS — G4733 Obstructive sleep apnea (adult) (pediatric): Secondary | ICD-10-CM | POA: Diagnosis not present

## 2023-03-11 LAB — HEPATIC FUNCTION PANEL
ALT: 24 U/L (ref 0–53)
AST: 17 U/L (ref 0–37)
Albumin: 4 g/dL (ref 3.5–5.2)
Alkaline Phosphatase: 102 U/L (ref 39–117)
Bilirubin, Direct: 0 mg/dL (ref 0.0–0.3)
Total Bilirubin: 0.4 mg/dL (ref 0.2–1.2)
Total Protein: 7.2 g/dL (ref 6.0–8.3)

## 2023-03-11 MED ORDER — DEXAMETHASONE SODIUM PHOSPHATE 10 MG/ML IJ SOLN
10.0000 mg | Freq: Once | INTRAMUSCULAR | Status: AC
  Administered 2023-03-11: 10 mg via INTRAMUSCULAR

## 2023-03-11 MED ORDER — TRIAMCINOLONE ACETONIDE 0.1 % EX CREA
1.0000 | TOPICAL_CREAM | Freq: Two times a day (BID) | CUTANEOUS | 0 refills | Status: DC | PRN
Start: 2023-03-11 — End: 2023-09-30

## 2023-03-11 NOTE — ED Triage Notes (Signed)
Pt presents with red itchy rash on right arm and abdomin, states he was working outside in possible poison ivy 2 weeks ago and rash showed up 2-3 days after that. Tried Cortizone, calamine lotion, benadryl with no relief.

## 2023-03-11 NOTE — Discharge Instructions (Signed)
You received a long-acting steroid injection due to the reaction and poison ivy contact.  Monitor blood sugars over the next 24 to 48 hours as they may increase due to effect of medication.  Experiencing any readings greater than 350 contact your primary care provider or if you have a sliding scale adjust insulin dose according to sliding scale.

## 2023-03-11 NOTE — ED Provider Notes (Signed)
Eric Hayden    CSN: [id number] Arrival date & time: 03/11/23  1002      History   Chief Complaint Chief Complaint  Patient presents with   Poison Ivy    Entered by patient    HPI Eric Hayden is a 47 y.o. male.   HPI Patient presents for evaluation of a red itchy pruritic rash initially erupting on the right arm 2 weeks ago and has now expanding to the involve the lower right abdomen. The rash is pruritic and showed up 2 days after removing overgrown brush at his church. He has attempted to treat rash with topical Cortizone, calamine,benadryl without relief of symptoms.  Past Medical History:  Diagnosis Date   Asthma    Cancer (HCC)    appendiceal   Chicken pox    GERD (gastroesophageal reflux disease)    Heart murmur    Hx of oral aphthous ulcers    Hypothyroidism    Migraines    Sleep apnea    Type 1 diabetes mellitus (HCC)     Patient Active Problem List   Diagnosis Date Noted   Mixed hyperlipidemia 03/01/2022   DM type 1 with diabetic peripheral neuropathy (HCC) 11/03/2021   Earache 07/08/2021   Celiac disease 07/07/2021   History of COVID-19 03/02/2021   Altered level of consciousness 05/22/2017   Syncope 05/17/2017   Cough 05/07/2016   Dizziness 04/12/2016   Elevated alkaline phosphatase level 08/17/2015   Health care maintenance 12/09/2014   Chest pain 12/09/2014   Headache 05/20/2014   Abdominal pain 05/20/2014   Back pain 08/19/2013   Type 1 diabetes mellitus (HCC) 11/18/2012   Hypercholesterolemia 11/18/2012   Neuropathy 11/18/2012   Obstructive sleep apnea 11/18/2012   GERD (gastroesophageal reflux disease) 11/18/2012   Hypothyroidism 11/18/2012    Past Surgical History:  Procedure Laterality Date   APPENDECTOMY  2012   COLONOSCOPY WITH PROPOFOL N/A 10/15/2019   Procedure: COLONOSCOPY WITH PROPOFOL;  Surgeon: Wyline Mood, MD;  Location: [facility] ENDOSCOPY;  Service: Gastroenterology;  Laterality: N/A;   ESOPHAGOGASTRODUODENOSCOPY  (EGD) WITH PROPOFOL N/A 10/15/2019   Procedure: ESOPHAGOGASTRODUODENOSCOPY (EGD) WITH PROPOFOL;  Surgeon: Wyline Mood, MD;  Location: [facility] ENDOSCOPY;  Service: Gastroenterology;  Laterality: N/A;   TONSILLECTOMY AND ADENOIDECTOMY  2007   UPPER GI ENDOSCOPY         Home Medications    Prior to Admission medications   Medication Sig Start Date End Date Taking? Authorizing Provider  amitriptyline (ELAVIL) 50 MG tablet Take 50 mg by mouth daily. 01/05/23  Yes [provider]  atorvastatin (LIPITOR) 20 MG tablet TAKE 1 TABLET BY MOUTH DAILY 02/23/23  Yes Dale Heymann, MD  Blood Glucose Monitoring Suppl (ONETOUCH VERIO FLEX SYSTEM) w/Device KIT See admin instructions. 12/10/19  Yes [provider]  Cholecalciferol (VITAMIN D3 PO) Take by mouth.   Yes [provider]  clotrimazole-betamethasone (LOTRISONE) cream Apply 1 Application topically 2 (two) times daily. 02/10/23  Yes Dale Boddy, MD  Continuous Glucose Sensor (DEXCOM G6 SENSOR) MISC APPLY 1 SENSOR EVERY 10 DAYS 03/02/23  Yes Carlus Pavlov, MD  Continuous Glucose Transmitter (DEXCOM G6 TRANSMITTER) MISC USE FOR 3 MONTHS 01/31/23  Yes Carlus Pavlov, MD  CONTOUR NEXT TEST test strip  12/12/17  Yes [provider]  cyproheptadine (PERIACTIN) 4 MG tablet Take 4 mg by mouth 2 (two) times daily.   Yes [provider]  folic acid (FOLVITE) 800 MCG tablet Take by mouth.   Yes [provider]  gabapentin (  NEURONTIN) 300 MG capsule TAKE 1 CAPSULE TWICE DAILY 03/10/23  Yes Dale Parran, MD  insulin lispro (HUMALOG) 100 UNIT/ML injection Inject into the skin daily up to 160 units a day as directed. 10/28/22  Yes   levothyroxine (SYNTHROID) 112 MCG tablet TAKE ONE TABLET BY MOUTH EVERY DAY 03/02/23  Yes Carlus Pavlov, MD  omeprazole (PRILOSEC) 20 MG capsule Take 1 capsule (20 mg total) by mouth daily. 02/10/23  Yes Dale Musolino, MD  Probiotic Product (PROBIOTIC PO) Take by mouth.   Yes  [provider]  topiramate (TOPAMAX) 25 MG tablet TAKE 1 TO 2 TABLETS BY MOUTH EVERY DAY 04/10/21  Yes Dale Fossett, MD  triamcinolone cream (KENALOG) 0.1 % Apply 1 Application topically 2 (two) times daily as needed (Apply to affected area). 03/11/23  Yes Bing Neighbors, NP  albuterol (VENTOLIN HFA) 108 (90 Base) MCG/ACT inhaler Inhale 2 puffs into the lungs every 6 (six) hours as needed for wheezing or shortness of breath. 02/04/22   Dale Bergen, MD    Family History Family History  Problem Relation Age of Onset   Arthritis Maternal Grandmother    Hyperlipidemia Maternal Grandmother    Heart disease Maternal Grandmother    Hypertension Maternal Grandmother    Diabetes Maternal Grandmother        type 2   Heart attack Maternal Grandmother    Arthritis Maternal Grandfather    Heart disease Maternal Grandfather    Hypertension Maternal Grandfather    Heart attack Maternal Grandfather    Arthritis Mother    Hyperlipidemia Mother    Hypertension Mother    Diabetes Mother        type 2   Hyperlipidemia Father    Arthritis Paternal Grandmother    Arthritis Paternal Grandfather    Heart attack Paternal Aunt     Social History Social History   Tobacco Use   Smoking status: Never   Smokeless tobacco: Never  Vaping Use   Vaping status: Never Used  Substance Use Topics   Alcohol use: No   Drug use: No     Allergies   Augmentin [amoxicillin-pot clavulanate] and Levaquin [levofloxacin in d5w]  Review of Systems Review of Systems Pertinent negatives listed in HPI  Physical Exam Triage Vital Signs ED Triage Vitals  Encounter Vitals Group     BP 03/11/23 1014 134/84     Systolic BP Percentile --      Diastolic BP Percentile --      Pulse Rate 03/11/23 1014 88     Resp 03/11/23 1014 18     Temp 03/11/23 1014 97.7 F (36.5 C)     Temp src --      SpO2 03/11/23 1014 96 %     Weight --      Height --      Head Circumference --      Peak Flow --       Pain Score 03/11/23 1017 0     Pain Loc --      Pain Education --      Exclude from Growth Chart --    No data found.  Updated Vital Signs BP 134/84   Pulse 88   Temp 97.7 F (36.5 C)   Resp 18   SpO2 96%   Visual Acuity Right Eye Distance:   Left Eye Distance:   Bilateral Distance:    Right Eye Near:   Left Eye Near:    Bilateral Near:     Physical  Exam Vitals and nursing note reviewed.  Constitutional:      Appearance: Normal appearance.  HENT:     Head: Normocephalic and atraumatic.  Eyes:     Extraocular Movements: Extraocular movements intact.     Conjunctiva/sclera: Conjunctivae normal.     Pupils: Pupils are equal, round, and reactive to light.  Cardiovascular:     Rate and Rhythm: Normal rate and regular rhythm.  Pulmonary:     Effort: Pulmonary effort is normal.     Breath sounds: Normal breath sounds.  Musculoskeletal:        General: Normal range of motion.     Cervical back: Normal range of motion and neck supple.  Skin:    General: Skin is warm and dry.     Findings: Erythema and rash present. Rash is papular.  Neurological:     General: No focal deficit present.     Mental Status: He is alert.      UC Treatments / Results  Labs (all labs ordered are listed, but only abnormal results are displayed) Labs Reviewed - No data to display  EKG   Radiology No results found.  Procedures Procedures (including critical care time)  Medications Ordered in UC Medications  dexamethasone (DECADRON) injection 10 mg (has no administration in time range)    Initial Impression / Assessment and Plan / UC Course  I have reviewed the triage vital signs and the nursing notes.  Pertinent labs & imaging results that were available during my care of the patient were reviewed by me and considered in my medical decision making (see chart for details).    Contact dermatitis patient treated with Decadron 10 mg IM given here in clinic today.  Patient will  continue home management of rash with topical triamcinolone cream to be applied twice daily as needed.  Encouraged to monitor blood sugars as he has been treated with long-acting steroid therefore his blood sugars may increase over the next 24 to 48 hours.  Patient encouraged to adjust insulin sliding scale based on blood sugar readings.  Return as needed. Final Clinical Impressions(s) / UC Diagnoses   Final diagnoses:  Contact dermatitis due to poison ivy  Type 1 diabetes mellitus with diabetic neuropathy (HCC)     Discharge Instructions      You received a long-acting steroid injection due to the reaction and poison ivy contact.  Monitor blood sugars over the next 24 to 48 hours as they may increase due to effect of medication.  Experiencing any readings greater than 350 contact your primary care provider or if you have a sliding scale adjust insulin dose according to sliding scale.     ED Prescriptions     Medication Sig Dispense Auth. Provider   triamcinolone cream (KENALOG) 0.1 % Apply 1 Application topically 2 (two) times daily as needed (Apply to affected area). 454 g Bing Neighbors, NP      PDMP not reviewed this encounter.   Bing Neighbors, NP 03/11/23 1108

## 2023-03-18 ENCOUNTER — Telehealth: Payer: Self-pay | Admitting: Pharmacist

## 2023-03-18 ENCOUNTER — Other Ambulatory Visit: Payer: 59 | Admitting: Pharmacist

## 2023-03-18 NOTE — Progress Notes (Unsigned)
Attempted to contact patient for scheduled appointment for medication management. Left HIPAA compliant message for patient to return my call at their convenience.   Catie T. Harper, PharmD, BCACP, CPP Clinical Pharmacist [facility] [phone]  

## 2023-03-21 ENCOUNTER — Ambulatory Visit: Payer: 59 | Admitting: Internal Medicine

## 2023-04-08 ENCOUNTER — Encounter: Payer: Self-pay | Admitting: Internal Medicine

## 2023-04-08 DIAGNOSIS — E1010 Type 1 diabetes mellitus with ketoacidosis without coma: Secondary | ICD-10-CM | POA: Diagnosis not present

## 2023-04-08 DIAGNOSIS — E1042 Type 1 diabetes mellitus with diabetic polyneuropathy: Secondary | ICD-10-CM | POA: Diagnosis not present

## 2023-04-08 DIAGNOSIS — Z9641 Presence of insulin pump (external) (internal): Secondary | ICD-10-CM | POA: Diagnosis not present

## 2023-04-08 DIAGNOSIS — Z794 Long term (current) use of insulin: Secondary | ICD-10-CM | POA: Diagnosis not present

## 2023-04-08 NOTE — Telephone Encounter (Signed)
Can we use appt from July 2024 since this was his CPE and then arrange TB skin test if needed?

## 2023-04-11 NOTE — Telephone Encounter (Signed)
See me about this. Reviewed form and completed what I could.  It does appear will need TB skin test.   Can inform about tetanus.

## 2023-04-11 NOTE — Telephone Encounter (Signed)
Patient aware of below and scheduled for TB skin test placement and reading

## 2023-04-11 NOTE — Telephone Encounter (Signed)
Form printed for signature

## 2023-04-16 DIAGNOSIS — Z23 Encounter for immunization: Secondary | ICD-10-CM | POA: Diagnosis not present

## 2023-04-16 DIAGNOSIS — R0981 Nasal congestion: Secondary | ICD-10-CM | POA: Diagnosis not present

## 2023-04-16 DIAGNOSIS — R030 Elevated blood-pressure reading, without diagnosis of hypertension: Secondary | ICD-10-CM | POA: Diagnosis not present

## 2023-04-16 DIAGNOSIS — Z6831 Body mass index (BMI) 31.0-31.9, adult: Secondary | ICD-10-CM | POA: Diagnosis not present

## 2023-04-16 DIAGNOSIS — J0110 Acute frontal sinusitis, unspecified: Secondary | ICD-10-CM | POA: Diagnosis not present

## 2023-04-18 ENCOUNTER — Ambulatory Visit (INDEPENDENT_AMBULATORY_CARE_PROVIDER_SITE_OTHER): Payer: 59

## 2023-04-18 DIAGNOSIS — Z111 Encounter for screening for respiratory tuberculosis: Secondary | ICD-10-CM

## 2023-04-18 NOTE — Progress Notes (Signed)
PPD placed on right forearm.  Pt tolerated procedure well.  Updated pt's immunizations had flu and Tdap 04/16/23 at pharmacy. Pt asked if his form can be completed and ready when he comes Wednesday to have PPD read.    PPD Placement note Eric Hayden, 47 y.o. male is here today for placement of PPD test Reason for PPD test: Employment Pt taken PPD test before: yes Verified in allergy area and with patient that they are not allergic to the products PPD is made of (Phenol or Tween). Yes Is patient taking any oral or IV steroid medication now or have they taken it in the last month? no Has the patient ever received the BCG vaccine?: no Has the patient been in recent contact with anyone known or suspected of having active TB disease?: no      Date of exposure (if applicable): NA      Name of person they were exposed to (if applicable): NA Patient's Country of origin?: NA O: Alert and oriented in NAD. P:  PPD placed on 04/18/2023.  Patient advised to return for reading within 48-72 hours.

## 2023-04-18 NOTE — Telephone Encounter (Signed)
Please update immunizations in our chart and on form.  Will document TB skin test results and give form to him Wednesday.  Please confirm we have the form.

## 2023-04-20 ENCOUNTER — Ambulatory Visit: Payer: 59

## 2023-04-20 NOTE — Progress Notes (Signed)
PPD Reading Note PPD read and results entered in EpicCare. Result: 0 mm induration. Interpretation: NEGATIVE.  PPD read. Form completed and given to patient at time of visit.

## 2023-04-25 ENCOUNTER — Ambulatory Visit: Payer: 59 | Admitting: Internal Medicine

## 2023-05-04 ENCOUNTER — Ambulatory Visit: Payer: 59 | Admitting: Dermatology

## 2023-05-12 ENCOUNTER — Encounter: Payer: Self-pay | Admitting: Internal Medicine

## 2023-05-12 ENCOUNTER — Ambulatory Visit: Payer: 59 | Admitting: Internal Medicine

## 2023-05-12 VITALS — BP 120/70 | HR 94 | Ht 66.5 in | Wt 191.8 lb

## 2023-05-12 DIAGNOSIS — E039 Hypothyroidism, unspecified: Secondary | ICD-10-CM | POA: Diagnosis not present

## 2023-05-12 DIAGNOSIS — E1042 Type 1 diabetes mellitus with diabetic polyneuropathy: Secondary | ICD-10-CM | POA: Diagnosis not present

## 2023-05-12 DIAGNOSIS — E782 Mixed hyperlipidemia: Secondary | ICD-10-CM | POA: Diagnosis not present

## 2023-05-12 LAB — POCT GLYCOSYLATED HEMOGLOBIN (HGB A1C): Hemoglobin A1C: 6.6 % — AB (ref 4.0–5.6)

## 2023-05-12 NOTE — Progress Notes (Signed)
Patient ID: Eric Hayden, male   DOB: March 01, 1976, 47 y.o.   MRN: [id number]  HPI: Eric Hayden is a 47 y.o.-year-old pleasant male, initially referred by his PCP, Dr. Lorin Picket, returning for follow-up for DM1, diagnosed at 42 months old -reportedly undetectable C-peptide, fairly well controlled, with complications (peripheral neuropathy, h/o diabetic retinopathy s/p distant laser therapy). He previously saw endocrinology in [facility] (Dr. Leretha Pol), last office visit in 05/2021.  He had to stop seeing her due to change in insurance.  Last visit with me 6 months ago.  Interim history: No increased urination, blurry vision, chest pain. He is having a hard time with migraines (also vertigo) - started approximately 5 years ago and have been resistant to all treatments.  He started to get Botox injections and he felt an improvement, but in the new year, his insurance did not cover them anymore.  He is seeing neurology and now going to the [facility].  He has allergies >> congestion >> has been on ABx and steroids >> sugars higher.  Reviewed HbA1c levels: Lab Results  Component Value Date   HGBA1C 6.5 (A) 11/08/2022   HGBA1C 6.5 (A) 07/06/2022   HGBA1C 6.8 (A) 03/01/2022    Insulin pump:  - started 1999 - Medtronic 670 - out of warranty 04/2021 - but with [facility] technology Per endo note from 05/2021: Currently using MiniMed pump with CGM, and warranty just ran out.  Has had [employer] for awhile, and they have approved his pump supplies via [employer] UNTIL this summer when [employer] stopped approving reservoirs (but they have continued to send everything EXCEPT the reservoirs). He received a letter saying that they cannot send reservoirs b/c they had not received information that he had failed MDIs (when he has been on pump > 59yrs).  - now T:slim-started Spring 2024.  CGM: - Medtronic   Insulin: - NovoLog >> Fiasp - sugars are better controlled on this/(Humalog  if cannot get FiAsp) - Humalog >> Lispro now  Supplies: - [employer] - having pbs with them...  Pump settings: - basal rates: 12 am: 2.0 units/h 3 am: 2.1 6 am: 1.6 4 pm: 1.4 >> 1.3 6 pm: 1.4 >> 1.3 9 pm: 1.8 - ICR:  12 am: 1:10 6 am: 1:10 >> 1:8 11:30 am: 1:8 >> 1:6 1 pm: 1:10 - Target: 110 - ISF: 40 - Active Insulin Time: 5 h - bolus wizard: on - changes infusion site: q2 days TDD from basal insulin: 35% (41 units) >> 43% (41 units) >> 43% TDD from bolus insulin: 65% (75 units) >> 57% (55 units) >> 57% Total daily dose: 115-140 >> 114-140 units Average amount of carbs entered per day: 737 g >> 512 g >> 602 g  Meter: One Touch Verio >> Contour Link >> AccuChek guide as insurance stopped covering the controlling which connected to his pump  Pt checks his sugars >4 a day with his CGM:     Prev.:  Previously:   Lowest sugar was 40 >> 42 afterworking in the yard >> 30s after working in the yard >> LO >> 39 (active); he has hypoglycemia awareness at 60. Hyperglycemia awareness at 240-250.   He has a glucagon kit at home. No previous hypoglycemia admission.  He has Baqsimi at home. Highest sugar was  300 (pb with the site) >> 200s >> 300s >> 300s. No previous DKA admissions.    Pt's meals are: - Breakfast: Cereal, juice, fruit - Lunch: salad, tea, sandwich -  Dinner: Pasta, casserole, steak or chicken, vegetables, rice/potatoes, milk - Snacks: 3-4 He plays tennis for exercise.  - no CKD, last BUN/creatinine:  Lab Results  Component Value Date   BUN 12 02/10/2023   BUN 16 11/04/2021   CREATININE 1.18 02/10/2023   CREATININE 1.07 11/04/2021   Lab Results  Component Value Date   MICRALBCREAT 0.3 02/10/2023   MICRALBCREAT 0.5 03/11/2021   MICRALBCREAT 0.4 05/17/2017   MICRALBCREAT 0.4 02/10/2016   MICRALBCREAT 0.4 07/25/2015   MICRALBCREAT 0.2 11/22/2012  No ACEI/ ARBs.  - + HL;  last set of lipids: Lab Results  Component Value Date   CHOL 148 02/10/2023    HDL 32.40 (L) 02/10/2023   LDLCALC 83 02/10/2023   TRIG 164.0 (H) 02/10/2023   CHOLHDL 5 02/10/2023  He is on Lipitor 20 mg daily.  - last eye exam was 10/04/2022. + DR.  [facility].  - + pain, numbness, and tingling in his feet.  On amitriptyline 25 mg daily at bedtime and gabapentin 600 mg 2x a day - per neurology.  Last foot exam 02/10/2023. He was on Lyrica before >> not covered anymore.   He has family history of DM2 in mother, MGM, etc. DM1 in maternal uncle.  Hypothyroidism: - dx'ed in ~2009  Pt is on levothyroxine 112 mcg daily (last dose decrease: 07/2022), taken: - in am - fasting - at least 30 min from b'fast - no calcium - no iron - no multivitamins - no PPIs - not on Biotin  Lab Results  Component Value Date   TSH 1.02 02/10/2023   TSH 0.55 11/08/2022   TSH 0.12 (L) 07/06/2022   TSH 0.27 (L) 03/01/2022   TSH 0.14 (L) 11/04/2021   TSH 4.650 (H) 09/04/2021   TSH 15.01 (H) 07/07/2021   TSH 2.01 05/04/2021   TSH 0.05 (L) 03/11/2021   TSH 3.50 09/05/2019   TSH 1.40 05/17/2017   TSH 0.73 02/10/2016   TSH 0.44 07/25/2015   TSH 0.59 12/20/2014   TSH 5.11 04/06/2014   + FH in M and F. No FH of thyroid cancer. No h/o radiation tx to head or neck. No herbal supplements. No Biotin use. No recent steroids use.   Patient also has a history of migraine without aura - on Topamax, GERD.  Also, per review of endocrinology note from 05/2021: 1. T1DM dx at 110 months old, reportedly undetectable cpeptide with prior study:  Hx minor background retinopathy w distant hx of laser tx. No recent DR reported, 11/2016.  No microalbuminuria, 08/2018   No sig peripheral neuropathy, normal monofilament 05/2021  hx of L shoulder superficial chest pain attributed to neuropathy managed on lyrica & elavil (started by [facility] pain clinic, was also evaluated by [facility], Neurology at the time), subsequently changed to neurontin and elavil.   Hx of gastroparesis sx (did not have  gastric emptying study) which previously responded to reglan x 10 days, has not required since. 2. Hypothyroidism 3. OSA w cpap 4. Asthma 5. HTN 6. HLD 7. Hx intermittent L shoulder chest pain s/p several normal stress tests, including nuclear 2014 time frame --> attributed to neuropathy (see above) 8. Vestibular migraines w intermittent vertigo previously tx w trokendi 9. Adhesive capsulitis 05/2019 10. Celiac disease dx 2021  ROS: + see HPI  Past Medical History:  Diagnosis Date   Asthma    Cancer (HCC)    appendiceal   Chicken pox    GERD (gastroesophageal reflux disease)    Heart  murmur    Hx of oral aphthous ulcers    Hypothyroidism    Migraines    Sleep apnea    Type 1 diabetes mellitus (HCC)    Past Surgical History:  Procedure Laterality Date   APPENDECTOMY  2012   COLONOSCOPY WITH PROPOFOL N/A 10/15/2019   Procedure: COLONOSCOPY WITH PROPOFOL;  Surgeon: Wyline Mood, MD;  Location[facility] ENDOSCOPY;  Service: Gastroenterology;  Laterality: N/A;   ESOPHAGOGASTRODUODENOSCOPY (EGD) WITH PROPOFOL N/A 10/15/2019   Procedure: ESOPHAGOGASTRODUODENOSCOPY (EGD) WITH PROPOFOL;  Surgeon: Wyline Mood, MD;  Location[facility] ENDOSCOPY;  Service: Gastroenterology;  Laterality: N/A;   TONSILLECTOMY AND ADENOIDECTOMY  2007   UPPER GI ENDOSCOPY     Social History   Socioeconomic History   Marital status: Married    Spouse name: Not on file   Number of children: 2   Years of education: Not on file   Highest education level: Not on file  Occupational History   Occupation: Education officer, environmental, [employer]  Tobacco Use   Smoking status: Never   Smokeless tobacco: Never  Vaping Use   Vaping status: Never Used  Substance and Sexual Activity   Alcohol use: No   Drug use: No   Sexual activity: Not on file  Other Topics Concern   Not on file  Social History Narrative   Not on file   Social Determinants of Health   Financial Resource Strain: Not on file  Food Insecurity:  Not on file  Transportation Needs: Not on file  Physical Activity: Not on file  Stress: Not on file  Social Connections: Not on file  Intimate Partner Violence: Not on file   Current Outpatient Medications on File Prior to Visit  Medication Sig Dispense Refill   albuterol (VENTOLIN HFA) 108 (90 Base) MCG/ACT inhaler Inhale 2 puffs into the lungs every 6 (six) hours as needed for wheezing or shortness of breath. 18 g 0   amitriptyline (ELAVIL) 50 MG tablet Take 50 mg by mouth daily.     atorvastatin (LIPITOR) 20 MG tablet TAKE 1 TABLET BY MOUTH DAILY 90 tablet 3   Blood Glucose Monitoring Suppl (ONETOUCH VERIO FLEX SYSTEM) w/Device KIT See admin instructions.     Cholecalciferol (VITAMIN D3 PO) Take by mouth.     clotrimazole-betamethasone (LOTRISONE) cream Apply 1 Application topically 2 (two) times daily. 30 g 0   Continuous Glucose Sensor (DEXCOM G6 SENSOR) MISC APPLY 1 SENSOR EVERY 10 DAYS 9 each 3   Continuous Glucose Transmitter (DEXCOM G6 TRANSMITTER) MISC USE FOR 3 MONTHS 1 each 3   CONTOUR NEXT TEST test strip   1   cyproheptadine (PERIACTIN) 4 MG tablet Take 4 mg by mouth 2 (two) times daily.     folic acid (FOLVITE) 800 MCG tablet Take by mouth.     gabapentin (NEURONTIN) 300 MG capsule TAKE 1 CAPSULE TWICE DAILY 180 capsule 1   insulin lispro (HUMALOG) 100 UNIT/ML injection Inject into the skin daily up to 160 units a day as directed. 40 mL 11   levothyroxine (SYNTHROID) 112 MCG tablet TAKE ONE TABLET BY MOUTH EVERY DAY 45 tablet 1   omeprazole (PRILOSEC) 20 MG capsule Take 1 capsule (20 mg total) by mouth daily. 90 capsule 1   Probiotic Product (PROBIOTIC PO) Take by mouth.     topiramate (TOPAMAX) 25 MG tablet TAKE 1 TO 2 TABLETS BY MOUTH EVERY DAY 60 tablet 1   triamcinolone cream (KENALOG) 0.1 % Apply 1 Application topically 2 (two) times  daily as needed (Apply to affected area). 454 g 0   No current facility-administered medications on file prior to visit.   Allergies   Allergen Reactions   Augmentin [Amoxicillin-Pot Clavulanate] Nausea And Vomiting   Levaquin [Levofloxacin In D5w] Swelling    Blurred vision   Family History  Problem Relation Age of Onset   Arthritis Maternal Grandmother    Hyperlipidemia Maternal Grandmother    Heart disease Maternal Grandmother    Hypertension Maternal Grandmother    Diabetes Maternal Grandmother        type 2   Heart attack Maternal Grandmother    Arthritis Maternal Grandfather    Heart disease Maternal Grandfather    Hypertension Maternal Grandfather    Heart attack Maternal Grandfather    Arthritis Mother    Hyperlipidemia Mother    Hypertension Mother    Diabetes Mother        type 2   Hyperlipidemia Father    Arthritis Paternal Grandmother    Arthritis Paternal Grandfather    Heart attack Paternal Aunt    PE: BP 120/70   Pulse 94   Ht 5' 6.5" (1.689 m)   Wt 191 lb 12.8 oz (87 kg)   SpO2 97%   BMI 30.49 kg/m  Wt Readings from Last 3 Encounters:  05/12/23 191 lb 12.8 oz (87 kg)  02/10/23 194 lb 8 oz (88.2 kg)  11/08/22 194 lb 9.6 oz (88.3 kg)   Constitutional: overweight, in NAD Eyes: no exophthalmos ENT: no masses palpated in neck, no cervical lymphadenopathy Cardiovascular: Tachycardia, RR, No MRG Respiratory: CTA B Musculoskeletal: no deformities Skin: no rashes Neurological: no tremor with outstretched hands  ASSESSMENT: 1. DM1, fairly well controlled, with complications - Peripheral neuropathy - h/o DR -resolved after laser surgery  2. HL  3. Hypothyroidism  PLAN:  1. Patient with longstanding, fairly well-controlled type 1 diabetes, on the t:slim insulin pump integrated with Dexcom CGM.  At last visit, HbA1c was stable, at 6.5%.  Sugars appears to be gradually increasing in the morning with a peak after his brunch and then a decrease in blood sugars before dinner, and a nadir between 7-10 PM.  Sugars were higher afterwards and they were again decreasing between 3 AM and 6 AM.   Upon reviewing his pump settings, he did increase his basal rates in the late afternoon since the previous visit so we went ahead and decreased them slightly.  I also advised him to strengthen his insulin to carb ratios for lunch or brunch.  We discussed that if he did not enter all of his carbs into the pump at the beginning of the meal, to do a correction later but only for blood sugars, not to enter any more carbs. CGM interpretation: -At today's visit, we reviewed his CGM downloads: It appears that 68% of values are in target range (goal >70%), while 29% are higher than 180 (goal <25%), and 3% are lower than 70 (goal <4%).  The calculated average blood sugar is 153.  The projected HbA1c for the next 3 months (GMI) is 7.0%. -Reviewing the CGM trends, sugars are mostly fluctuating within the target range, with more variability than before, reportedly due to upper respiratory congestion that he has been having for 3 months.  Because of this, he goes to bed late and has another meal around 12 AM.  He is not bolusing before the meal, but only after the sugars already high postprandially and as a consequence, his sugars are dropping too  low between 3 AM and 7 AM.  We discussed that he absolutely needs to start the bolus 15 minutes before the meal.  I advised him that if he needs to bolus after the sugars already high after meal, he may just need to do a correction.  Reviewing the pump downloads he is also not bolusing consistently 15 minutes before the rest of the meals and I strongly advised him to do so since sugars are higher later in the day, also.  Otherwise, I did not feel we need to change his regimen. - I suggested to: Patient Instructions  Please use the following pump settings: - basal rates: 12 am: 2.0 units/h 3 am: 2.1 6 am: 1.6 4 pm: 1.3 6 pm: 1.3 9 pm: 1.8 - ICR:  12 am: 1:10 6 am: 1:8 11:30 am: 1:6 1 pm: 1:10 - Target: 110 - ISF: 40 - Active Insulin Time: 5 h - bolus wizard:  on  START boluses 15 min before each meal!  If you do not enter all the carbs at the beginning of the meal, just do a correction when the postprandial sugars are high.  Please continue Levothyroxine 112 mcg daily.  Take the thyroid hormone every day, with water, at least 30 minutes before breakfast, separated by at least 4 hours from: - acid reflux medications - calcium - iron - multivitamins  Please return in 4 months.  - we checked his HbA1c: 6.6% (slightly higher) - advised to check sugars at different times of the day - 4x a day, rotating check times - he has peripheral neuropathy and his previous endocrinologist was refilling his amitriptyline and Neurontin.  However, he is seeing neurology and I suggested to have them refill these, if they agree. - advised for yearly eye exams >> he is UTD - return to clinic in 3-4 months  2.  Hyperlipidemia -Latest lipid panel reviewed: LDL higher than our goal of less than 70, HDL slightly low: Lab Results  Component Value Date   CHOL 148 02/10/2023   HDL 32.40 (L) 02/10/2023   LDLCALC 83 02/10/2023   TRIG 164.0 (H) 02/10/2023   CHOLHDL 5 02/10/2023  -He continues on Lipitor 40 mg daily without side effects  3. Hypothyroidism - latest thyroid labs reviewed with pt. >> normal: Lab Results  Component Value Date   TSH 1.02 02/10/2023  - he continues on LT4 112 mcg daily - pt feels good on this dose. - we discussed about taking the thyroid hormone every day, with water, >30 minutes before breakfast, separated by >4 hours from acid reflux medications, calcium, iron, multivitamins. Pt. is taking it correctly.  Carlus Pavlov, MD PhD [facility] Endocrinology

## 2023-05-12 NOTE — Patient Instructions (Addendum)
Please use the following pump settings: - basal rates: 12 am: 2.0 units/h 3 am: 2.1 6 am: 1.6 4 pm: 1.3 6 pm: 1.3 9 pm: 1.8 - ICR:  12 am: 1:10 6 am: 1:8 11:30 am: 1:6 1 pm: 1:10 - Target: 110 - ISF: 40 - Active Insulin Time: 5 h - bolus wizard: on  START boluses 15 min before each meal!  If you do not enter all the carbs at the beginning of the meal, just do a correction when the postprandial sugars are high.  Please continue Levothyroxine 112 mcg daily.  Take the thyroid hormone every day, with water, at least 30 minutes before breakfast, separated by at least 4 hours from: - acid reflux medications - calcium - iron - multivitamins  Please return in 4 months.

## 2023-05-13 NOTE — Addendum Note (Signed)
Addended by: Pollie Meyer on: 05/13/2023 08:35 AM   Modules accepted: Orders

## 2023-05-18 ENCOUNTER — Telehealth: Payer: Self-pay | Admitting: Pharmacy Technician

## 2023-05-18 ENCOUNTER — Other Ambulatory Visit (HOSPITAL_COMMUNITY): Payer: Self-pay

## 2023-05-18 NOTE — Telephone Encounter (Signed)
Pharmacy Patient Advocate Encounter   Received notification from CoverMyMeds that prior authorization for Omeprazole 20MG  dr capsules is required/requested.   Insurance verification completed.   The patient is insured through [facility] .   Per test claim: PA required; PA submitted to [facility] via CoverMyMeds Key/confirmation #/EOC [id number] Status is pending

## 2023-05-19 ENCOUNTER — Other Ambulatory Visit (HOSPITAL_COMMUNITY): Payer: Self-pay

## 2023-05-20 NOTE — Telephone Encounter (Signed)
Pharmacy Patient Advocate Encounter  Received notification from [facility] that Prior Authorization for Omeprazole has been APPROVED from 05/17/2023 to 05/16/2024   PA #/Case ID/Reference #: [id number]

## 2023-05-28 ENCOUNTER — Other Ambulatory Visit: Payer: Self-pay | Admitting: Internal Medicine

## 2023-05-30 ENCOUNTER — Encounter: Payer: Self-pay | Admitting: Internal Medicine

## 2023-06-15 ENCOUNTER — Encounter: Payer: Self-pay | Admitting: Internal Medicine

## 2023-06-17 MED ORDER — INSULIN ASPART 100 UNIT/ML IJ SOLN: 160.0000 [IU] | Freq: Every day | INTRAMUSCULAR | 3 refills | Status: DC

## 2023-06-28 DIAGNOSIS — E1042 Type 1 diabetes mellitus with diabetic polyneuropathy: Secondary | ICD-10-CM | POA: Diagnosis not present

## 2023-06-28 DIAGNOSIS — Z9641 Presence of insulin pump (external) (internal): Secondary | ICD-10-CM | POA: Diagnosis not present

## 2023-06-28 DIAGNOSIS — E1010 Type 1 diabetes mellitus with ketoacidosis without coma: Secondary | ICD-10-CM | POA: Diagnosis not present

## 2023-06-28 DIAGNOSIS — Z794 Long term (current) use of insulin: Secondary | ICD-10-CM | POA: Diagnosis not present

## 2023-07-06 ENCOUNTER — Ambulatory Visit: Payer: 59

## 2023-07-06 DIAGNOSIS — J0100 Acute maxillary sinusitis, unspecified: Secondary | ICD-10-CM | POA: Diagnosis not present

## 2023-07-06 DIAGNOSIS — Z6830 Body mass index (BMI) 30.0-30.9, adult: Secondary | ICD-10-CM | POA: Diagnosis not present

## 2023-07-13 ENCOUNTER — Ambulatory Visit: Payer: 59 | Admitting: Internal Medicine

## 2023-08-04 ENCOUNTER — Other Ambulatory Visit: Payer: Self-pay | Admitting: Internal Medicine

## 2023-08-11 DIAGNOSIS — G43109 Migraine with aura, not intractable, without status migrainosus: Secondary | ICD-10-CM | POA: Diagnosis not present

## 2023-08-11 DIAGNOSIS — G43E11 Chronic migraine with aura, intractable, with status migrainosus: Secondary | ICD-10-CM | POA: Diagnosis not present

## 2023-08-11 DIAGNOSIS — E1042 Type 1 diabetes mellitus with diabetic polyneuropathy: Secondary | ICD-10-CM | POA: Diagnosis not present

## 2023-08-15 ENCOUNTER — Encounter: Payer: Self-pay | Admitting: *Deleted

## 2023-08-15 ENCOUNTER — Encounter: Payer: Self-pay | Admitting: Internal Medicine

## 2023-08-15 ENCOUNTER — Ambulatory Visit (INDEPENDENT_AMBULATORY_CARE_PROVIDER_SITE_OTHER): Payer: 59 | Admitting: Internal Medicine

## 2023-08-15 DIAGNOSIS — E1040 Type 1 diabetes mellitus with diabetic neuropathy, unspecified: Secondary | ICD-10-CM

## 2023-08-15 DIAGNOSIS — E039 Hypothyroidism, unspecified: Secondary | ICD-10-CM

## 2023-08-15 DIAGNOSIS — E7800 Pure hypercholesterolemia, unspecified: Secondary | ICD-10-CM

## 2023-08-15 NOTE — Progress Notes (Addendum)
 Patient ID: Eric Hayden, male   DOB: 1976/05/14, 48 y.o.   MRN: [id number] Did not show for appt.

## 2023-08-15 NOTE — Progress Notes (Deleted)
 Subjective:    Patient ID: Eric Hayden, male    DOB: 29-May-1976, 48 y.o.   MRN: [id number]  Patient here for No chief complaint on file.   HPI Here for a scheduled follow up - follow up regarding diabetes, chronic headaches and hypothyroidism. Evaluated by Dr Germaine [facility]) - 08/11/23. Vyepti infusions and Cefaly daily. Followed by Dr Trixie - for his diabetes - insulin  pump.    Past Medical History:  Diagnosis Date   Asthma    Cancer (HCC)    appendiceal   Chicken pox    GERD (gastroesophageal reflux disease)    Heart murmur    Hx of oral aphthous ulcers    Hypothyroidism    Migraines    Sleep apnea    Type 1 diabetes mellitus (HCC)    Past Surgical History:  Procedure Laterality Date   APPENDECTOMY  2012   COLONOSCOPY WITH PROPOFOL  N/A 10/15/2019   Procedure: COLONOSCOPY WITH PROPOFOL ;  Surgeon: Therisa Bi, MD;  Location: [facility] ENDOSCOPY;  Service: Gastroenterology;  Laterality: N/A;   ESOPHAGOGASTRODUODENOSCOPY (EGD) WITH PROPOFOL  N/A 10/15/2019   Procedure: ESOPHAGOGASTRODUODENOSCOPY (EGD) WITH PROPOFOL ;  Surgeon: Therisa Bi, MD;  Location: [facility] ENDOSCOPY;  Service: Gastroenterology;  Laterality: N/A;   TONSILLECTOMY AND ADENOIDECTOMY  2007   UPPER GI ENDOSCOPY     Family History  Problem Relation Age of Onset   Arthritis Maternal Grandmother    Hyperlipidemia Maternal Grandmother    Heart disease Maternal Grandmother    Hypertension Maternal Grandmother    Diabetes Maternal Grandmother        type 2   Heart attack Maternal Grandmother    Arthritis Maternal Grandfather    Heart disease Maternal Grandfather    Hypertension Maternal Grandfather    Heart attack Maternal Grandfather    Arthritis Mother    Hyperlipidemia Mother    Hypertension Mother    Diabetes Mother        type 2   Hyperlipidemia Father    Arthritis Paternal Grandmother    Arthritis Paternal Grandfather    Heart attack Paternal Aunt    Social History   Socioeconomic History    Marital status: Married    Spouse name: Not on file   Number of children: 2   Years of education: Not on file   Highest education level: Not on file  Occupational History   Occupation: Education Officer, Environmental, [employer]  Tobacco Use   Smoking status: Never   Smokeless tobacco: Never  Vaping Use   Vaping status: Never Used  Substance and Sexual Activity   Alcohol use: No   Drug use: No   Sexual activity: Not on file  Other Topics Concern   Not on file  Social History Narrative   Not on file   Social Drivers of Health   Financial Resource Strain: Not on file  Food Insecurity: Not on file  Transportation Needs: Not on file  Physical Activity: Not on file  Stress: Not on file  Social Connections: Not on file     Review of Systems     Objective:     There were no vitals taken for this visit. Wt Readings from Last 3 Encounters:  05/12/23 191 lb 12.8 oz (87 kg)  02/10/23 194 lb 8 oz (88.2 kg)  11/08/22 194 lb 9.6 oz (88.3 kg)    Physical Exam   Outpatient Encounter Medications as of 08/15/2023  Medication Sig   albuterol  (VENTOLIN  HFA) 108 (90 Base) MCG/ACT inhaler  Inhale 2 puffs into the lungs every 6 (six) hours as needed for wheezing or shortness of breath.   amitriptyline  (ELAVIL ) 50 MG tablet Take 50 mg by mouth daily.   atorvastatin  (LIPITOR) 20 MG tablet TAKE 1 TABLET BY MOUTH DAILY   Blood Glucose Monitoring Suppl (ONETOUCH VERIO FLEX SYSTEM) w/Device KIT See admin instructions.   Cholecalciferol (VITAMIN D3 PO) Take by mouth.   clotrimazole -betamethasone  (LOTRISONE ) cream Apply 1 Application topically 2 (two) times daily.   Continuous Glucose Sensor (DEXCOM G6 SENSOR) MISC APPLY 1 SENSOR EVERY 10 DAYS   Continuous Glucose Transmitter (DEXCOM G6 TRANSMITTER) MISC USE FOR 3 MONTHS   CONTOUR NEXT TEST test strip    cyproheptadine (PERIACTIN) 4 MG tablet Take 4 mg by mouth 2 (two) times daily.   folic acid (FOLVITE) 800 MCG tablet Take by mouth.    gabapentin  (NEURONTIN ) 300 MG capsule TAKE 1 CAPSULE TWICE DAILY   insulin  aspart (NOVOLOG ) 100 UNIT/ML injection Inject 160 Units into the skin daily. Inject up to 1.6 mLs (160 Units total) into the skin as directed.   levothyroxine  (SYNTHROID ) 112 MCG tablet TAKE ONE TABLET BY MOUTH EVERY DAY   omeprazole  (PRILOSEC) 20 MG capsule Take 1 capsule (20 mg total) by mouth daily.   Probiotic Product (PROBIOTIC PO) Take by mouth.   topiramate  (TOPAMAX ) 25 MG tablet TAKE 1 TO 2 TABLETS BY MOUTH EVERY DAY   triamcinolone  cream (KENALOG ) 0.1 % Apply 1 Application topically 2 (two) times daily as needed (Apply to affected area).   No facility-administered encounter medications on file as of 08/15/2023.     Lab Results  Component Value Date   WBC 10.5 02/10/2023   HGB 14.4 02/10/2023   HCT 42.8 02/10/2023   PLT 307.0 02/10/2023   GLUCOSE 202 (H) 02/10/2023   CHOL 148 02/10/2023   TRIG 164.0 (H) 02/10/2023   HDL 32.40 (L) 02/10/2023   LDLCALC 83 02/10/2023   ALT 24 03/11/2023   AST 17 03/11/2023   NA 138 02/10/2023   K 4.4 02/10/2023   CL 106 02/10/2023   CREATININE 1.18 02/10/2023   BUN 12 02/10/2023   CO2 25 02/10/2023   TSH 1.02 02/10/2023   PSA 0.91 02/10/2023   HGBA1C 6.6 (A) 05/12/2023   MICROALBUR <0.7 02/10/2023    No results found.     Assessment & Plan:  There are no diagnoses linked to this encounter.   Allena Hamilton, MD

## 2023-09-09 ENCOUNTER — Encounter: Payer: Self-pay | Admitting: Internal Medicine

## 2023-09-12 ENCOUNTER — Other Ambulatory Visit: Payer: Self-pay | Admitting: Internal Medicine

## 2023-09-12 DIAGNOSIS — L03818 Cellulitis of other sites: Secondary | ICD-10-CM

## 2023-09-12 MED ORDER — DOXYCYCLINE HYCLATE 100 MG PO TABS: 100.0000 mg | ORAL_TABLET | Freq: Two times a day (BID) | ORAL | 0 refills | Status: DC

## 2023-09-13 ENCOUNTER — Ambulatory Visit: Payer: 59 | Admitting: Internal Medicine

## 2023-09-15 DIAGNOSIS — H60333 Swimmer's ear, bilateral: Secondary | ICD-10-CM | POA: Diagnosis not present

## 2023-09-15 DIAGNOSIS — H6123 Impacted cerumen, bilateral: Secondary | ICD-10-CM | POA: Diagnosis not present

## 2023-09-16 DIAGNOSIS — Z9641 Presence of insulin pump (external) (internal): Secondary | ICD-10-CM | POA: Diagnosis not present

## 2023-09-16 DIAGNOSIS — E1010 Type 1 diabetes mellitus with ketoacidosis without coma: Secondary | ICD-10-CM | POA: Diagnosis not present

## 2023-09-16 DIAGNOSIS — Z794 Long term (current) use of insulin: Secondary | ICD-10-CM | POA: Diagnosis not present

## 2023-09-16 DIAGNOSIS — E1042 Type 1 diabetes mellitus with diabetic polyneuropathy: Secondary | ICD-10-CM | POA: Diagnosis not present

## 2023-09-17 DIAGNOSIS — G4733 Obstructive sleep apnea (adult) (pediatric): Secondary | ICD-10-CM | POA: Diagnosis not present

## 2023-09-18 ENCOUNTER — Encounter: Payer: Self-pay | Admitting: Internal Medicine

## 2023-09-19 ENCOUNTER — Telehealth: Payer: 59 | Admitting: Physician Assistant

## 2023-09-19 DIAGNOSIS — J101 Influenza due to other identified influenza virus with other respiratory manifestations: Secondary | ICD-10-CM | POA: Diagnosis not present

## 2023-09-19 MED ORDER — BENZONATATE 100 MG PO CAPS: 100.0000 mg | ORAL_CAPSULE | Freq: Three times a day (TID) | ORAL | 0 refills | Status: DC | PRN

## 2023-09-19 MED ORDER — PROMETHAZINE-DM 6.25-15 MG/5ML PO SYRP: 5.0000 mL | ORAL_SOLUTION | Freq: Four times a day (QID) | ORAL | 0 refills | Status: DC | PRN

## 2023-09-19 MED ORDER — OSELTAMIVIR PHOSPHATE 75 MG PO CAPS: 75.0000 mg | ORAL_CAPSULE | Freq: Two times a day (BID) | ORAL | 0 refills | Status: DC

## 2023-09-19 NOTE — Progress Notes (Signed)
 Virtual Visit Consent   Eric Hayden [facility], you are scheduled for a virtual visit with a [facility] provider today. Just as with appointments in the office, your consent must be obtained to participate. Your consent will be active for this visit and any virtual visit you may have with one of our providers in the next 365 days. If you have a MyChart account, a copy of this consent can be sent to you electronically.  As this is a virtual visit, video technology does not allow for your provider to perform a traditional examination. This may limit your provider's ability to fully assess your condition. If your provider identifies any concerns that need to be evaluated in person or the need to arrange testing (such as labs, EKG, etc.), we will make arrangements to do so. Although advances in technology are sophisticated, we cannot ensure that it will always work on either your end or our end. If the connection with a video visit is poor, the visit may have to be switched to a telephone visit. With either a video or telephone visit, we are not always able to ensure that we have a secure connection.  By engaging in this virtual visit, you consent to the provision of healthcare and authorize for your insurance to be billed (if applicable) for the services provided during this visit. Depending on your insurance coverage, you may receive a charge related to this service.  I need to obtain your verbal consent now. Are you willing to proceed with your visit today? Eric Hayden [facility] has provided verbal consent on 09/19/2023 for a virtual visit (video or telephone). [name], PA-C  Date: 09/19/2023 2:31 PM   Virtual Visit via Video Note   I, [name], connected with  Eric Hayden [facility]  ([id number], February 13, 1976) on 09/19/23 at  2:15 PM EST by a video-enabled telemedicine application and verified that I am speaking with the correct person using two identifiers.  Location: Patient: Virtual Visit Location  Patient: Home Provider: Virtual Visit Location Provider: Home Office   I discussed the limitations of evaluation and management by telemedicine and the availability of in person appointments. The patient expressed understanding and agreed to proceed.    History of Present Illness: Eric Hayden is a 48 y.o. who identifies as a male who was assigned male at birth, and is being seen today for flu-like symptoms.  HPI: URI  This is a new problem. The current episode started yesterday. The problem has been gradually worsening. Maximum temperature: reports low grade (99.5-100) The fever has been present for 1 to 2 days. Associated symptoms include abdominal pain (from coughing), congestion, coughing, headaches and rhinorrhea (and post nasal drainage). Pertinent negatives include no diarrhea, ear pain, nausea, plugged ear sensation, sinus pain, sore throat or wheezing. Associated symptoms comments: Chills, body aches. Treatments tried: Mucinex DM, Albuterol, advil. The treatment provided no relief.  Flu exposure from household members  At home Covid + Flu positive for Influenza A PMH: T1DM On Cipro oral and Ciprodex ear drops from ENT  Problems:  Patient Active Problem List   Diagnosis Date Noted   Mixed hyperlipidemia 03/01/2022   DM type 1 with diabetic peripheral neuropathy (HCC) 11/03/2021   Earache 07/08/2021   Celiac disease 07/07/2021   History of COVID-19 03/02/2021   Altered level of consciousness 05/22/2017   Syncope 05/17/2017   Cough 05/07/2016   Dizziness 04/12/2016   Elevated alkaline phosphatase level 08/17/2015   Health care maintenance 12/09/2014  Chest pain 12/09/2014   Headache 05/20/2014   Abdominal pain 05/20/2014   Back pain 08/19/2013   Type 1 diabetes mellitus (HCC) 11/18/2012   Hypercholesterolemia 11/18/2012   Neuropathy 11/18/2012   Obstructive sleep apnea 11/18/2012   GERD (gastroesophageal reflux disease) 11/18/2012   Hypothyroidism 11/18/2012     Allergies:  Allergies  Allergen Reactions   Augmentin [Amoxicillin-Pot Clavulanate] Nausea And Vomiting   Levaquin [Levofloxacin In D5w] Swelling    Blurred vision   Medications:  Current Outpatient Medications:    albuterol (VENTOLIN HFA) 108 (90 Base) MCG/ACT inhaler, Inhale 2 puffs into the lungs every 6 (six) hours as needed for wheezing or shortness of breath., Disp: 18 g, Rfl: 0   amitriptyline (ELAVIL) 50 MG tablet, Take 50 mg by mouth daily., Disp: , Rfl:    atorvastatin (LIPITOR) 20 MG tablet, TAKE 1 TABLET BY MOUTH DAILY, Disp: 90 tablet, Rfl: 3   benzonatate (TESSALON) 100 MG capsule, Take 1-2 capsules (100-200 mg total) by mouth 3 (three) times daily as needed., Disp: 30 capsule, Rfl: 0   Blood Glucose Monitoring Suppl (ONETOUCH VERIO FLEX SYSTEM) w/Device KIT, See admin instructions., Disp: , Rfl:    Cholecalciferol (VITAMIN D3 PO), Take by mouth., Disp: , Rfl:    clotrimazole-betamethasone (LOTRISONE) cream, Apply 1 Application topically 2 (two) times daily., Disp: 30 g, Rfl: 0   Continuous Glucose Sensor (DEXCOM G6 SENSOR) MISC, APPLY 1 SENSOR EVERY 10 DAYS, Disp: 9 each, Rfl: 3   Continuous Glucose Transmitter (DEXCOM G6 TRANSMITTER) MISC, USE FOR 3 MONTHS, Disp: 1 each, Rfl: 3   CONTOUR NEXT TEST test strip, , Disp: , Rfl: 1   cyproheptadine (PERIACTIN) 4 MG tablet, Take 4 mg by mouth 2 (two) times daily., Disp: , Rfl:    doxycycline (VIBRA-TABS) 100 MG tablet, Take 1 tablet (100 mg total) by mouth 2 (two) times daily., Disp: 10 tablet, Rfl: 0   folic acid (FOLVITE) 800 MCG tablet, Take by mouth., Disp: , Rfl:    gabapentin (NEURONTIN) 300 MG capsule, TAKE 1 CAPSULE TWICE DAILY, Disp: 180 capsule, Rfl: 1   insulin aspart (NOVOLOG) 100 UNIT/ML injection, Inject 160 Units into the skin daily. Inject up to 1.6 mLs (160 Units total) into the skin as directed., Disp: 150 mL, Rfl: 3   levothyroxine (SYNTHROID) 112 MCG tablet, TAKE ONE TABLET BY MOUTH EVERY DAY, Disp: 45 tablet,  Rfl: 1   omeprazole (PRILOSEC) 20 MG capsule, Take 1 capsule (20 mg total) by mouth daily., Disp: 90 capsule, Rfl: 1   oseltamivir (TAMIFLU) 75 MG capsule, Take 1 capsule (75 mg total) by mouth 2 (two) times daily., Disp: 10 capsule, Rfl: 0   Probiotic Product (PROBIOTIC PO), Take by mouth., Disp: , Rfl:    promethazine-dextromethorphan (PROMETHAZINE-DM) 6.25-15 MG/5ML syrup, Take 5 mLs by mouth 4 (four) times daily as needed., Disp: 118 mL, Rfl: 0   topiramate (TOPAMAX) 25 MG tablet, TAKE 1 TO 2 TABLETS BY MOUTH EVERY DAY, Disp: 60 tablet, Rfl: 1   triamcinolone cream (KENALOG) 0.1 %, Apply 1 Application topically 2 (two) times daily as needed (Apply to affected area)., Disp: 454 g, Rfl: 0  Observations/Objective: Patient is well-developed, well-nourished in no acute distress.  Resting comfortably at home.  Head is normocephalic, atraumatic.  No labored breathing.  Speech is clear and coherent with logical content.  Patient is alert and oriented at baseline.    Assessment and Plan: 1. Influenza A (Primary) - oseltamivir (TAMIFLU) 75 MG capsule; Take 1 capsule (75  mg total) by mouth 2 (two) times daily.  Dispense: 10 capsule; Refill: 0 - promethazine-dextromethorphan (PROMETHAZINE-DM) 6.25-15 MG/5ML syrup; Take 5 mLs by mouth 4 (four) times daily as needed.  Dispense: 118 mL; Refill: 0 - benzonatate (TESSALON) 100 MG capsule; Take 1-2 capsules (100-200 mg total) by mouth 3 (three) times daily as needed.  Dispense: 30 capsule; Refill: 0  - Suspect influenza due to symptoms and positive exposure - Tamiflu prescribed - Promethazine DM and Tessalon perles for cough - Continue OTC medication of choice for symptomatic management - Push fluids - Rest - Seek in person evaluation if symptoms worsen or fail to improve    Follow Up Instructions: I discussed the assessment and treatment plan with the patient. The patient was provided an opportunity to ask questions and all were answered. The  patient agreed with the plan and demonstrated an understanding of the instructions.  A copy of instructions were sent to the patient via MyChart unless otherwise noted below.    The patient was advised to call back or seek an in-person evaluation if the symptoms worsen or if the condition fails to improve as anticipated.    [name], PA-C

## 2023-09-19 NOTE — Patient Instructions (Signed)
 Eric Hayden, thank you for joining Eric Loveless, PA-C for today's virtual visit.  While this provider is not your primary care provider (PCP), if your PCP is located in our provider database this encounter information will be shared with them immediately following your visit.   A [facility] MyChart account gives you access to today's visit and all your visits, tests, and labs performed at [facility] " click here if you don't have a [facility] MyChart account or go to [email or web address]  Consent: (Patient) Eric Hayden [facility] provided verbal consent for this virtual visit at the beginning of the encounter.  Current Medications:  Current Outpatient Medications:    albuterol (VENTOLIN HFA) 108 (90 Base) MCG/ACT inhaler, Inhale 2 puffs into the lungs every 6 (six) hours as needed for wheezing or shortness of breath., Disp: 18 g, Rfl: 0   amitriptyline (ELAVIL) 50 MG tablet, Take 50 mg by mouth daily., Disp: , Rfl:    atorvastatin (LIPITOR) 20 MG tablet, TAKE 1 TABLET BY MOUTH DAILY, Disp: 90 tablet, Rfl: 3   benzonatate (TESSALON) 100 MG capsule, Take 1-2 capsules (100-200 mg total) by mouth 3 (three) times daily as needed., Disp: 30 capsule, Rfl: 0   Blood Glucose Monitoring Suppl (ONETOUCH VERIO FLEX SYSTEM) w/Device KIT, See admin instructions., Disp: , Rfl:    Cholecalciferol (VITAMIN D3 PO), Take by mouth., Disp: , Rfl:    clotrimazole-betamethasone (LOTRISONE) cream, Apply 1 Application topically 2 (two) times daily., Disp: 30 g, Rfl: 0   Continuous Glucose Sensor (DEXCOM G6 SENSOR) MISC, APPLY 1 SENSOR EVERY 10 DAYS, Disp: 9 each, Rfl: 3   Continuous Glucose Transmitter (DEXCOM G6 TRANSMITTER) MISC, USE FOR 3 MONTHS, Disp: 1 each, Rfl: 3   CONTOUR NEXT TEST test strip, , Disp: , Rfl: 1   cyproheptadine (PERIACTIN) 4 MG tablet, Take 4 mg by mouth 2 (two) times daily., Disp: , Rfl:    doxycycline (VIBRA-TABS) 100 MG tablet, Take 1 tablet (100 mg total) by mouth 2  (two) times daily., Disp: 10 tablet, Rfl: 0   folic acid (FOLVITE) 800 MCG tablet, Take by mouth., Disp: , Rfl:    gabapentin (NEURONTIN) 300 MG capsule, TAKE 1 CAPSULE TWICE DAILY, Disp: 180 capsule, Rfl: 1   insulin aspart (NOVOLOG) 100 UNIT/ML injection, Inject 160 Units into the skin daily. Inject up to 1.6 mLs (160 Units total) into the skin as directed., Disp: 150 mL, Rfl: 3   levothyroxine (SYNTHROID) 112 MCG tablet, TAKE ONE TABLET BY MOUTH EVERY DAY, Disp: 45 tablet, Rfl: 1   omeprazole (PRILOSEC) 20 MG capsule, Take 1 capsule (20 mg total) by mouth daily., Disp: 90 capsule, Rfl: 1   oseltamivir (TAMIFLU) 75 MG capsule, Take 1 capsule (75 mg total) by mouth 2 (two) times daily., Disp: 10 capsule, Rfl: 0   Probiotic Product (PROBIOTIC PO), Take by mouth., Disp: , Rfl:    promethazine-dextromethorphan (PROMETHAZINE-DM) 6.25-15 MG/5ML syrup, Take 5 mLs by mouth 4 (four) times daily as needed., Disp: 118 mL, Rfl: 0   topiramate (TOPAMAX) 25 MG tablet, TAKE 1 TO 2 TABLETS BY MOUTH EVERY DAY, Disp: 60 tablet, Rfl: 1   triamcinolone cream (KENALOG) 0.1 %, Apply 1 Application topically 2 (two) times daily as needed (Apply to affected area)., Disp: 454 g, Rfl: 0   Medications ordered in this encounter:  Meds ordered this encounter  Medications   oseltamivir (TAMIFLU) 75 MG capsule    Sig: Take 1 capsule (75 mg total) by mouth 2 (two)  times daily.    Dispense:  10 capsule    Refill:  0    Supervising Provider:   Merrilee Hayden [[id number]]   promethazine-dextromethorphan (PROMETHAZINE-DM) 6.25-15 MG/5ML syrup    Sig: Take 5 mLs by mouth 4 (four) times daily as needed.    Dispense:  118 mL    Refill:  0    Supervising Provider:   Merrilee Hayden [[id number]]   benzonatate (TESSALON) 100 MG capsule    Sig: Take 1-2 capsules (100-200 mg total) by mouth 3 (three) times daily as needed.    Dispense:  30 capsule    Refill:  0    Supervising Provider:   Merrilee Hayden [[id number]]     *If you  need refills on other medications prior to your next appointment, please contact your pharmacy*  Follow-Up: Call back or seek an in-person evaluation if the symptoms worsen or if the condition fails to improve as anticipated.  [facility] [phone]  Other Instructions Influenza, Adult Influenza is also called the flu. It's an infection that affects your respiratory tract. This includes your nose, throat, windpipe, and lungs. The flu is contagious. This means it spreads easily from person to person. It causes symptoms that are like a cold. It can also cause a high fever and body aches. What are the causes? The flu is caused by the influenza virus. You can get it by: Breathing in droplets that are in the air after an infected person coughs or sneezes. Touching something that has the virus on it and then touching your mouth, nose, or eyes. What increases the risk? You may be more likely to get the flu if: You don't wash your hands often. You're near a lot of people during cold and flu season. You touch your mouth, eyes, or nose without washing your hands first. You don't get a flu shot each year. You may also be more at risk for the flu and serious problems, such as a lung infection called pneumonia, if: You're older than 65. You're pregnant. Your immune system is weak. Your immune system is your body's defense system. You have a long-term, or chronic, condition, such as: Heart, kidney, or lung disease. Diabetes. A liver disorder. Asthma. You're very overweight. You have anemia. This is when you don't have enough red blood cells in your body. What are the signs or symptoms? Flu symptoms often start all of a sudden. They may last 4-14 days and include: Fever and chills. Headaches, body aches, or muscle aches. Sore throat. Cough. Runny or stuffy nose. Discomfort in your chest. Not wanting to eat as much as normal. Feeling weak or tired. Feeling dizzy. Nausea or  vomiting. How is this diagnosed? The flu may be diagnosed based on your symptoms and medical history. You may also have a physical exam. A swab may be taken from your nose or throat and tested for the virus. How is this treated? If the flu is found early, you can be treated with antiviral medicine. This may be given to you by mouth or through an IV. It can help you feel less sick and get better faster. Taking care of yourself at home can also help your symptoms get better. Your health care provider may tell you to: Take over-the-counter medicines. Drink lots of fluids. The flu often goes away on its own. If you have very bad symptoms or problems caused by the flu, you may need to be treated in a hospital.  Follow these instructions at home: Activity Rest as needed. Get lots of sleep. Stay home from work or school as told by your provider. Leave home only to go see your provider. Do not leave home for other reasons until you don't have a fever for 24 hours without taking medicine. Eating and drinking Take an oral rehydration solution (ORS). This is a drink that is sold at pharmacies and stores. Drink enough fluid to keep your pee pale yellow. Try to drink small amounts of clear fluids. These include water, ice chips, fruit juice mixed with water, and low-calorie sports drinks. Try to eat bland foods that are easy to digest. These include bananas, applesauce, rice, lean meats, toast, and crackers. Avoid drinks that have a lot of sugar or caffeine in them. These include energy drinks, regular sports drinks, and soda. Do not drink alcohol. Do not eat spicy or fatty foods. General instructions     Take your medicines only as told by your provider. Use a cool mist humidifier to add moisture to the air in your home. This can make it easier for you to breathe. You should also clean the humidifier every day. To do so: Empty the water. Pour clean water in. Cover your mouth and nose when you cough  or sneeze. Wash your hands with soap and water often and for at least 20 seconds. It's extra important to do so after you cough or sneeze. If you can't use soap and water, use hand sanitizer. How is this prevented?  Get a flu shot every year. Ask your provider when you should get your flu shot. Stay away from people who are sick during fall and winter. Fall and winter are cold and flu season. Contact a health care provider if: You get new symptoms. You have chest pain. You have watery poop, also called diarrhea. You have a fever. Your cough gets worse. You start to have more mucus. You feel like you may vomit, or you vomit. Get help right away if: You become short of breath or have trouble breathing. Your skin or nails turn blue. You have very bad pain or stiffness in your neck. You get a sudden headache or pain in your face or ear. You vomit each time you eat or drink. These symptoms may be an emergency. Call 911 right away. Do not wait to see if the symptoms will go away. Do not drive yourself to the hospital. This information is not intended to replace advice given to you by your health care provider. Make sure you discuss any questions you have with your health care provider. Document Revised: 04/21/2023 Document Reviewed: 08/26/2022 Elsevier Patient Education  2024 [employer]   If you have been instructed to have an in-person evaluation today at a local Urgent Care facility, please use the link below. It will take you to a list of all of our available [facility], including address, phone number and hours of operation. Please do not delay care.  [facility]  If you or a family member do not have a primary care provider, use the link below to schedule a visit and establish care. When you choose a [place] primary care physician or advanced practice provider, you gain a long-term partner in health. Find a Primary Care Provider  Learn more about  [facility] in-office and virtual care options: [facility] - Get Care Now

## 2023-09-21 ENCOUNTER — Other Ambulatory Visit: Payer: Self-pay | Admitting: Internal Medicine

## 2023-09-22 ENCOUNTER — Ambulatory Visit: Payer: 59 | Admitting: Dermatology

## 2023-09-28 DIAGNOSIS — H60333 Swimmer's ear, bilateral: Secondary | ICD-10-CM | POA: Diagnosis not present

## 2023-09-30 ENCOUNTER — Ambulatory Visit: Payer: 59 | Admitting: Internal Medicine

## 2023-09-30 VITALS — BP 110/70 | HR 90 | Temp 98.0°F | Resp 16 | Ht 66.5 in | Wt 193.2 lb

## 2023-09-30 DIAGNOSIS — E7800 Pure hypercholesterolemia, unspecified: Secondary | ICD-10-CM | POA: Diagnosis not present

## 2023-09-30 DIAGNOSIS — R059 Cough, unspecified: Secondary | ICD-10-CM | POA: Diagnosis not present

## 2023-09-30 DIAGNOSIS — E1040 Type 1 diabetes mellitus with diabetic neuropathy, unspecified: Secondary | ICD-10-CM | POA: Diagnosis not present

## 2023-09-30 DIAGNOSIS — E039 Hypothyroidism, unspecified: Secondary | ICD-10-CM | POA: Diagnosis not present

## 2023-09-30 DIAGNOSIS — G4733 Obstructive sleep apnea (adult) (pediatric): Secondary | ICD-10-CM | POA: Diagnosis not present

## 2023-09-30 DIAGNOSIS — G629 Polyneuropathy, unspecified: Secondary | ICD-10-CM | POA: Diagnosis not present

## 2023-09-30 MED ORDER — TRIAMCINOLONE ACETONIDE 0.1 % EX CREA: 1.0000 | TOPICAL_CREAM | Freq: Two times a day (BID) | CUTANEOUS | 0 refills | Status: DC | PRN

## 2023-09-30 NOTE — Progress Notes (Signed)
 Subjective:    Patient ID: Eric Hayden, male    DOB: Sep 16, 1975, 48 y.o.   MRN: [id number]  Patient here for  Chief Complaint  Patient presents with   Medical Management of Chronic Issues    HPI Here for a scheduled follow up.  Recently evaluated for flu like illness. Treated with tamiflu, promethazine DM and tessalon perles. Had been on abx for site infection. Also being treated for double ear infection. Treated with cipro. Completed medication yesterday. Does feel some better. Still with cough. Coughing fits have improved. Some congestion - head/nose - clear mucus. No sob. Some fatigue. Decreased appetite. Blood sugars have improved some - 150.    Past Medical History:  Diagnosis Date   Asthma    Cancer (HCC)    appendiceal   Chicken pox    GERD (gastroesophageal reflux disease)    Heart murmur    Hx of oral aphthous ulcers    Hypothyroidism    Migraines    Sleep apnea    Type 1 diabetes mellitus (HCC)    Past Surgical History:  Procedure Laterality Date   APPENDECTOMY  2012   COLONOSCOPY WITH PROPOFOL N/A 10/15/2019   Procedure: COLONOSCOPY WITH PROPOFOL;  Surgeon: Wyline Mood, MD;  Location: [facility] ENDOSCOPY;  Service: Gastroenterology;  Laterality: N/A;   ESOPHAGOGASTRODUODENOSCOPY (EGD) WITH PROPOFOL N/A 10/15/2019   Procedure: ESOPHAGOGASTRODUODENOSCOPY (EGD) WITH PROPOFOL;  Surgeon: Wyline Mood, MD;  Location: [facility] ENDOSCOPY;  Service: Gastroenterology;  Laterality: N/A;   TONSILLECTOMY AND ADENOIDECTOMY  2007   UPPER GI ENDOSCOPY     Family History  Problem Relation Age of Onset   Arthritis Maternal Grandmother    Hyperlipidemia Maternal Grandmother    Heart disease Maternal Grandmother    Hypertension Maternal Grandmother    Diabetes Maternal Grandmother        type 2   Heart attack Maternal Grandmother    Arthritis Maternal Grandfather    Heart disease Maternal Grandfather    Hypertension Maternal Grandfather    Heart attack Maternal Grandfather     Arthritis Mother    Hyperlipidemia Mother    Hypertension Mother    Diabetes Mother        type 2   Hyperlipidemia Father    Arthritis Paternal Grandmother    Arthritis Paternal Grandfather    Heart attack Paternal Aunt    Social History   Socioeconomic History   Marital status: Married    Spouse name: Not on file   Number of children: 2   Years of education: Not on file   Highest education level: Not on file  Occupational History   Occupation: Education officer, environmental, [employer]  Tobacco Use   Smoking status: Never   Smokeless tobacco: Never  Vaping Use   Vaping status: Never Used  Substance and Sexual Activity   Alcohol use: No   Drug use: No   Sexual activity: Not on file  Other Topics Concern   Not on file  Social History Narrative   Not on file   Social Drivers of Health   Financial Resource Strain: Not on file  Food Insecurity: Not on file  Transportation Needs: Not on file  Physical Activity: Not on file  Stress: Not on file  Social Connections: Not on file     Review of Systems  Constitutional:  Positive for fatigue. Negative for unexpected weight change.       Decreased appetite.   HENT:  Positive for congestion.  Nasal congestion.   Respiratory:  Positive for cough. Negative for chest tightness and shortness of breath.   Cardiovascular:  Negative for chest pain, palpitations and leg swelling.  Gastrointestinal:  Negative for abdominal pain and vomiting.  Genitourinary:  Negative for difficulty urinating and dysuria.  Musculoskeletal:  Negative for joint swelling and myalgias.  Skin:  Negative for color change and rash.  Neurological:  Negative for dizziness and headaches.  Psychiatric/Behavioral:  Negative for agitation and dysphoric mood.        Objective:     BP 110/70   Pulse 90   Temp 98 F (36.7 C)   Resp 16   Ht 5' 6.5" (1.689 m)   Wt 193 lb 3.2 oz (87.6 kg)   SpO2 97%   BMI 30.72 kg/m  Wt Readings from Last 3  Encounters:  09/30/23 193 lb 3.2 oz (87.6 kg)  05/12/23 191 lb 12.8 oz (87 kg)  02/10/23 194 lb 8 oz (88.2 kg)    Physical Exam Vitals reviewed.  Constitutional:      General: He is not in acute distress.    Appearance: Normal appearance. He is well-developed.  HENT:     Head: Normocephalic and atraumatic.     Right Ear: External ear normal.     Left Ear: External ear normal.  Eyes:     General: No scleral icterus.       Right eye: No discharge.        Left eye: No discharge.     Conjunctiva/sclera: Conjunctivae normal.  Cardiovascular:     Rate and Rhythm: Normal rate and regular rhythm.  Pulmonary:     Effort: Pulmonary effort is normal. No respiratory distress.     Breath sounds: Normal breath sounds.  Abdominal:     General: Bowel sounds are normal.     Palpations: Abdomen is soft.     Tenderness: There is no abdominal tenderness.  Musculoskeletal:        General: No swelling or tenderness.     Cervical back: Neck supple. No tenderness.  Lymphadenopathy:     Cervical: No cervical adenopathy.  Skin:    Findings: No erythema or rash.  Neurological:     Mental Status: He is alert.  Psychiatric:        Mood and Affect: Mood normal.        Behavior: Behavior normal.         Outpatient Encounter Medications as of 09/30/2023  Medication Sig   albuterol (VENTOLIN HFA) 108 (90 Base) MCG/ACT inhaler Inhale 2 puffs into the lungs every 6 (six) hours as needed for wheezing or shortness of breath.   amitriptyline (ELAVIL) 50 MG tablet Take 50 mg by mouth daily.   atorvastatin (LIPITOR) 20 MG tablet TAKE 1 TABLET BY MOUTH DAILY   Blood Glucose Monitoring Suppl (ONETOUCH VERIO FLEX SYSTEM) w/Device KIT See admin instructions.   Cholecalciferol (VITAMIN D3 PO) Take by mouth.   clotrimazole-betamethasone (LOTRISONE) cream Apply 1 Application topically 2 (two) times daily.   Continuous Glucose Sensor (DEXCOM G6 SENSOR) MISC APPLY 1 SENSOR EVERY 10 DAYS   Continuous Glucose  Transmitter (DEXCOM G6 TRANSMITTER) MISC USE FOR 3 MONTHS   CONTOUR NEXT TEST test strip    cyproheptadine (PERIACTIN) 4 MG tablet Take 4 mg by mouth 2 (two) times daily.   folic acid (FOLVITE) 800 MCG tablet Take by mouth.   gabapentin (NEURONTIN) 300 MG capsule TAKE 1 CAPSULE TWICE DAILY   insulin aspart (NOVOLOG) 100 UNIT/ML  injection Inject 160 Units into the skin daily. Inject up to 1.6 mLs (160 Units total) into the skin as directed.   levothyroxine (SYNTHROID) 112 MCG tablet TAKE 1 TABLET BY MOUTH DAILY   omeprazole (PRILOSEC) 20 MG capsule Take 1 capsule (20 mg total) by mouth daily.   Probiotic Product (PROBIOTIC PO) Take by mouth.   topiramate (TOPAMAX) 25 MG tablet TAKE 1 TO 2 TABLETS BY MOUTH EVERY DAY   triamcinolone cream (KENALOG) 0.1 % Apply 1 Application topically 2 (two) times daily as needed (Apply to affected area).   [DISCONTINUED] benzonatate (TESSALON) 100 MG capsule Take 1-2 capsules (100-200 mg total) by mouth 3 (three) times daily as needed.   [DISCONTINUED] doxycycline (VIBRA-TABS) 100 MG tablet Take 1 tablet (100 mg total) by mouth 2 (two) times daily.   [DISCONTINUED] oseltamivir (TAMIFLU) 75 MG capsule Take 1 capsule (75 mg total) by mouth 2 (two) times daily.   [DISCONTINUED] promethazine-dextromethorphan (PROMETHAZINE-DM) 6.25-15 MG/5ML syrup Take 5 mLs by mouth 4 (four) times daily as needed.   [DISCONTINUED] triamcinolone cream (KENALOG) 0.1 % Apply 1 Application topically 2 (two) times daily as needed (Apply to affected area).   No facility-administered encounter medications on file as of 09/30/2023.     Lab Results  Component Value Date   WBC 10.5 02/10/2023   HGB 14.4 02/10/2023   HCT 42.8 02/10/2023   PLT 307.0 02/10/2023   GLUCOSE 202 (H) 02/10/2023   CHOL 148 02/10/2023   TRIG 164.0 (H) 02/10/2023   HDL 32.40 (L) 02/10/2023   LDLCALC 83 02/10/2023   ALT 24 03/11/2023   AST 17 03/11/2023   NA 138 02/10/2023   K 4.4 02/10/2023   CL 106  02/10/2023   CREATININE 1.18 02/10/2023   BUN 12 02/10/2023   CO2 25 02/10/2023   TSH 1.02 02/10/2023   PSA 0.91 02/10/2023   HGBA1C 6.6 (A) 05/12/2023   MICROALBUR <0.7 02/10/2023       Assessment & Plan:  Obstructive sleep apnea Assessment & Plan: CPAP   Type 1 diabetes mellitus with diabetic neuropathy (HCC) Assessment & Plan: Sugars as outlined. Followed by Dr Elvera Lennox. Continue low carb diet and exercise. Follow met b and A1c.  Lab Results  Component Value Date   HGBA1C 6.6 (A) 05/12/2023     Orders: -     Triamcinolone Acetonide; Apply 1 Application topically 2 (two) times daily as needed (Apply to affected area).  Dispense: 45 g; Refill: 0  Neuropathy Assessment & Plan: Tried a short trial - decreased dose of gabapentin. Noticed worsening symptoms. Back on gabapentin. Continue.    Acquired hypothyroidism Assessment & Plan: Continue thyroid replacement. Follow tsh.    Hypercholesterolemia Assessment & Plan: Continue lipitor. Low cholesterol diet and exercise. Follow lipid panel and liver function tests.    Cough, unspecified type Assessment & Plan: Recent flu illness as outlined. Completed recent abx and tamiflu. Persistent cough. No sob. Lungs appear to be clear. Hold on prednisone and hold on further abx. Robitussin DM. Albuterol inhaler as directed. Follow.  Call with update.       Dale Leppert, MD

## 2023-10-01 ENCOUNTER — Encounter: Payer: Self-pay | Admitting: Internal Medicine

## 2023-10-01 NOTE — Assessment & Plan Note (Signed)
 Recent flu illness as outlined. Completed recent abx and tamiflu. Persistent cough. No sob. Lungs appear to be clear. Hold on prednisone and hold on further abx. Robitussin DM. Albuterol inhaler as directed. Follow.  Call with update.

## 2023-10-01 NOTE — Assessment & Plan Note (Signed)
 Continue lipitor.  Low cholesterol diet and exercise.  Follow lipid panel and liver function tests.

## 2023-10-01 NOTE — Assessment & Plan Note (Signed)
 CPAP.

## 2023-10-01 NOTE — Assessment & Plan Note (Signed)
Continue thyroid replacement.  Follow tsh.   

## 2023-10-01 NOTE — Assessment & Plan Note (Signed)
 Tried a short trial - decreased dose of gabapentin. Noticed worsening symptoms. Back on gabapentin. Continue.

## 2023-10-01 NOTE — Assessment & Plan Note (Signed)
 Sugars as outlined. Followed by Dr Elvera Lennox. Continue low carb diet and exercise. Follow met b and A1c.  Lab Results  Component Value Date   HGBA1C 6.6 (A) 05/12/2023

## 2023-10-03 ENCOUNTER — Ambulatory Visit: Payer: 59 | Admitting: Internal Medicine

## 2023-10-18 DIAGNOSIS — E103293 Type 1 diabetes mellitus with mild nonproliferative diabetic retinopathy without macular edema, bilateral: Secondary | ICD-10-CM | POA: Diagnosis not present

## 2023-10-18 LAB — HM DIABETES EYE EXAM

## 2023-11-04 DIAGNOSIS — S46012A Strain of muscle(s) and tendon(s) of the rotator cuff of left shoulder, initial encounter: Secondary | ICD-10-CM | POA: Diagnosis not present

## 2023-11-16 DIAGNOSIS — S46012A Strain of muscle(s) and tendon(s) of the rotator cuff of left shoulder, initial encounter: Secondary | ICD-10-CM | POA: Diagnosis not present

## 2023-11-18 DIAGNOSIS — S46012D Strain of muscle(s) and tendon(s) of the rotator cuff of left shoulder, subsequent encounter: Secondary | ICD-10-CM | POA: Diagnosis not present

## 2023-11-18 DIAGNOSIS — E109 Type 1 diabetes mellitus without complications: Secondary | ICD-10-CM | POA: Diagnosis not present

## 2023-11-21 ENCOUNTER — Encounter: Payer: Self-pay | Admitting: Internal Medicine

## 2023-11-21 DIAGNOSIS — M25512 Pain in left shoulder: Secondary | ICD-10-CM | POA: Insufficient documentation

## 2023-11-30 ENCOUNTER — Ambulatory Visit: Payer: 59 | Admitting: Internal Medicine

## 2023-12-06 DIAGNOSIS — E1042 Type 1 diabetes mellitus with diabetic polyneuropathy: Secondary | ICD-10-CM | POA: Diagnosis not present

## 2023-12-06 DIAGNOSIS — M7542 Impingement syndrome of left shoulder: Secondary | ICD-10-CM | POA: Diagnosis not present

## 2023-12-06 DIAGNOSIS — M25512 Pain in left shoulder: Secondary | ICD-10-CM | POA: Diagnosis not present

## 2023-12-06 DIAGNOSIS — E1010 Type 1 diabetes mellitus with ketoacidosis without coma: Secondary | ICD-10-CM | POA: Diagnosis not present

## 2023-12-06 DIAGNOSIS — Z794 Long term (current) use of insulin: Secondary | ICD-10-CM | POA: Diagnosis not present

## 2023-12-06 DIAGNOSIS — Z9641 Presence of insulin pump (external) (internal): Secondary | ICD-10-CM | POA: Diagnosis not present

## 2023-12-20 ENCOUNTER — Encounter: Payer: Self-pay | Admitting: Dermatology

## 2023-12-20 ENCOUNTER — Ambulatory Visit: Payer: 59 | Admitting: Dermatology

## 2023-12-20 DIAGNOSIS — D1801 Hemangioma of skin and subcutaneous tissue: Secondary | ICD-10-CM | POA: Diagnosis not present

## 2023-12-20 DIAGNOSIS — Z808 Family history of malignant neoplasm of other organs or systems: Secondary | ICD-10-CM

## 2023-12-20 DIAGNOSIS — W908XXA Exposure to other nonionizing radiation, initial encounter: Secondary | ICD-10-CM

## 2023-12-20 DIAGNOSIS — L814 Other melanin hyperpigmentation: Secondary | ICD-10-CM | POA: Diagnosis not present

## 2023-12-20 DIAGNOSIS — L578 Other skin changes due to chronic exposure to nonionizing radiation: Secondary | ICD-10-CM | POA: Diagnosis not present

## 2023-12-20 DIAGNOSIS — L719 Rosacea, unspecified: Secondary | ICD-10-CM | POA: Diagnosis not present

## 2023-12-20 DIAGNOSIS — L738 Other specified follicular disorders: Secondary | ICD-10-CM | POA: Diagnosis not present

## 2023-12-20 DIAGNOSIS — L821 Other seborrheic keratosis: Secondary | ICD-10-CM

## 2023-12-20 DIAGNOSIS — D229 Melanocytic nevi, unspecified: Secondary | ICD-10-CM

## 2023-12-20 DIAGNOSIS — L711 Rhinophyma: Secondary | ICD-10-CM

## 2023-12-20 DIAGNOSIS — Z1283 Encounter for screening for malignant neoplasm of skin: Secondary | ICD-10-CM | POA: Diagnosis not present

## 2023-12-20 DIAGNOSIS — Z7189 Other specified counseling: Secondary | ICD-10-CM

## 2023-12-20 DIAGNOSIS — G43E11 Chronic migraine with aura, intractable, with status migrainosus: Secondary | ICD-10-CM | POA: Diagnosis not present

## 2023-12-20 NOTE — Patient Instructions (Addendum)

## 2023-12-20 NOTE — Progress Notes (Signed)
 Follow-Up Visit   Subjective  Eric Hayden is a 48 y.o. male who presents for the following: Skin Cancer Screening and Full Body Skin Exam Hx of rosacea , not currently using any treatment  The patient presents for Total-Body Skin Exam (TBSE) for skin cancer screening and mole check. The patient has spots, moles and lesions to be evaluated, some may be new or changing and the patient may have concern these could be cancer.  The following portions of the chart were reviewed this encounter and updated as appropriate: medications, allergies, medical history  Review of Systems:  No other skin or systemic complaints except as noted in HPI or Assessment and Plan.  Objective  Well appearing patient in no apparent distress; mood and affect are within normal limits.  A full examination was performed including scalp, head, eyes, ears, nose, lips, neck, chest, axillae, abdomen, back, buttocks, bilateral upper extremities, bilateral lower extremities, hands, feet, fingers, toes, fingernails, and toenails. All findings within normal limits unless otherwise noted below.   Relevant physical exam findings are noted in the Assessment and Plan.   Assessment & Plan   SKIN CANCER SCREENING PERFORMED TODAY.  ACTINIC DAMAGE - Chronic condition, secondary to cumulative UV/sun exposure - diffuse scaly erythematous macules with underlying dyspigmentation - Recommend daily broad spectrum sunscreen SPF 30+ to sun-exposed areas, reapply every 2 hours as needed.  - Staying in the shade or wearing long sleeves, sun glasses (UVA+UVB protection) and wide brim hats (4-inch brim around the entire circumference of the hat) are also recommended for sun protection.  - Call for new or changing lesions.  LENTIGINES, SEBORRHEIC KERATOSES, HEMANGIOMAS - Benign normal skin lesions - Benign-appearing - Call for any changes  Left upper quadrant abdomen - hemangioma   MELANOCYTIC NEVI - Tan-brown and/or  pink-flesh-colored symmetric macules and papules - Benign appearing on exam today - Observation - Call clinic for new or changing moles - Recommend daily use of broad spectrum spf 30+ sunscreen to sun-exposed areas.   Sebaceous Hyperplasia - Small yellow papules with a central dell - Benign-appearing - Observe. Call for changes.  Rosacea with Rhinophyma Face and Nose Exam: Thickening of skin of nose with mild erythema with telangectasias at mid face and nose  Chronic and persistent condition with duration or expected duration over one year. Condition is symptomatic / bothersome to patient. Not to goal. Rosacea is a chronic progressive skin condition usually affecting the face of adults, causing redness and/or acne bumps. It is treatable but not curable. It sometimes affects the eyes (ocular rosacea) as well. It may respond to topical and/or systemic medication and can flare with stress, sun exposure, alcohol, exercise, topical steroids (including hydrocortisone/cortisone 10) and some foods.  Daily application of broad spectrum spf 30+ sunscreen to face is recommended to reduce flares.  Treatment Plan:  Patient declined treatment today  Discussed if flared could prescribe doxycycline  40 mg (20 mg bid) to take daily when flared. Patient will call in future if interested in starting.   Discussed the treatment option of Broad Band Light (BBL) /Intense Pulsed Light (IPL)/ Laser for skin discoloration, including brown spots and redness.  Typically we recommend at least 1-3 treatment sessions about 5-8 weeks apart for best results.  Cannot have tanned skin when BBL performed, and regular use of sunscreen/photoprotection is advised after the procedure to help maintain results. The patient's condition may also require "maintenance treatments" in the future.  The fee for BBL / laser treatments is $350  per treatment session for the whole face.  A fee can be quoted for other parts of the body.  Insurance  typically does not pay for BBL/laser treatments and therefore the fee is an out-of-pocket cost. Recommend prophylactic valtrex treatment. Once scheduled for procedure, will send Rx in prior to patient's appointment.   FAMILY HISTORY OF SKIN CANCER What type(s):melanoma  Who affected:father   Return in about 1 year (around 12/19/2024) for TBSE.  IRandee Busing, CMA, am acting as scribe for Celine Collard, MD.   Documentation: I have reviewed the above documentation for accuracy and completeness, and I agree with the above.  Celine Collard, MD

## 2023-12-21 DIAGNOSIS — G43E11 Chronic migraine with aura, intractable, with status migrainosus: Secondary | ICD-10-CM | POA: Diagnosis not present

## 2023-12-21 DIAGNOSIS — G43109 Migraine with aura, not intractable, without status migrainosus: Secondary | ICD-10-CM | POA: Diagnosis not present

## 2023-12-23 DIAGNOSIS — M25512 Pain in left shoulder: Secondary | ICD-10-CM | POA: Diagnosis not present

## 2023-12-23 DIAGNOSIS — M7542 Impingement syndrome of left shoulder: Secondary | ICD-10-CM | POA: Diagnosis not present

## 2023-12-26 ENCOUNTER — Other Ambulatory Visit: Payer: Self-pay | Admitting: Internal Medicine

## 2023-12-29 DIAGNOSIS — M7542 Impingement syndrome of left shoulder: Secondary | ICD-10-CM | POA: Diagnosis not present

## 2023-12-29 DIAGNOSIS — M25512 Pain in left shoulder: Secondary | ICD-10-CM | POA: Diagnosis not present

## 2024-01-09 ENCOUNTER — Other Ambulatory Visit: Payer: Self-pay | Admitting: Internal Medicine

## 2024-01-12 DIAGNOSIS — M7542 Impingement syndrome of left shoulder: Secondary | ICD-10-CM | POA: Diagnosis not present

## 2024-02-07 ENCOUNTER — Other Ambulatory Visit: Payer: Self-pay | Admitting: Internal Medicine

## 2024-02-08 DIAGNOSIS — F4323 Adjustment disorder with mixed anxiety and depressed mood: Secondary | ICD-10-CM | POA: Diagnosis not present

## 2024-02-13 ENCOUNTER — Ambulatory Visit: Admitting: Internal Medicine

## 2024-02-14 DIAGNOSIS — F4323 Adjustment disorder with mixed anxiety and depressed mood: Secondary | ICD-10-CM | POA: Diagnosis not present

## 2024-02-15 ENCOUNTER — Ambulatory Visit
Admission: RE | Admit: 2024-02-15 | Discharge: 2024-02-15 | Disposition: A | Discharge status: Home / Self Care | Attending: Emergency Medicine | Admitting: Emergency Medicine

## 2024-02-15 ENCOUNTER — Ambulatory Visit

## 2024-02-15 VITALS — BP 138/85 | HR 91 | Temp 98.7°F | Resp 18 | Ht 67.0 in | Wt 195.0 lb

## 2024-02-15 DIAGNOSIS — L237 Allergic contact dermatitis due to plants, except food: Secondary | ICD-10-CM | POA: Diagnosis not present

## 2024-02-15 MED ORDER — DEXAMETHASONE SODIUM PHOSPHATE 10 MG/ML IJ SOLN
10.0000 mg | Freq: Once | INTRAMUSCULAR | Status: AC
  Administered 2024-02-15: 10 mg via INTRAMUSCULAR

## 2024-02-15 MED ORDER — TRIAMCINOLONE ACETONIDE 0.1 % EX CREA: 1.0000 | TOPICAL_CREAM | Freq: Two times a day (BID) | CUTANEOUS | 0 refills | Status: AC

## 2024-02-15 NOTE — Discharge Instructions (Addendum)
 You were given an injection of dexamethasone  here today.  Monitor your blood sugars closely and adjust your insulin  as needed.    Use the triamcinolone  cream as directed.    Take Claritin and Zyrtec as directed.    Follow-up with your primary care provider if your symptoms are not improving.

## 2024-02-15 NOTE — ED Provider Notes (Signed)
 Eric Hayden    CSN: [id number] Arrival date & time: 02/15/24  1416      History   Chief Complaint Chief Complaint  Patient presents with   Poison Ivy    Entered by patient    HPI Eric Hayden is a 48 y.o. male.  Patient presents with 4-day history of extremely pruritic poison ivy rash on both lower legs that is spreading.  No fever or purulent drainage.  He has attempted treatment with topical Benadryl and cortisone cream without relief.  He reports history of similar rash when coming in contact with poison ivy in the past.  He is a type I diabetic with an insulin  pump and continuous blood glucose monitor.  The history is provided by the patient and medical records.    Past Medical History:  Diagnosis Date   Asthma    Cancer (HCC)    appendiceal   Chicken pox    GERD (gastroesophageal reflux disease)    Heart murmur    Hx of oral aphthous ulcers    Hypothyroidism    Migraines    Sleep apnea    Type 1 diabetes mellitus [facility])     Patient Active Problem List   Diagnosis Date Noted   Left shoulder pain 11/21/2023   Mixed hyperlipidemia 03/01/2022   DM type 1 with diabetic peripheral neuropathy (HCC) 11/03/2021   Earache 07/08/2021   Celiac disease 07/07/2021   History of COVID-19 03/02/2021   Altered level of consciousness 05/22/2017   Syncope 05/17/2017   Cough 05/07/2016   Dizziness 04/12/2016   Elevated alkaline phosphatase level 08/17/2015   Health care maintenance 12/09/2014   Chest pain 12/09/2014   Headache 05/20/2014   Abdominal pain 05/20/2014   Back pain 08/19/2013   Type 1 diabetes mellitus (HCC) 11/18/2012   Hypercholesterolemia 11/18/2012   Neuropathy 11/18/2012   Obstructive sleep apnea 11/18/2012   GERD (gastroesophageal reflux disease) 11/18/2012   Hypothyroidism 11/18/2012    Past Surgical History:  Procedure Laterality Date   APPENDECTOMY  2012   COLONOSCOPY WITH PROPOFOL  N/A 10/15/2019   Procedure: COLONOSCOPY WITH  PROPOFOL ;  Surgeon: Therisa Bi, MD;  Location: [facility] ENDOSCOPY;  Service: Gastroenterology;  Laterality: N/A;   ESOPHAGOGASTRODUODENOSCOPY (EGD) WITH PROPOFOL  N/A 10/15/2019   Procedure: ESOPHAGOGASTRODUODENOSCOPY (EGD) WITH PROPOFOL ;  Surgeon: Therisa Bi, MD;  Location: [facility] ENDOSCOPY;  Service: Gastroenterology;  Laterality: N/A;   TONSILLECTOMY AND ADENOIDECTOMY  2007   UPPER GI ENDOSCOPY         Home Medications    Prior to Admission medications   Medication Sig Start Date End Date Taking? Authorizing Provider  triamcinolone  cream (KENALOG ) 0.1 % Apply 1 Application topically 2 (two) times daily. 02/15/24  Yes Corlis Burnard DEL, NP  albuterol  (VENTOLIN  HFA) 108 (90 Base) MCG/ACT inhaler Inhale 2 puffs into the lungs every 6 (six) hours as needed for wheezing or shortness of breath. 02/04/22   Glendia Shad, MD  amitriptyline  (ELAVIL ) 50 MG tablet TAKE 1 TABLET BY MOUTH DAILY 02/07/24   Glendia Shad, MD  atorvastatin  (LIPITOR) 20 MG tablet TAKE 1 TABLET BY MOUTH DAILY 02/23/23   Glendia Shad, MD  Blood Glucose Monitoring Suppl (ONETOUCH VERIO FLEX SYSTEM) w/Device KIT See admin instructions. 12/10/19   [provider]  Cholecalciferol (VITAMIN D3 PO) Take by mouth.    [provider]  clotrimazole -betamethasone  (LOTRISONE ) cream Apply 1 Application topically 2 (two) times daily. 02/10/23   Glendia Shad, MD  Continuous Glucose Sensor (DEXCOM G6 SENSOR) MISC APPLY  1 SENSOR EVERY 10 DAYS 01/11/24   Trixie File, MD  Continuous Glucose Transmitter (DEXCOM G6 TRANSMITTER) MISC USE FOR 3 MONTHS 01/31/23   Trixie File, MD  CONTOUR NEXT TEST test strip  12/12/17   [provider]  cyproheptadine (PERIACTIN) 4 MG tablet Take 4 mg by mouth 2 (two) times daily.    [provider]  folic acid (FOLVITE) 800 MCG tablet Take by mouth.    [provider]  gabapentin  (NEURONTIN ) 300 MG capsule TAKE 1 CAPSULE TWICE DAILY 08/04/23   Glendia Shad, MD   insulin  aspart (NOVOLOG ) 100 UNIT/ML injection Inject 160 Units into the skin daily. Inject up to 1.6 mLs (160 Units total) into the skin as directed. 06/17/23   Trixie File, MD  levothyroxine  (SYNTHROID ) 112 MCG tablet TAKE 1 TABLET BY MOUTH ONCE DAILY 12/27/23   Trixie File, MD  omeprazole  (PRILOSEC) 20 MG capsule Take 1 capsule (20 mg total) by mouth daily. 02/10/23   Glendia Shad, MD  Probiotic Product (PROBIOTIC PO) Take by mouth.    [provider]  topiramate  (TOPAMAX ) 25 MG tablet TAKE 1 TO 2 TABLETS BY MOUTH EVERY DAY 04/10/21   Glendia Shad, MD    Family History Family History  Problem Relation Age of Onset   Arthritis Maternal Grandmother    Hyperlipidemia Maternal Grandmother    Heart disease Maternal Grandmother    Hypertension Maternal Grandmother    Diabetes Maternal Grandmother        type 2   Heart attack Maternal Grandmother    Arthritis Maternal Grandfather    Heart disease Maternal Grandfather    Hypertension Maternal Grandfather    Heart attack Maternal Grandfather    Arthritis Mother    Hyperlipidemia Mother    Hypertension Mother    Diabetes Mother        type 2   Hyperlipidemia Father    Arthritis Paternal Grandmother    Arthritis Paternal Grandfather    Heart attack Paternal Aunt     Social History Social History   Tobacco Use   Smoking status: Never   Smokeless tobacco: Never  Vaping Use   Vaping status: Never Used  Substance Use Topics   Alcohol use: No   Drug use: No     Allergies   Augmentin [amoxicillin-pot clavulanate] and Levaquin [levofloxacin in d5w]   Review of Systems Review of Systems  Constitutional:  Negative for chills and fever.  Musculoskeletal:  Negative for arthralgias and joint swelling.  Skin:  Positive for color change and rash.     Physical Exam Triage Vital Signs ED Triage Vitals  Encounter Vitals Group     BP 02/15/24 1432 138/85     Girls Systolic BP Percentile --      Girls  Diastolic BP Percentile --      Boys Systolic BP Percentile --      Boys Diastolic BP Percentile --      Pulse Rate 02/15/24 1432 91     Resp 02/15/24 1432 18     Temp 02/15/24 1432 98.7 F (37.1 C)     Temp src --      SpO2 02/15/24 1432 94 %     Weight 02/15/24 1429 195 lb (88.5 kg)     Height 02/15/24 1429 5' 7 (1.702 m)     Head Circumference --      Peak Flow --      Pain Score 02/15/24 1429 0     Pain Loc --  Pain Education --      Exclude from Growth Chart --    No data found.  Updated Vital Signs BP 138/85   Pulse 91   Temp 98.7 F (37.1 C)   Resp 18   Ht 5' 7 (1.702 m)   Wt 195 lb (88.5 kg)   SpO2 94%   BMI 30.54 kg/m   Visual Acuity Right Eye Distance:   Left Eye Distance:   Bilateral Distance:    Right Eye Near:   Left Eye Near:    Bilateral Near:     Physical Exam Constitutional:      General: He is not in acute distress. HENT:     Mouth/Throat:     Mouth: Mucous membranes are moist.  Cardiovascular:     Rate and Rhythm: Normal rate and regular rhythm.  Pulmonary:     Effort: Pulmonary effort is normal. No respiratory distress.  Musculoskeletal:        General: No swelling. Normal range of motion.  Skin:    General: Skin is warm and dry.     Findings: Rash present.     Comments: Patchy erythematous papular and vesicular rash on both lower legs.  No purulent drainage.  Neurological:     Mental Status: He is alert.      UC Treatments / Results  Labs (all labs ordered are listed, but only abnormal results are displayed) Labs Reviewed - No data to display  EKG   Radiology No results found.  Procedures Procedures (including critical care time)  Medications Ordered in UC Medications  dexamethasone  (DECADRON ) injection 10 mg (10 mg Intramuscular Given 02/15/24 1455)    Initial Impression / Assessment and Plan / UC Course  I have reviewed the triage vital signs and the nursing notes.  Pertinent labs & imaging results that  were available during my care of the patient were reviewed by me and considered in my medical decision making (see chart for details).    Poison ivy dermatitis.  Injection of dexamethasone  given here.  Patient has had this in the past with good relief.  He is able to monitor his blood sugars and adjust his insulin  as needed and reports he has done well with this in the past.  Discussed antihistamine including Claritin in the morning and Zyrtec at bedtime.  Also treating with triamcinolone  cream.  Education provided on poison ivy dermatitis.  Instructed patient to follow-up with his PCP if he is not improving.  He agrees to plan of care.  Final Clinical Impressions(s) / UC Diagnoses   Final diagnoses:  Poison ivy dermatitis     Discharge Instructions      You were given an injection of dexamethasone  here today.  Monitor your blood sugars closely and adjust your insulin  as needed.    Use the triamcinolone  cream as directed.    Take Claritin and Zyrtec as directed.    Follow-up with your primary care provider if your symptoms are not improving.      ED Prescriptions     Medication Sig Dispense Auth. Provider   triamcinolone  cream (KENALOG ) 0.1 % Apply 1 Application topically 2 (two) times daily. 30 g Corlis Burnard DEL, NP      PDMP not reviewed this encounter.   Corlis Burnard DEL, NP 02/15/24 [phone]

## 2024-02-15 NOTE — ED Triage Notes (Signed)
 Patient in office today complaint of bilateral lower legs and arm possible poison oak or poison ivy x4d ago  OTC: benadryl(generic), Cortisone   Denies: N/V and HA

## 2024-02-23 DIAGNOSIS — E1042 Type 1 diabetes mellitus with diabetic polyneuropathy: Secondary | ICD-10-CM | POA: Diagnosis not present

## 2024-02-23 DIAGNOSIS — Z9641 Presence of insulin pump (external) (internal): Secondary | ICD-10-CM | POA: Diagnosis not present

## 2024-02-23 DIAGNOSIS — Z794 Long term (current) use of insulin: Secondary | ICD-10-CM | POA: Diagnosis not present

## 2024-02-23 DIAGNOSIS — E1010 Type 1 diabetes mellitus with ketoacidosis without coma: Secondary | ICD-10-CM | POA: Diagnosis not present

## 2024-02-24 ENCOUNTER — Encounter: Payer: Self-pay | Admitting: Internal Medicine

## 2024-02-26 ENCOUNTER — Other Ambulatory Visit: Payer: Self-pay | Admitting: Internal Medicine

## 2024-02-27 ENCOUNTER — Ambulatory Visit: Payer: Self-pay

## 2024-02-27 NOTE — Telephone Encounter (Signed)
 LM for patient

## 2024-02-27 NOTE — Telephone Encounter (Signed)
 See mychart.

## 2024-02-27 NOTE — Telephone Encounter (Signed)
 Reviewed. Please get more information about the unilateral leg swelling - to confirm ok to wait until tomorrow.

## 2024-02-27 NOTE — Telephone Encounter (Signed)
 He says that swelling is more his foot than his legs. No tenderness. The swelling goes down with elevation. NO sob, chest tightness, etc. His left leg is where the poison oak started on left calf and has been the worst area. The rash is now bilateral from knee down. Not on his feet. Has used triamcinolone  cream. He will see you at 8:30 in the morning.

## 2024-02-27 NOTE — Telephone Encounter (Signed)
 Refill request complete

## 2024-02-27 NOTE — Telephone Encounter (Signed)
 FYI, patient is seeing you tomorrow for acute visit. I will send a triage note from E2C2 as well.

## 2024-02-27 NOTE — Telephone Encounter (Signed)
 LM to try to schedule acute appointment. If we have availability, schedule him with a provider here. If not, can check other offices if agreeable.

## 2024-02-27 NOTE — Telephone Encounter (Signed)
 See other note

## 2024-02-27 NOTE — Telephone Encounter (Signed)
 FYI Only or Action Required?: FYI only for provider.  Patient was last seen in primary care on 09/30/2023 by Glendia Shad, MD.  Called Nurse Triage reporting Rash.  Symptoms began several weeks ago.  Interventions attempted: OTC medications: antihistamine and Prescription medications: steroid shot and cream.  Symptoms are: gradually worsening.  Triage Disposition: See HCP Within 4 Hours (Or PCP Triage)  Patient/caregiver understands and will follow disposition?: Yes  Pt offered telehealth visit for today, decline and states he will wait to be seen in person on tomorrow. Instructed if rash worsens to got back to UC.     Copied from CRM [id number]. Topic: Clinical - Red Word Triage >> Feb 27, 2024  8:15 AM Larissa RAMAN wrote: [facility] that prompted transfer to Nurse Triage:leg infection and swelling- worsening Reason for Disposition  Rash involves more than 20% of the body  Answer Assessment - Initial Assessment Questions 1. APPEARANCE of RASH: What does the rash look like?      Spotty red and deep red spot on calf of left leg that bumpy 2. LOCATION: Where is the rash located?  (e.g., face, genitals, hands, legs)     Both legs, and one spot on abd and hip 3. SIZE: How large is the rash?       Spreading and on legs and feet 4. ONSET: When did the rash begin?      2 weeks ago  5. ITCHING: Does the rash itch? If Yes, ask: How bad is it?     Mod- 6. EXPOSURE:  How were you exposed to the plant (poison ivy, poison oak, sumac)  When were you exposed?  Note: Sometimes a poison ivy/oak/sumac rash does not appear for 2 to 3 weeks after exposure.      poison 7. PAST HISTORY: Have you had a poison ivy rash before? If Yes, ask: How bad was it?     Yes last year 8. OTHER SYMPTOMS: Do you have any other symptoms? (e.g., fever)      Swelling and pain in left foot  Protocols used: Poison Ivy - Oak - Sumac-A-AH

## 2024-02-27 NOTE — Telephone Encounter (Signed)
 Pt is seeing you tomorrow morning. Declined appt today per note.

## 2024-02-27 NOTE — Telephone Encounter (Unsigned)
 Copied from CRM [id number]. Topic: General - Other >> Feb 27, 2024  3:32 PM Berneda FALCON wrote: Reason for CRM: Pt is returning phone call to [place]. Called the CAL and was informed she may be in with a patient right now.  Please call patient back at [phone]

## 2024-02-27 NOTE — Telephone Encounter (Signed)
 See my chart message.  Need more information regarding unilateral leg swelling and redness.

## 2024-02-28 ENCOUNTER — Ambulatory Visit: Admitting: Internal Medicine

## 2024-02-28 ENCOUNTER — Encounter: Payer: Self-pay | Admitting: Internal Medicine

## 2024-02-28 ENCOUNTER — Ambulatory Visit (INDEPENDENT_AMBULATORY_CARE_PROVIDER_SITE_OTHER)

## 2024-02-28 VITALS — BP 126/70 | HR 72 | Resp 16 | Ht 67.0 in | Wt 190.0 lb

## 2024-02-28 DIAGNOSIS — E1040 Type 1 diabetes mellitus with diabetic neuropathy, unspecified: Secondary | ICD-10-CM | POA: Diagnosis not present

## 2024-02-28 DIAGNOSIS — M79672 Pain in left foot: Secondary | ICD-10-CM

## 2024-02-28 DIAGNOSIS — R21 Rash and other nonspecific skin eruption: Secondary | ICD-10-CM | POA: Diagnosis not present

## 2024-02-28 DIAGNOSIS — M79675 Pain in left toe(s): Secondary | ICD-10-CM | POA: Diagnosis not present

## 2024-02-28 MED ORDER — PREDNISONE 10 MG PO TABS: ORAL_TABLET | ORAL | 0 refills | Status: DC

## 2024-02-28 NOTE — Assessment & Plan Note (Signed)
 Appears to be c/w contact dermatitis. Persistent. Will stop topical steroids. Prednisone  taper as directed.  Continue antihistamine. Follow. Call with update.

## 2024-02-28 NOTE — Assessment & Plan Note (Signed)
 Persistent toe/foot pain as outlined. Did have an injury - low footing on step in his garage. Check xray. Further w/up pending results.

## 2024-02-28 NOTE — Assessment & Plan Note (Signed)
 Discussed prednisone  - increasing blood sugars. He has taken previously. Can adjust insulin . Follow sugars closely. Stay hydrated.  Lab Results  Component Value Date   HGBA1C 6.6 (A) 05/12/2023

## 2024-02-28 NOTE — Progress Notes (Signed)
 Subjective:    Patient ID: Eric Hayden, male    DOB: 07/12/1976, 48 y.o.   MRN: [id number]  Patient here for  Chief Complaint  Patient presents with   Rash    HPI Here for work in appt - work in with concerns regarding persistent rash - lower extremities. Was seen 02/15/24 - urgent care - with 4 day histor yof pruritic poison ivy rash - bilateal lower extremities. Diagnosed with poison ivy dermatitis. Treated with dexamethasone  injection and topicall TCC. Also instructed to take claritin/zyrtec. He has been this regimen for two weeks. Rash is worsening. Increased itching. More localized to lower extremities - left > right. Also reports increased pain - left great toe and extending up left metatarsal. Standing a lot this weekend. Increased swelling of left foot. No swelling extending up the leg. No sob. Minimal rash - lower abdomen.    Past Medical History:  Diagnosis Date   Asthma    Cancer (HCC)    appendiceal   Chicken pox    GERD (gastroesophageal reflux disease)    Heart murmur    Hx of oral aphthous ulcers    Hypothyroidism    Migraines    Sleep apnea    Type 1 diabetes mellitus (HCC)    Past Surgical History:  Procedure Laterality Date   APPENDECTOMY  2012   COLONOSCOPY WITH PROPOFOL  N/A 10/15/2019   Procedure: COLONOSCOPY WITH PROPOFOL ;  Surgeon: Therisa Bi, MD;  Location: [facility] ENDOSCOPY;  Service: Gastroenterology;  Laterality: N/A;   ESOPHAGOGASTRODUODENOSCOPY (EGD) WITH PROPOFOL  N/A 10/15/2019   Procedure: ESOPHAGOGASTRODUODENOSCOPY (EGD) WITH PROPOFOL ;  Surgeon: Therisa Bi, MD;  Location: [facility] ENDOSCOPY;  Service: Gastroenterology;  Laterality: N/A;   TONSILLECTOMY AND ADENOIDECTOMY  2007   UPPER GI ENDOSCOPY     Family History  Problem Relation Age of Onset   Arthritis Maternal Grandmother    Hyperlipidemia Maternal Grandmother    Heart disease Maternal Grandmother    Hypertension Maternal Grandmother    Diabetes Maternal Grandmother        type 2   Heart  attack Maternal Grandmother    Arthritis Maternal Grandfather    Heart disease Maternal Grandfather    Hypertension Maternal Grandfather    Heart attack Maternal Grandfather    Arthritis Mother    Hyperlipidemia Mother    Hypertension Mother    Diabetes Mother        type 2   Hyperlipidemia Father    Arthritis Paternal Grandmother    Arthritis Paternal Grandfather    Heart attack Paternal Aunt    Social History   Socioeconomic History   Marital status: Married    Spouse name: Not on file   Number of children: 2   Years of education: Not on file   Highest education level: Not on file  Occupational History   Occupation: Education officer, environmental, [employer]  Tobacco Use   Smoking status: Never   Smokeless tobacco: Never  Vaping Use   Vaping status: Never Used  Substance and Sexual Activity   Alcohol use: No   Drug use: No   Sexual activity: Not on file  Other Topics Concern   Not on file  Social History Narrative   Not on file   Social Drivers of Health   Financial Resource Strain: Not on file  Food Insecurity: Not on file  Transportation Needs: Not on file  Physical Activity: Not on file  Stress: Not on file  Social Connections: Not on file  Review of Systems  Constitutional:  Negative for appetite change, fever and unexpected weight change.  HENT:  Negative for congestion and sinus pressure.   Respiratory:  Negative for cough, chest tightness and shortness of breath.   Cardiovascular:  Negative for chest pain and palpitations.       No leg swelling.   Gastrointestinal:  Negative for abdominal pain, diarrhea, nausea and vomiting.  Genitourinary:  Negative for difficulty urinating and dysuria.  Musculoskeletal:  Negative for joint swelling and myalgias.  Skin:  Positive for rash.  Neurological:  Positive for dizziness.  Psychiatric/Behavioral:  Negative for agitation and dysphoric mood.        Objective:     BP 126/70   Pulse 72   Resp 16   Ht 5'  7 (1.702 m)   Wt 190 lb (86.2 kg)   SpO2 98%   BMI 29.76 kg/m  Wt Readings from Last 3 Encounters:  02/28/24 190 lb (86.2 kg)  02/15/24 195 lb (88.5 kg)  09/30/23 193 lb 3.2 oz (87.6 kg)    Physical Exam Constitutional:      General: He is not in acute distress.    Appearance: Normal appearance. He is well-developed.  HENT:     Head: Normocephalic and atraumatic.     Right Ear: External ear normal.     Left Ear: External ear normal.  Eyes:     General: No scleral icterus.       Right eye: No discharge.        Left eye: No discharge.  Cardiovascular:     Rate and Rhythm: Normal rate and regular rhythm.  Pulmonary:     Effort: Pulmonary effort is normal. No respiratory distress.     Breath sounds: Normal breath sounds.  Abdominal:     Palpations: Abdomen is soft.     Tenderness: There is no abdominal tenderness.  Musculoskeletal:     Cervical back: Neck supple. No tenderness.     Comments: Increased soft tissue swelling - left foot. Increased pain to palpation - left great toe extending up left first metatarsal. No increased redness or warmth.  DP pulses palpable and equal bilateral.   Lymphadenopathy:     Cervical: No cervical adenopathy.  Skin:    Comments: Increased erythematous rash - lower extremities - left greater than right - more localized posterior legs - left calf > right. Minimal rash = lower abdomen.   Neurological:     Mental Status: He is alert.  Psychiatric:        Mood and Affect: Mood normal.        Behavior: Behavior normal.         Outpatient Encounter Medications as of 02/28/2024  Medication Sig   predniSONE  (DELTASONE ) 10 MG tablet Take 6 tablets x 1 day and then decrease by 1/2 tablet per day until down to zero mg.   amitriptyline  (ELAVIL ) 50 MG tablet TAKE 1 TABLET BY MOUTH DAILY   atorvastatin  (LIPITOR) 20 MG tablet TAKE 1 TABLET BY MOUTH DAILY   Blood Glucose Monitoring Suppl (ONETOUCH VERIO FLEX SYSTEM) w/Device KIT See admin  instructions.   Cholecalciferol (VITAMIN D3 PO) Take by mouth.   clotrimazole -betamethasone  (LOTRISONE ) cream Apply 1 Application topically 2 (two) times daily.   Continuous Glucose Sensor (DEXCOM G6 SENSOR) MISC APPLY 1 SENSOR EVERY 10 DAYS   Continuous Glucose Transmitter (DEXCOM G6 TRANSMITTER) MISC USE FOR 3 MONTHS   CONTOUR NEXT TEST test strip    cyproheptadine (PERIACTIN) 4 MG  tablet Take 4 mg by mouth 2 (two) times daily.   folic acid (FOLVITE) 800 MCG tablet Take by mouth.   gabapentin  (NEURONTIN ) 300 MG capsule TAKE 1 CAPSULE TWICE DAILY   insulin  aspart (NOVOLOG ) 100 UNIT/ML injection Inject 160 Units into the skin daily. Inject up to 1.6 mLs (160 Units total) into the skin as directed.   levothyroxine  (SYNTHROID ) 112 MCG tablet TAKE 1 TABLET BY MOUTH DAILY   omeprazole  (PRILOSEC) 20 MG capsule Take 1 capsule (20 mg total) by mouth daily.   Probiotic Product (PROBIOTIC PO) Take by mouth.   topiramate  (TOPAMAX ) 25 MG tablet TAKE 1 TO 2 TABLETS BY MOUTH EVERY DAY   triamcinolone  cream (KENALOG ) 0.1 % Apply 1 Application topically 2 (two) times daily.   [DISCONTINUED] albuterol  (VENTOLIN  HFA) 108 (90 Base) MCG/ACT inhaler Inhale 2 puffs into the lungs every 6 (six) hours as needed for wheezing or shortness of breath.   No facility-administered encounter medications on file as of 02/28/2024.     Lab Results  Component Value Date   WBC 10.5 02/10/2023   HGB 14.4 02/10/2023   HCT 42.8 02/10/2023   PLT 307.0 02/10/2023   GLUCOSE 202 (H) 02/10/2023   CHOL 148 02/10/2023   TRIG 164.0 (H) 02/10/2023   HDL 32.40 (L) 02/10/2023   LDLCALC 83 02/10/2023   ALT 24 03/11/2023   AST 17 03/11/2023   NA 138 02/10/2023   K 4.4 02/10/2023   CL 106 02/10/2023   CREATININE 1.18 02/10/2023   BUN 12 02/10/2023   CO2 25 02/10/2023   TSH 1.02 02/10/2023   PSA 0.91 02/10/2023   HGBA1C 6.6 (A) 05/12/2023       Assessment & Plan:  Left foot pain Assessment & Plan: Persistent toe/foot pain  as outlined. Did have an injury - low footing on step in his garage. Check xray. Further w/up pending results.   Orders: -     DG Foot 2 Views Left; Future  Rash Assessment & Plan: Appears to be c/w contact dermatitis. Persistent. Will stop topical steroids. Prednisone  taper as directed.  Continue antihistamine. Follow. Call with update.    Type 1 diabetes mellitus with diabetic neuropathy (HCC) Assessment & Plan: Discussed prednisone  - increasing blood sugars. He has taken previously. Can adjust insulin . Follow sugars closely. Stay hydrated.  Lab Results  Component Value Date   HGBA1C 6.6 (A) 05/12/2023      Other orders -     predniSONE ; Take 6 tablets x 1 day and then decrease by 1/2 tablet per day until down to zero mg.  Dispense: 39 tablet; Refill: 0     Allena Hamilton, MD

## 2024-02-29 ENCOUNTER — Ambulatory Visit: Payer: Self-pay | Admitting: Internal Medicine

## 2024-02-29 DIAGNOSIS — F4323 Adjustment disorder with mixed anxiety and depressed mood: Secondary | ICD-10-CM | POA: Diagnosis not present

## 2024-03-02 DIAGNOSIS — G43809 Other migraine, not intractable, without status migrainosus: Secondary | ICD-10-CM | POA: Diagnosis not present

## 2024-03-05 ENCOUNTER — Other Ambulatory Visit: Payer: Self-pay | Admitting: Internal Medicine

## 2024-03-06 ENCOUNTER — Other Ambulatory Visit: Payer: Self-pay | Admitting: Internal Medicine

## 2024-03-07 ENCOUNTER — Other Ambulatory Visit: Payer: Self-pay | Admitting: Internal Medicine

## 2024-03-13 DIAGNOSIS — F4323 Adjustment disorder with mixed anxiety and depressed mood: Secondary | ICD-10-CM | POA: Diagnosis not present

## 2024-03-19 DIAGNOSIS — M5412 Radiculopathy, cervical region: Secondary | ICD-10-CM | POA: Diagnosis not present

## 2024-03-19 DIAGNOSIS — E109 Type 1 diabetes mellitus without complications: Secondary | ICD-10-CM | POA: Diagnosis not present

## 2024-03-19 DIAGNOSIS — M4050 Lordosis, unspecified, site unspecified: Secondary | ICD-10-CM | POA: Diagnosis not present

## 2024-03-24 ENCOUNTER — Other Ambulatory Visit: Payer: Self-pay | Admitting: Internal Medicine

## 2024-03-27 DIAGNOSIS — F4323 Adjustment disorder with mixed anxiety and depressed mood: Secondary | ICD-10-CM | POA: Diagnosis not present

## 2024-03-29 ENCOUNTER — Ambulatory Visit (INDEPENDENT_AMBULATORY_CARE_PROVIDER_SITE_OTHER): Payer: 59 | Admitting: Internal Medicine

## 2024-03-29 DIAGNOSIS — Z0000 Encounter for general adult medical examination without abnormal findings: Secondary | ICD-10-CM

## 2024-03-29 DIAGNOSIS — R519 Headache, unspecified: Secondary | ICD-10-CM

## 2024-03-29 DIAGNOSIS — Z125 Encounter for screening for malignant neoplasm of prostate: Secondary | ICD-10-CM

## 2024-03-29 NOTE — Assessment & Plan Note (Signed)
 Physical today 03/29/24.  Colonoscopy 10/15/19 - celiac and removal of polyp (tubular adenoma).  Recommended f/u colonoscopy in 5 years. Check psa.

## 2024-03-29 NOTE — Progress Notes (Deleted)
 Subjective:    Patient ID: Eric Hayden, male    DOB: Dec 10, 1975, 48 y.o.   MRN: [id number]  Patient here for No chief complaint on file.   HPI Here for a physical exam. Has had persistent issues with vestibular migraines. Currently on etodolac, naratriptan and ondansetron for abortive therapy. For preventative therapy, he is on topamax  and recently started vylepty infusions. Also started on betahistine. MRI brain - 4/23 - normal. Had f/u 03/02/24 - started verapamil 120mg . Recommended to taper off topamax . Recommended to referral to vestibular PT. Recently evaluated by me for persistent rash 02/28/24 - placed on prednisone .    Past Medical History:  Diagnosis Date   Asthma    Cancer (HCC)    appendiceal   Chicken pox    GERD (gastroesophageal reflux disease)    Heart murmur    Hx of oral aphthous ulcers    Hypothyroidism    Migraines    Sleep apnea    Type 1 diabetes mellitus (HCC)    Past Surgical History:  Procedure Laterality Date   APPENDECTOMY  2012   COLONOSCOPY WITH PROPOFOL  N/A 10/15/2019   Procedure: COLONOSCOPY WITH PROPOFOL ;  Surgeon: Therisa Bi, MD;  Location: [facility] ENDOSCOPY;  Service: Gastroenterology;  Laterality: N/A;   ESOPHAGOGASTRODUODENOSCOPY (EGD) WITH PROPOFOL  N/A 10/15/2019   Procedure: ESOPHAGOGASTRODUODENOSCOPY (EGD) WITH PROPOFOL ;  Surgeon: Therisa Bi, MD;  Location: [facility] ENDOSCOPY;  Service: Gastroenterology;  Laterality: N/A;   TONSILLECTOMY AND ADENOIDECTOMY  2007   UPPER GI ENDOSCOPY     Family History  Problem Relation Age of Onset   Arthritis Maternal Grandmother    Hyperlipidemia Maternal Grandmother    Heart disease Maternal Grandmother    Hypertension Maternal Grandmother    Diabetes Maternal Grandmother        type 2   Heart attack Maternal Grandmother    Arthritis Maternal Grandfather    Heart disease Maternal Grandfather    Hypertension Maternal Grandfather    Heart attack Maternal Grandfather    Arthritis Mother    Hyperlipidemia  Mother    Hypertension Mother    Diabetes Mother        type 2   Hyperlipidemia Father    Arthritis Paternal Grandmother    Arthritis Paternal Grandfather    Heart attack Paternal Aunt    Social History   Socioeconomic History   Marital status: Married    Spouse name: Not on file   Number of children: 2   Years of education: Not on file   Highest education level: Not on file  Occupational History   Occupation: Education officer, environmental, [employer]  Tobacco Use   Smoking status: Never   Smokeless tobacco: Never  Vaping Use   Vaping status: Never Used  Substance and Sexual Activity   Alcohol use: No   Drug use: No   Sexual activity: Not on file  Other Topics Concern   Not on file  Social History Narrative   Not on file   Social Drivers of Health   Financial Resource Strain: Not on file  Food Insecurity: Not on file  Transportation Needs: Not on file  Physical Activity: Not on file  Stress: Not on file  Social Connections: Not on file     Review of Systems     Objective:     There were no vitals taken for this visit. Wt Readings from Last 3 Encounters:  02/28/24 190 lb (86.2 kg)  02/15/24 195 lb (88.5 kg)  09/30/23 193  lb 3.2 oz (87.6 kg)    Physical Exam  {Perform Simple Foot Exam  Perform Detailed exam:1} {Insert foot Exam (Optional):30965}   Outpatient Encounter Medications as of 03/29/2024  Medication Sig   amitriptyline  (ELAVIL ) 50 MG tablet TAKE 1 TABLET BY MOUTH DAILY   atorvastatin  (LIPITOR) 20 MG tablet TAKE 1 TABLET BY MOUTH DAILY   Blood Glucose Monitoring Suppl (ONETOUCH VERIO FLEX SYSTEM) w/Device KIT See admin instructions.   Cholecalciferol (VITAMIN D3 PO) Take by mouth.   clotrimazole -betamethasone  (LOTRISONE ) cream Apply 1 Application topically 2 (two) times daily.   Continuous Glucose Sensor (DEXCOM G6 SENSOR) MISC APPLY 1 SENSOR EVERY 10 DAYS   Continuous Glucose Transmitter (DEXCOM G6 TRANSMITTER) MISC USE FOR 3 MONTHS   CONTOUR  NEXT TEST test strip    cyproheptadine (PERIACTIN) 4 MG tablet Take 4 mg by mouth 2 (two) times daily.   folic acid (FOLVITE) 800 MCG tablet Take by mouth.   gabapentin  (NEURONTIN ) 300 MG capsule TAKE 1 CAPSULE TWICE DAILY   insulin  aspart (NOVOLOG ) 100 UNIT/ML injection Inject 160 Units into the skin daily. Inject up to 1.6 mLs (160 Units total) into the skin as directed.   levothyroxine  (SYNTHROID ) 112 MCG tablet TAKE 1 TABLET BY MOUTH DAILY   omeprazole  (PRILOSEC) 20 MG capsule TAKE 1 CAPSULE BY MOUTH EVERY DAY   predniSONE  (DELTASONE ) 10 MG tablet Take 6 tablets x 1 day and then decrease by 1/2 tablet per day until down to zero mg.   Probiotic Product (PROBIOTIC PO) Take by mouth.   topiramate  (TOPAMAX ) 25 MG tablet TAKE 1 TO 2 TABLETS BY MOUTH EVERY DAY   triamcinolone  cream (KENALOG ) 0.1 % Apply 1 Application topically 2 (two) times daily.   No facility-administered encounter medications on file as of 03/29/2024.     Lab Results  Component Value Date   WBC 10.5 02/10/2023   HGB 14.4 02/10/2023   HCT 42.8 02/10/2023   PLT 307.0 02/10/2023   GLUCOSE 202 (H) 02/10/2023   CHOL 148 02/10/2023   TRIG 164.0 (H) 02/10/2023   HDL 32.40 (L) 02/10/2023   LDLCALC 83 02/10/2023   ALT 24 03/11/2023   AST 17 03/11/2023   NA 138 02/10/2023   K 4.4 02/10/2023   CL 106 02/10/2023   CREATININE 1.18 02/10/2023   BUN 12 02/10/2023   CO2 25 02/10/2023   TSH 1.02 02/10/2023   PSA 0.91 02/10/2023   HGBA1C 6.6 (A) 05/12/2023    No results found.     Assessment & Plan:  There are no diagnoses linked to this encounter.   Allena Hamilton, MD

## 2024-03-30 ENCOUNTER — Encounter: Payer: Self-pay | Admitting: Internal Medicine

## 2024-03-30 ENCOUNTER — Ambulatory Visit: Admitting: Internal Medicine

## 2024-03-30 VITALS — BP 122/60 | HR 88 | Ht 67.0 in | Wt 193.6 lb

## 2024-03-30 DIAGNOSIS — E039 Hypothyroidism, unspecified: Secondary | ICD-10-CM | POA: Diagnosis not present

## 2024-03-30 DIAGNOSIS — E782 Mixed hyperlipidemia: Secondary | ICD-10-CM | POA: Diagnosis not present

## 2024-03-30 DIAGNOSIS — E1042 Type 1 diabetes mellitus with diabetic polyneuropathy: Secondary | ICD-10-CM

## 2024-03-30 LAB — POCT GLYCOSYLATED HEMOGLOBIN (HGB A1C): Hemoglobin A1C: 6.6 % — AB (ref 4.0–5.6)

## 2024-03-30 NOTE — Patient Instructions (Addendum)
 Please use the following pump settings: - basal rates: 12 am: 2.0 units/h 3 am: 2.1 >> 2.0 6 am: 1.6 4 pm: 1.3 6 pm: 1.3 9 pm: 1.8 - ICR:  12 am: 1:10 >> 1:8 6 am: 1:8 11:30 am: 1:6 1 pm: 1:10 11 pm: 1:10 >> 1:8 - Target: 110 - ISF: 40 - Active Insulin  Time: 5 h - bolus wizard: on  START boluses 15 min before each meal!  If you do not enter all the carbs at the beginning of the meal, just do a correction when the postprandial sugars are high.  Try the activity mode when you exercise.  Please continue Levothyroxine  112 mcg daily.  Take the thyroid  hormone every day, with water, at least 30 minutes before breakfast, separated by at least 4 hours from: - acid reflux medications - calcium  - iron - multivitamins  Please return in 3-4 months.

## 2024-03-30 NOTE — Addendum Note (Signed)
 Addended by: CLEOTILDE ROLIN RAMAN on: 03/30/2024 01:56 PM   Modules accepted: Orders

## 2024-03-30 NOTE — Progress Notes (Signed)
 Patient ID: Eric Hayden, male   DOB: [birth year], 48 y.o.   MRN: [id number]  HPI: Eric Hayden is a 48 y.o.-year-old pleasant male, initially referred by his PCP, Dr. Glendia, returning for follow-up for DM1, diagnosed at 49 months old -reportedly undetectable C-peptide, fairly well controlled, with complications (peripheral neuropathy, h/o diabetic retinopathy s/p distant laser therapy). He previously saw endocrinology in [facility] (Dr. Almarie Lesches), last office visit in 05/2021.  He had to stop seeing her due to change in insurance.  Last visit with me 11 months ago.  Interim history: No increased urination, blurry vision, chest pain. He continues to have migraines (also vertigo) daily  - started approximately 5 years ago and have been resistant to all treatments.  He started to get Botox injections and he felt an improvement, but then insurance stopped covering them.  He is seeing neurology and now going to the [facility].  Now on Eptinezumab-jjmr Injection.  He had to cancel his previous 2 appointments with me due to headaches. He just finished a steroid 2-week taper last night.  Reviewed HbA1c levels: Lab Results  Component Value Date   HGBA1C 6.6 (A) 05/12/2023   HGBA1C 6.5 (A) 11/08/2022   HGBA1C 6.5 (A) 07/06/2022    Insulin  pump:  - started 1999 - Medtronic 670 - out of warranty 04/2021 - but with [facility] technology Per endo note from 05/2021: Currently using MiniMed pump with CGM, and warranty just ran out.  Has had [employer] for awhile, and they have approved his pump supplies via [employer] UNTIL this summer when [employer] stopped approving reservoirs (but they have continued to send everything EXCEPT the reservoirs). He received a letter saying that they cannot send reservoirs b/c they had not received information that he had failed MDIs (when he has been on pump > 28yrs).  - now T:slim-started Spring 2024.  CGM: - Medtronic >> Dexcom  Insulin : -  NovoLog  >> Fiasp  - sugars are better controlled on this/(Humalog  if cannot get FiAsp ) - Humalog  >> Lispro now  Supplies: - [employer] - having pbs with them...  Pump settings: - basal rates: 12 am: 2.0 units/h 3 am: 2.1 6 am: 1.6 4 pm: 1.3 6 pm: 1.3 9 pm: 1.8 - ICR:  12 am: 1:10 6 am: 1:8 11:30 am: 1:6 1 pm: 1:10 - Target: 110 - ISF: 40 - Active Insulin  Time: 5 h - changes infusion site: q2 days TDD from basal insulin : 35% (41 units) >> 43% (41 units) >> 43% >> 41% TDD from bolus insulin : 65% (75 units) >> 57% (55 units) >> 57% >> 59% Total daily dose: 115-140 >> 114-140 >> 123-150 units Average amount of carbs entered per day: 737 g >> 512 g >> 602 g  Meter: One Touch Verio >> Contour Link >> AccuChek guide as insurance stopped covering the controlling which connected to his pump  Pt checks his sugars >4 a day with his CGM:   Previously:    Lowest sugar was   LO >> 39 (active) >> 40; he has hypoglycemia awareness at 60. Hyperglycemia awareness at 240-250.   He has a glucagon kit at home. No previous hypoglycemia admission.  He has Baqsimi at home. Highest sugar was 300s >> 300s. No previous DKA admissions.    Pt's meals are: - Breakfast: Cereal, juice, fruit - Lunch: salad, tea, sandwich - Dinner: Pasta, casserole, steak or chicken, vegetables, rice/potatoes, milk - Snacks: 3-4 He plays tennis for exercise.  - no CKD, last  BUN/creatinine:  Lab Results  Component Value Date   BUN 12 02/10/2023   BUN 16 11/04/2021   CREATININE 1.18 02/10/2023   CREATININE 1.07 11/04/2021   No results found for: MICRALBCREAT No ACEI/ ARBs.  - + HL;  last set of lipids: Lab Results  Component Value Date   CHOL 148 02/10/2023   HDL 32.40 (L) 02/10/2023   LDLCALC 83 02/10/2023   TRIG 164.0 (H) 02/10/2023   CHOLHDL 5 02/10/2023  He is on Lipitor 20 mg daily.  - last eye exam was 10/18/2023. + mild NP DR OU, without macular edema.  [facility].  - + pain,  numbness, and tingling in his feet.  On amitriptyline  25 mg daily at bedtime and gabapentin  600 mg 2x a day - per neurology.  Last foot exam 02/10/2023. He was on Lyrica  before >> not covered anymore.   He has family history of DM2 in mother, MGM, etc. DM1 in maternal uncle.  Hypothyroidism: - dx'ed in ~2009  Pt is on levothyroxine  112 mcg daily (last dose decrease: 07/2022), taken: - in am - fasting - at least 30 min from b'fast - no calcium  - no iron - no multivitamins - no PPIs - not on Biotin  Lab Results  Component Value Date   TSH 1.02 02/10/2023   TSH 0.55 11/08/2022   TSH 0.12 (L) 07/06/2022   TSH 0.27 (L) 03/01/2022   TSH 0.14 (L) 11/04/2021   TSH 4.650 (H) 09/04/2021   TSH 15.01 (H) 07/07/2021   TSH 2.01 05/04/2021   TSH 0.05 (L) 03/11/2021   TSH 3.50 09/05/2019   TSH 1.40 05/17/2017   TSH 0.73 02/10/2016   TSH 0.44 07/25/2015   TSH 0.59 12/20/2014   TSH 5.11 04/06/2014   + FH in M and F. No FH of thyroid  cancer. No h/o radiation tx to head or neck. No herbal supplements. No Biotin use. No recent steroids use.   Patient also has a history of migraine without aura - on Topamax , GERD.  Also, per review of endocrinology note from 05/2021: 1. T1DM dx at 84 months old, reportedly undetectable cpeptide with prior study:  Hx minor background retinopathy w distant hx of laser tx. No recent DR reported, 11/2016.  No microalbuminuria, 08/2018   No sig peripheral neuropathy, normal monofilament 05/2021  hx of L shoulder superficial chest pain attributed to neuropathy managed on lyrica  & elavil  (started by [facility] pain clinic, was also evaluated by [facility], Neurology at the time), subsequently changed to neurontin  and elavil .   Hx of gastroparesis sx (did not have gastric emptying study) which previously responded to reglan x 10 days, has not required since. 2. Hypothyroidism 3. OSA w cpap 4. Asthma 5. HTN 6. HLD 7. Hx intermittent L shoulder chest pain s/p several  normal stress tests, including nuclear 2014 time frame --> attributed to neuropathy (see above) 8. Vestibular migraines w intermittent vertigo previously tx w trokendi  9. Adhesive capsulitis 05/2019 10. Celiac disease dx 2021  ROS: + see HPI  Past Medical History:  Diagnosis Date   Asthma    Cancer (HCC)    appendiceal   Chicken pox    GERD (gastroesophageal reflux disease)    Heart murmur    Hx of oral aphthous ulcers    Hypothyroidism    Migraines    Sleep apnea    Type 1 diabetes mellitus (HCC)    Past Surgical History:  Procedure Laterality Date   APPENDECTOMY  2012   COLONOSCOPY  WITH PROPOFOL  N/A 10/15/2019   Procedure: COLONOSCOPY WITH PROPOFOL ;  Surgeon: Therisa Bi, MD;  Location: [facility] ENDOSCOPY;  Service: Gastroenterology;  Laterality: N/A;   ESOPHAGOGASTRODUODENOSCOPY (EGD) WITH PROPOFOL  N/A 10/15/2019   Procedure: ESOPHAGOGASTRODUODENOSCOPY (EGD) WITH PROPOFOL ;  Surgeon: Therisa Bi, MD;  Location: [facility] ENDOSCOPY;  Service: Gastroenterology;  Laterality: N/A;   TONSILLECTOMY AND ADENOIDECTOMY  2007   UPPER GI ENDOSCOPY     Social History   Socioeconomic History   Marital status: Married    Spouse name: Not on file   Number of children: 2   Years of education: Not on file   Highest education level: Not on file  Occupational History   Occupation: Education officer, environmental, [employer]  Tobacco Use   Smoking status: Never   Smokeless tobacco: Never  Vaping Use   Vaping status: Never Used  Substance and Sexual Activity   Alcohol use: No   Drug use: No   Sexual activity: Not on file  Other Topics Concern   Not on file  Social History Narrative   Not on file   Social Drivers of Health   Financial Resource Strain: Not on file  Food Insecurity: Not on file  Transportation Needs: Not on file  Physical Activity: Not on file  Stress: Not on file  Social Connections: Not on file  Intimate Partner Violence: Not on file   Current Outpatient Medications on  File Prior to Visit  Medication Sig Dispense Refill   amitriptyline  (ELAVIL ) 50 MG tablet TAKE 1 TABLET BY MOUTH DAILY 30 tablet 1   atorvastatin  (LIPITOR) 20 MG tablet TAKE 1 TABLET BY MOUTH DAILY 90 tablet 3   Blood Glucose Monitoring Suppl (ONETOUCH VERIO FLEX SYSTEM) w/Device KIT See admin instructions.     Cholecalciferol (VITAMIN D3 PO) Take by mouth.     clotrimazole -betamethasone  (LOTRISONE ) cream Apply 1 Application topically 2 (two) times daily. 30 g 0   Continuous Glucose Sensor (DEXCOM G6 SENSOR) MISC APPLY 1 SENSOR EVERY 10 DAYS 9 each 3   Continuous Glucose Transmitter (DEXCOM G6 TRANSMITTER) MISC USE FOR 3 MONTHS 1 each 3   CONTOUR NEXT TEST test strip   1   cyproheptadine (PERIACTIN) 4 MG tablet Take 4 mg by mouth 2 (two) times daily.     folic acid (FOLVITE) 800 MCG tablet Take by mouth.     gabapentin  (NEURONTIN ) 300 MG capsule TAKE 1 CAPSULE TWICE DAILY 180 capsule 1   insulin  aspart (NOVOLOG ) 100 UNIT/ML injection Inject 160 Units into the skin daily. Inject up to 1.6 mLs (160 Units total) into the skin as directed. 150 mL 3   levothyroxine  (SYNTHROID ) 112 MCG tablet TAKE 1 TABLET BY MOUTH DAILY 45 tablet 1   omeprazole  (PRILOSEC) 20 MG capsule TAKE 1 CAPSULE BY MOUTH EVERY DAY 90 capsule 0   predniSONE  (DELTASONE ) 10 MG tablet Take 6 tablets x 1 day and then decrease by 1/2 tablet per day until down to zero mg. 39 tablet 0   Probiotic Product (PROBIOTIC PO) Take by mouth.     topiramate  (TOPAMAX ) 25 MG tablet TAKE 1 TO 2 TABLETS BY MOUTH EVERY DAY 60 tablet 1   triamcinolone  cream (KENALOG ) 0.1 % Apply 1 Application topically 2 (two) times daily. 30 g 0   No current facility-administered medications on file prior to visit.   Allergies  Allergen Reactions   Augmentin [Amoxicillin-Pot Clavulanate] Nausea And Vomiting   Levaquin [Levofloxacin In D5w] Swelling    Blurred vision   Family History  Problem Relation Age of Onset   Arthritis Maternal Grandmother     Hyperlipidemia Maternal Grandmother    Heart disease Maternal Grandmother    Hypertension Maternal Grandmother    Diabetes Maternal Grandmother        type 2   Heart attack Maternal Grandmother    Arthritis Maternal Grandfather    Heart disease Maternal Grandfather    Hypertension Maternal Grandfather    Heart attack Maternal Grandfather    Arthritis Mother    Hyperlipidemia Mother    Hypertension Mother    Diabetes Mother        type 2   Hyperlipidemia Father    Arthritis Paternal Grandmother    Arthritis Paternal Grandfather    Heart attack Paternal Aunt    PE: BP 122/60   Pulse 88   Ht 5' 7 (1.702 m)   Wt 193 lb 9.6 oz (87.8 kg)   SpO2 97%   BMI 30.32 kg/m  Wt Readings from Last 3 Encounters:  03/30/24 193 lb 9.6 oz (87.8 kg)  02/28/24 190 lb (86.2 kg)  02/15/24 195 lb (88.5 kg)   Constitutional: overweight, in NAD Eyes: no exophthalmos ENT: no masses palpated in neck, no cervical lymphadenopathy Cardiovascular: RRR, No MRG Respiratory: CTA B Musculoskeletal: no deformities Skin: no rashes Neurological: no tremor with outstretched hands Diabetic Foot Exam - Simple   Simple Foot Form Diabetic Foot exam was performed with the following findings: Yes 03/30/2024  1:43 PM  Visual Inspection No deformities, no ulcerations, no other skin breakdown bilaterally: Yes Sensation Testing Intact to touch and monofilament testing bilaterally: Yes Pulse Check Posterior Tibialis and Dorsalis pulse intact bilaterally: Yes Comments    ASSESSMENT: 1. DM1, fairly well controlled, with complications - Peripheral neuropathy - h/o DR -resolved after laser surgery  2. HL  3. Hypothyroidism  PLAN:  1. Patient with longstanding, previously fairly well-controlled type 1 diabetes, on the t:slim X2 insulin  pump integrated with the Dexcom CGM.  He returns after long absence of almost a year.  In the past, he was not entering all of his carbs into the pump at the beginning of the  meal and we discussed about trying his best to do so.  To avoid low blood sugars later after a meal, I advised him that if he forgot to bolus before the meal, to only do a correction after meal, but not enter all of the carbs that he actually ate. At last visit, sugars are mostly fluctuating within the target range, but they were more variable, reportedly due to upper respiratory congestion in the previous 3 months.  He was reporting going to bed late and eating another meal around 12 AM and not bolusing for this meal.  He was bolusing for this when the sugars were really high after the meal.  I again advised him to try his best to bolus before the meal and to start all of the boluses 15 minutes before each meal.  We discussed that until he starts doing so, we will not be able to adequately control his diabetes. CGM interpretation: -At today's visit, we reviewed his CGM downloads: It appears that 60% of values are in target range (goal >70%), while 35% are higher than 180 (goal <25%), and 5% are lower than 70 (goal <4%).  The calculated average blood sugar is 163.  The projected HbA1c for the next 3 months (GMI) is 7.2%. -Reviewing the CGM trends, sugars are mostly fluctuating within the target range but with hyperglycemic  exceptions after lunch and with significantly increased blood sugars after his 12 AM meal.  He also has low blood sugars likely due to bolusing too late for the meals, with a more drastic blood sugars in the second half of the day and first half of the night. -He mentions that he has been on prednisone  for the last 2 weeks, doing a taper, which he just finished.  Sugars are higher in the last 2 weeks, particularly after his 12 AM meal.  He sometimes misses the bolus before this meal, but in the last 2 weeks, even when he was bolusing for it, sugars were still increasing, which I believe may be the effect of prednisone .  In the last 3 days, when he was taking a lower dose of prednisone , the  postprandial excursions were not as pronounced.  I still feel that he needs a stronger insulin  to carb ratio and I advised him to change this from 11 PM to 6 AM.  Since he is occasionally dropping his blood sugars too low after an initial increase after his midnight meal, we discussed about decreasing the basal rate from 3 AM to 6 AM slightly. 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  I advised him to try the activity mode. - I suggested to: Patient Instructions  Please use the following pump settings: - basal rates: 12 am: 2.0 units/h 3 am: 2.1 >> 2.0 6 am: 1.6 4 pm: 1.3 6 pm: 1.3 9 pm: 1.8 - ICR:  12 am: 1:10 >> 1:8 6 am: 1:8 11:30 am: 1:6 1 pm: 1:10 11 pm: 1:10 >> 1:8 - Target: 110 - ISF: 40 - Active Insulin  Time: 5 h - bolus wizard: on  START boluses 15 min before each meal!  If you do not enter all the carbs at the beginning of the meal, just do a correction when the postprandial sugars are high.  Try the activity mode when you exercise.  Please continue Levothyroxine  112 mcg daily.  Take the thyroid  hormone every day, with water, at least 30 minutes before breakfast, separated by at least 4 hours from: - acid reflux medications - calcium  - iron - multivitamins  Please return in 3-4 months.  - we checked his HbA1c: 6.6% (stable) - advised to check sugars at different times of the day - 4x a day, rotating check times - advised for yearly eye exams >> he is UTD - he has peripheral neuropathy and his previous endocrinologist was refilling his amitriptyline  and Neurontin .  However, he is seeing neurology and I suggested to have them refill these, if they agree. - will check a urine ACR today - return to clinic in 3-4 months  2.  Hyperlipidemia - Latest lipid panel showed an LDL higher than our goal of less than 70, HDL slightly  low: Lab Results  Component Value Date   CHOL 148 02/10/2023   HDL 32.40 (L) 02/10/2023   LDLCALC 83 02/10/2023   TRIG 164.0 (H) 02/10/2023   CHOLHDL 5 02/10/2023  -He continues on Lipitor 40 mg daily without side effects -he plans to have this checked by PCP at his next APE  3. Hypothyroidism - latest thyroid  labs reviewed with pt. >> normal: Lab Results  Component Value Date   TSH 1.02 02/10/2023  - he continues on LT4 112 mcg daily - pt feels good on  this dose. - we discussed about taking the thyroid  hormone every day, with water, >30 minutes before breakfast, separated by >4 hours from acid reflux medications, calcium , iron, multivitamins. Pt. is taking it correctly. - We are not able to check a TSH since he just completed a steroid course - he plans to schedule an annual physical exam with PCP soon and would like to have a TSH checked at that time.  Lela Fendt, MD PhD [facility]

## 2024-03-31 ENCOUNTER — Ambulatory Visit: Payer: Self-pay | Admitting: Internal Medicine

## 2024-03-31 LAB — MICROALBUMIN / CREATININE URINE RATIO
Creatinine, Urine: 133 mg/dL (ref 20–320)
Microalb Creat Ratio: 2 mg/g{creat} (ref <30)
Microalb, Ur: 0.3 mg/dL

## 2024-04-06 DIAGNOSIS — M5412 Radiculopathy, cervical region: Secondary | ICD-10-CM | POA: Diagnosis not present

## 2024-04-08 ENCOUNTER — Encounter: Payer: Self-pay | Admitting: Internal Medicine

## 2024-04-08 NOTE — Progress Notes (Signed)
 Patient ID: Eric Hayden, male   DOB: Aug 23, 1975, 48 y.o.   MRN: [id number] Did not show for appt.

## 2024-04-09 DIAGNOSIS — M5412 Radiculopathy, cervical region: Secondary | ICD-10-CM | POA: Diagnosis not present

## 2024-04-10 DIAGNOSIS — F4323 Adjustment disorder with mixed anxiety and depressed mood: Secondary | ICD-10-CM | POA: Diagnosis not present

## 2024-04-11 DIAGNOSIS — M5412 Radiculopathy, cervical region: Secondary | ICD-10-CM | POA: Diagnosis not present

## 2024-04-13 DIAGNOSIS — M5010 Cervical disc disorder with radiculopathy, unspecified cervical region: Secondary | ICD-10-CM | POA: Diagnosis not present

## 2024-04-13 DIAGNOSIS — M542 Cervicalgia: Secondary | ICD-10-CM | POA: Diagnosis not present

## 2024-04-13 DIAGNOSIS — M5412 Radiculopathy, cervical region: Secondary | ICD-10-CM | POA: Diagnosis not present

## 2024-04-16 DIAGNOSIS — F4323 Adjustment disorder with mixed anxiety and depressed mood: Secondary | ICD-10-CM | POA: Diagnosis not present

## 2024-04-24 DIAGNOSIS — M5412 Radiculopathy, cervical region: Secondary | ICD-10-CM | POA: Diagnosis not present

## 2024-04-27 DIAGNOSIS — M5412 Radiculopathy, cervical region: Secondary | ICD-10-CM | POA: Diagnosis not present

## 2024-05-01 DIAGNOSIS — H6123 Impacted cerumen, bilateral: Secondary | ICD-10-CM | POA: Diagnosis not present

## 2024-05-01 DIAGNOSIS — H902 Conductive hearing loss, unspecified: Secondary | ICD-10-CM | POA: Diagnosis not present

## 2024-05-07 ENCOUNTER — Other Ambulatory Visit: Payer: Self-pay | Admitting: Internal Medicine

## 2024-05-15 DIAGNOSIS — F4323 Adjustment disorder with mixed anxiety and depressed mood: Secondary | ICD-10-CM | POA: Diagnosis not present

## 2024-05-23 DIAGNOSIS — E1010 Type 1 diabetes mellitus with ketoacidosis without coma: Secondary | ICD-10-CM | POA: Diagnosis not present

## 2024-05-23 DIAGNOSIS — Z794 Long term (current) use of insulin: Secondary | ICD-10-CM | POA: Diagnosis not present

## 2024-05-23 DIAGNOSIS — Z9641 Presence of insulin pump (external) (internal): Secondary | ICD-10-CM | POA: Diagnosis not present

## 2024-05-23 DIAGNOSIS — E1042 Type 1 diabetes mellitus with diabetic polyneuropathy: Secondary | ICD-10-CM | POA: Diagnosis not present

## 2024-05-29 DIAGNOSIS — F4323 Adjustment disorder with mixed anxiety and depressed mood: Secondary | ICD-10-CM | POA: Diagnosis not present

## 2024-05-30 DIAGNOSIS — M5412 Radiculopathy, cervical region: Secondary | ICD-10-CM | POA: Diagnosis not present

## 2024-06-04 ENCOUNTER — Other Ambulatory Visit: Payer: Self-pay | Admitting: Internal Medicine

## 2024-06-11 ENCOUNTER — Encounter: Payer: Self-pay | Admitting: Pharmacist

## 2024-06-11 NOTE — Progress Notes (Signed)
 Pharmacy Quality Measure Review  This patient is appearing on a report for being at risk of failing the Kidney Health Evaluation for Patients with Diabetes measure this calendar year.   Last documented UACR 2020 At [facility]. 5 mg/g  Due for routine UACR screening in the setting of Diabetes.   Future Appointments  Date Time Provider Department Center  07/31/2024  1:00 PM Trixie File, MD [facility] None  12/25/2024 12:00 PM Hester Alm BROCKS, MD [facility] None   Message sent to Endo provider to consider UACR at upcoming visit scheduled 07/31/24.

## 2024-06-15 DIAGNOSIS — M5412 Radiculopathy, cervical region: Secondary | ICD-10-CM | POA: Diagnosis not present

## 2024-06-23 ENCOUNTER — Encounter: Payer: Self-pay | Admitting: Internal Medicine

## 2024-06-23 DIAGNOSIS — M5412 Radiculopathy, cervical region: Secondary | ICD-10-CM | POA: Insufficient documentation

## 2024-06-24 ENCOUNTER — Other Ambulatory Visit: Payer: Self-pay | Admitting: Internal Medicine

## 2024-06-25 DIAGNOSIS — G43809 Other migraine, not intractable, without status migrainosus: Secondary | ICD-10-CM | POA: Diagnosis not present

## 2024-06-25 DIAGNOSIS — G43E11 Chronic migraine with aura, intractable, with status migrainosus: Secondary | ICD-10-CM | POA: Diagnosis not present

## 2024-07-01 ENCOUNTER — Other Ambulatory Visit: Payer: Self-pay | Admitting: Internal Medicine

## 2024-07-03 DIAGNOSIS — H60333 Swimmer's ear, bilateral: Secondary | ICD-10-CM | POA: Diagnosis not present

## 2024-07-03 DIAGNOSIS — H6123 Impacted cerumen, bilateral: Secondary | ICD-10-CM | POA: Diagnosis not present

## 2024-07-06 ENCOUNTER — Encounter: Payer: Self-pay | Admitting: Internal Medicine

## 2024-07-06 ENCOUNTER — Ambulatory Visit: Admitting: Internal Medicine

## 2024-07-06 VITALS — BP 132/82 | HR 82 | Temp 97.9°F | Ht 67.0 in | Wt 199.0 lb

## 2024-07-06 DIAGNOSIS — K219 Gastro-esophageal reflux disease without esophagitis: Secondary | ICD-10-CM | POA: Diagnosis not present

## 2024-07-06 DIAGNOSIS — E1042 Type 1 diabetes mellitus with diabetic polyneuropathy: Secondary | ICD-10-CM

## 2024-07-06 DIAGNOSIS — Z0000 Encounter for general adult medical examination without abnormal findings: Secondary | ICD-10-CM | POA: Diagnosis not present

## 2024-07-06 DIAGNOSIS — E1040 Type 1 diabetes mellitus with diabetic neuropathy, unspecified: Secondary | ICD-10-CM | POA: Diagnosis not present

## 2024-07-06 DIAGNOSIS — E039 Hypothyroidism, unspecified: Secondary | ICD-10-CM

## 2024-07-06 DIAGNOSIS — R519 Headache, unspecified: Secondary | ICD-10-CM

## 2024-07-06 DIAGNOSIS — Z125 Encounter for screening for malignant neoplasm of prostate: Secondary | ICD-10-CM

## 2024-07-06 DIAGNOSIS — G4733 Obstructive sleep apnea (adult) (pediatric): Secondary | ICD-10-CM

## 2024-07-06 DIAGNOSIS — R42 Dizziness and giddiness: Secondary | ICD-10-CM | POA: Diagnosis not present

## 2024-07-06 DIAGNOSIS — E7800 Pure hypercholesterolemia, unspecified: Secondary | ICD-10-CM

## 2024-07-06 DIAGNOSIS — G629 Polyneuropathy, unspecified: Secondary | ICD-10-CM

## 2024-07-06 NOTE — Progress Notes (Unsigned)
 Subjective:    Patient ID: Eric Hayden, male    DOB: [birth year], 48 y.o.   MRN: [id number]  Patient here for  Chief Complaint  Patient presents with   Annual Exam    HPI Here for a physical exam. Had f/u with neurology 06/25/24 - follow up headache and vertigo. Headaches occur on a daily basis. Also severe vertigo on a regular basis. Recommended to continue with amitriptyline  and verapamil . He regularly takes gabapentin  and amitriptyline . Takes naratriptan and zofran  as needed. The patient uses a Nerivio device preventatively every other day for approximately 4 months without significant benefit. He has previously tried multiple prescribed injectable medications and oral medications including Aimovig, Ajovy , Emgality. After failing injectables, he received 2 rounds of Botox injections at the end of 2023 without benefit. Past evaluation includes an MRI 6 or 7 years ago that was unremarkable and a repeat MRI 2 years ago that was unchanged. He was previously evaluated by ear, nose, and throat specialists; endocrinology; and cardiology with no abnormalities and no etiology for his headaches determined. He is using cpap regularly. He has had numerous trials of preventive medications, including topiramate , Trokendi , amitriptyline , gabapentin , Ajovy , Qulipta, zonisamide, Emgality, low-dose Depakote, cyproheptadine, candesartan, and botulinum toxin injections x2. He has also used Cefaly and Nerivio devices. Saw ortho - c7 radiculopathy - severe foraminal stenosis at C6-C7 on the right. Had f/u with endocrinology 03/30/24 - f/u diabetes. Sugars have been doing well. Breathing stable. Increased stress with above medical issues. Seeing a veterinary surgeon. Will need to change neurologist and his therapist due to insurance change.    Past Medical History:  Diagnosis Date   Allergy    Celiac Disease   Asthma    Cancer (HCC)    appendiceal   Chicken pox    GERD (gastroesophageal reflux disease)    Heart murmur     Hx of oral aphthous ulcers    Hypothyroidism    Migraines    Sleep apnea    Type 1 diabetes mellitus (HCC)    Past Surgical History:  Procedure Laterality Date   APPENDECTOMY  2012   COLONOSCOPY WITH PROPOFOL  N/A 10/15/2019   Procedure: COLONOSCOPY WITH PROPOFOL ;  Surgeon: Therisa Bi, MD;  Location: [facility] ENDOSCOPY;  Service: Gastroenterology;  Laterality: N/A;   ESOPHAGOGASTRODUODENOSCOPY (EGD) WITH PROPOFOL  N/A 10/15/2019   Procedure: ESOPHAGOGASTRODUODENOSCOPY (EGD) WITH PROPOFOL ;  Surgeon: Therisa Bi, MD;  Location: [facility] ENDOSCOPY;  Service: Gastroenterology;  Laterality: N/A;   TONSILLECTOMY AND ADENOIDECTOMY  2007   UPPER GI ENDOSCOPY     Family History  Problem Relation Age of Onset   Arthritis Maternal Grandmother    Hyperlipidemia Maternal Grandmother    Heart disease Maternal Grandmother    Hypertension Maternal Grandmother    Diabetes Maternal Grandmother        type 2   Heart attack Maternal Grandmother    Cancer Maternal Grandmother    Arthritis Maternal Grandfather    Heart disease Maternal Grandfather    Hypertension Maternal Grandfather    Heart attack Maternal Grandfather    Cancer Maternal Grandfather    Arthritis Mother    Hyperlipidemia Mother    Hypertension Mother    Diabetes Mother        type 2   Hyperlipidemia Father    Cancer Father    Arthritis Paternal Grandmother    Stroke Paternal Grandmother    Arthritis Paternal Grandfather    Cancer Paternal Grandfather    Heart attack Paternal Aunt  Social History   Socioeconomic History   Marital status: Married    Spouse name: Not on file   Number of children: 2   Years of education: Not on file   Highest education level: Not on file  Occupational History   Occupation: Education Officer, Environmental, [employer]  Tobacco Use   Smoking status: Never   Smokeless tobacco: Never  Vaping Use   Vaping status: Never Used  Substance and Sexual Activity   Alcohol use: No   Drug use: No   Sexual  activity: Not on file  Other Topics Concern   Not on file  Social History Narrative   Not on file   Social Drivers of Health   Financial Resource Strain: Not on file  Food Insecurity: Not on file  Transportation Needs: Not on file  Physical Activity: Not on file  Stress: Not on file  Social Connections: Not on file     Review of Systems  Constitutional:  Negative for appetite change and unexpected weight change.  HENT:  Negative for congestion and sinus pressure.   Respiratory:  Negative for cough, chest tightness and shortness of breath.   Cardiovascular:  Negative for chest pain, palpitations and leg swelling.  Gastrointestinal:  Negative for abdominal pain, diarrhea, nausea and vomiting.  Genitourinary:  Negative for difficulty urinating and dysuria.  Musculoskeletal:  Negative for joint swelling and myalgias.  Skin:  Negative for color change and rash.  Neurological:  Positive for dizziness, light-headedness and headaches.  Psychiatric/Behavioral:         Increased stress as outlined.        Objective:     BP 132/82   Pulse 82   Temp 97.9 F (36.6 C) (Oral)   Ht 5' 7 (1.702 m)   Wt 199 lb (90.3 kg)   SpO2 98%   BMI 31.17 kg/m  Wt Readings from Last 3 Encounters:  07/06/24 199 lb (90.3 kg)  03/30/24 193 lb 9.6 oz (87.8 kg)  02/28/24 190 lb (86.2 kg)    Physical Exam Vitals reviewed.  Constitutional:      General: He is not in acute distress.    Appearance: Normal appearance. He is well-developed.  HENT:     Head: Normocephalic and atraumatic.     Right Ear: External ear normal.     Left Ear: External ear normal.     Mouth/Throat:     Pharynx: No oropharyngeal exudate or posterior oropharyngeal erythema.  Eyes:     General: No scleral icterus.       Right eye: No discharge.        Left eye: No discharge.     Conjunctiva/sclera: Conjunctivae normal.  Cardiovascular:     Rate and Rhythm: Normal rate and regular rhythm.  Pulmonary:     Effort:  Pulmonary effort is normal. No respiratory distress.     Breath sounds: Normal breath sounds.  Abdominal:     General: Bowel sounds are normal.     Palpations: Abdomen is soft.     Tenderness: There is no abdominal tenderness.  Musculoskeletal:        General: No swelling or tenderness.     Cervical back: Neck supple. No tenderness.  Lymphadenopathy:     Cervical: No cervical adenopathy.  Skin:    Findings: No erythema or rash.  Neurological:     Mental Status: He is alert.  Psychiatric:        Mood and Affect: Mood normal.  Behavior: Behavior normal.         Outpatient Encounter Medications as of 07/06/2024  Medication Sig   Blood Glucose Monitoring Suppl (ONETOUCH VERIO FLEX SYSTEM) w/Device KIT See admin instructions.   celecoxib (CELEBREX) 200 MG capsule Take 200 mg by mouth daily.   Cholecalciferol (VITAMIN D3 PO) Take by mouth.   clotrimazole -betamethasone  (LOTRISONE ) cream Apply 1 Application topically 2 (two) times daily.   Continuous Glucose Sensor (DEXCOM G6 SENSOR) MISC APPLY 1 SENSOR EVERY 10 DAYS   Continuous Glucose Transmitter (DEXCOM G6 TRANSMITTER) MISC USE FOR 3 MONTHS   CONTOUR NEXT TEST test strip    cyproheptadine (PERIACTIN) 4 MG tablet Take 4 mg by mouth 2 (two) times daily.   folic acid (FOLVITE) 800 MCG tablet Take by mouth.   levothyroxine  (SYNTHROID ) 112 MCG tablet TAKE 1 TABLET BY MOUTH DAILY   melatonin (MELATONIN MAXIMUM STRENGTH) 5 MG TABS Take by mouth.   naratriptan (AMERGE) 2.5 MG tablet Take 2.5 mg by mouth.   Nerve Stimulator (NERIVIO) DEVI USE ONE 45 MINUTE TREATMENT EVERY OTHER DAY FOR PREVENTION OR AT ONSET OF MIGRAINE   Probiotic Product (PROBIOTIC PO) Take by mouth.   triamcinolone  cream (KENALOG ) 0.1 % Apply 1 Application topically 2 (two) times daily.   [DISCONTINUED] amitriptyline  (ELAVIL ) 50 MG tablet TAKE 1 TABLET BY MOUTH DAILY   [DISCONTINUED] insulin  aspart (NOVOLOG ) 100 UNIT/ML injection Inject 160 Units into the skin  daily. Inject up to 1.6 mLs (160 Units total) into the skin as directed.   [DISCONTINUED] methocarbamol (ROBAXIN) 500 MG tablet Take 1 tablet every 8 hours by oral route as needed.   [DISCONTINUED] verapamil  (CALAN -SR) 120 MG CR tablet Take 120 mg by mouth daily.   amitriptyline  (ELAVIL ) 50 MG tablet Take 1 tablet (50 mg total) by mouth daily.   atorvastatin  (LIPITOR) 20 MG tablet Take 1 tablet (20 mg total) by mouth daily.   gabapentin  (NEURONTIN ) 300 MG capsule Take 1 capsule (300 mg total) by mouth 2 (two) times daily.   insulin  aspart (NOVOLOG ) 100 UNIT/ML injection Inject 160 Units into the skin daily. Inject up to 1.6 mLs (160 Units total) into the skin as directed.   omeprazole  (PRILOSEC) 20 MG capsule Take 1 capsule (20 mg total) by mouth daily.   ondansetron  (ZOFRAN ) 4 MG tablet Take 1 tablet (4 mg total) by mouth 2 (two) times daily as needed for nausea or vomiting.   verapamil  (CALAN -SR) 120 MG CR tablet Take 1 tablet (120 mg total) by mouth daily.   [DISCONTINUED] amitriptyline  (ELAVIL ) 50 MG tablet TAKE 1 TABLET BY MOUTH DAILY   [DISCONTINUED] atorvastatin  (LIPITOR) 20 MG tablet TAKE 1 TABLET BY MOUTH DAILY   [DISCONTINUED] gabapentin  (NEURONTIN ) 300 MG capsule TAKE 1 CAPSULE TWICE DAILY   [DISCONTINUED] omeprazole  (PRILOSEC) 20 MG capsule TAKE 1 CAPSULE BY MOUTH ONCE DAILY   [DISCONTINUED] ondansetron  (ZOFRAN ) 4 MG tablet Take 4 mg by mouth.   [DISCONTINUED] predniSONE  (DELTASONE ) 10 MG tablet Take 6 tablets x 1 day and then decrease by 1/2 tablet per day until down to zero mg.   [DISCONTINUED] topiramate  (TOPAMAX ) 25 MG tablet TAKE 1 TO 2 TABLETS BY MOUTH EVERY DAY (Patient not taking: Reported on 07/06/2024)   No facility-administered encounter medications on file as of 07/06/2024.     Lab Results  Component Value Date   WBC 9.4 07/06/2024   HGB 14.7 07/06/2024   HCT 44.0 07/06/2024   PLT 325 07/06/2024   GLUCOSE 110 (H) 07/06/2024   CHOL 163  07/06/2024   TRIG 127  07/06/2024   HDL 39 (L) 07/06/2024   LDLCALC 101 (H) 07/06/2024   ALT 40 07/06/2024   AST 26 07/06/2024   NA 141 07/06/2024   K 4.3 07/06/2024   CL 104 07/06/2024   CREATININE 1.12 07/06/2024   BUN 18 07/06/2024   CO2 22 07/06/2024   TSH 0.670 07/06/2024   PSA 0.91 02/10/2023   HGBA1C 6.6 (A) 03/30/2024   MICROALBUR 0.3 03/30/2024       Assessment & Plan:  Routine general medical examination at a health care facility  Health care maintenance Assessment & Plan: Physical today 07/06/24.  Colonoscopy 10/15/19 - celiac and removal of polyp (tubular adenoma).  Recommended f/u colonoscopy in 5 years. Check psa.     DM type 1 with diabetic peripheral neuropathy (HCC)  Hypercholesterolemia Assessment & Plan: Continue lipitor. Low cholesterol diet and exercise. Check lipid panel.   Orders: -     Lipid panel -     Hepatic function panel -     TSH -     CBC with Differential/Platelet  Type 1 diabetes mellitus with diabetic neuropathy [facility]) Assessment & Plan: Seeing endocrinology. Sugars doing well. Request refill insulin  until can f/u with endocrinology.  Lab Results  Component Value Date   HGBA1C 6.6 (A) 03/30/2024     Orders: -     Basic metabolic panel with GFR  Prostate cancer screening -     PSA  Obstructive sleep apnea Assessment & Plan: Continue cpap.    Neuropathy Assessment & Plan: Taking gabapentin . Stable.    Acquired hypothyroidism Assessment & Plan: Continue thyroid  replacement. Follow tsh.    Gastroesophageal reflux disease, unspecified whether esophagitis present Assessment & Plan: Continue prilosec.    Dizziness Assessment & Plan: Persistent headaches and severe vertigo as outlined. Has tried multiple medications as outlined. Continue f/u with neurology. Due to insurance change, will need to establish with a new neurologist.    Acute nonintractable headache, unspecified headache type Assessment & Plan: Persistent headaches and severe  vertigo as outlined. Has tried multiple medications as outlined. Continue f/u with neurology. Due to insurance change, will need to establish with a new neurologist.   Orders: -     Ambulatory referral to Neurology  Other orders -     Amitriptyline  HCl; Take 1 tablet (50 mg total) by mouth daily.  Dispense: 90 tablet; Refill: 1 -     Gabapentin ; Take 1 capsule (300 mg total) by mouth 2 (two) times daily.  Dispense: 180 capsule; Refill: 1 -     Ondansetron  HCl; Take 1 tablet (4 mg total) by mouth 2 (two) times daily as needed for nausea or vomiting.  Dispense: 20 tablet; Refill: 0 -     Omeprazole ; Take 1 capsule (20 mg total) by mouth daily.  Dispense: 90 capsule; Refill: 1 -     Atorvastatin  Calcium ; Take 1 tablet (20 mg total) by mouth daily.  Dispense: 90 tablet; Refill: 3 -     Verapamil  HCl ER; Take 1 tablet (120 mg total) by mouth daily.  Dispense: 90 tablet; Refill: 1 -     Insulin  Aspart; Inject 160 Units into the skin daily. Inject up to 1.6 mLs (160 Units total) into the skin as directed.  Dispense: 150 mL; Refill: 1     Allena Hamilton, MD

## 2024-07-07 ENCOUNTER — Ambulatory Visit: Payer: Self-pay | Admitting: Internal Medicine

## 2024-07-07 ENCOUNTER — Encounter: Payer: Self-pay | Admitting: Internal Medicine

## 2024-07-07 ENCOUNTER — Telehealth: Payer: Self-pay | Admitting: Internal Medicine

## 2024-07-07 ENCOUNTER — Other Ambulatory Visit: Payer: Self-pay | Admitting: Internal Medicine

## 2024-07-07 DIAGNOSIS — R519 Headache, unspecified: Secondary | ICD-10-CM

## 2024-07-07 LAB — CBC WITH DIFFERENTIAL/PLATELET
Basophils Absolute: 0.1 x10E3/uL (ref 0.0–0.2)
Basos: 1 %
EOS (ABSOLUTE): 0.5 x10E3/uL — ABNORMAL HIGH (ref 0.0–0.4)
Eos: 5 %
Hematocrit: 44 % (ref 37.5–51.0)
Hemoglobin: 14.7 g/dL (ref 13.0–17.7)
Immature Grans (Abs): 0 x10E3/uL (ref 0.0–0.1)
Immature Granulocytes: 0 %
Lymphocytes Absolute: 3.1 x10E3/uL (ref 0.7–3.1)
Lymphs: 33 %
MCH: 28.7 pg (ref 26.6–33.0)
MCHC: 33.4 g/dL (ref 31.5–35.7)
MCV: 86 fL (ref 79–97)
Monocytes Absolute: 0.6 x10E3/uL (ref 0.1–0.9)
Monocytes: 7 %
Neutrophils Absolute: 5.1 x10E3/uL (ref 1.4–7.0)
Neutrophils: 54 %
Platelets: 325 x10E3/uL (ref 150–450)
RBC: 5.13 x10E6/uL (ref 4.14–5.80)
RDW: 13 % (ref 11.6–15.4)
WBC: 9.4 x10E3/uL (ref 3.4–10.8)

## 2024-07-07 LAB — BASIC METABOLIC PANEL WITH GFR
BUN/Creatinine Ratio: 16 (ref 9–20)
BUN: 18 mg/dL (ref 6–24)
CO2: 22 mmol/L (ref 20–29)
Calcium: 9.5 mg/dL (ref 8.7–10.2)
Chloride: 104 mmol/L (ref 96–106)
Creatinine, Ser: 1.12 mg/dL (ref 0.76–1.27)
Glucose: 110 mg/dL — ABNORMAL HIGH (ref 70–99)
Potassium: 4.3 mmol/L (ref 3.5–5.2)
Sodium: 141 mmol/L (ref 134–144)
eGFR: 82 mL/min/1.73 (ref >59)

## 2024-07-07 LAB — TSH: TSH: 0.67 u[IU]/mL (ref 0.450–4.500)

## 2024-07-07 LAB — LIPID PANEL
Chol/HDL Ratio: 4.2 ratio (ref 0.0–5.0)
Cholesterol, Total: 163 mg/dL (ref 100–199)
HDL: 39 mg/dL — ABNORMAL LOW (ref >39)
LDL Chol Calc (NIH): 101 mg/dL — ABNORMAL HIGH (ref 0–99)
Triglycerides: 127 mg/dL (ref 0–149)
VLDL Cholesterol Cal: 23 mg/dL (ref 5–40)

## 2024-07-07 LAB — HEPATIC FUNCTION PANEL
ALT: 40 IU/L (ref 0–44)
AST: 26 IU/L (ref 0–40)
Albumin: 4 g/dL — ABNORMAL LOW (ref 4.1–5.1)
Alkaline Phosphatase: 129 IU/L — ABNORMAL HIGH (ref 47–123)
Bilirubin Total: 0.4 mg/dL (ref 0.0–1.2)
Bilirubin, Direct: 0.14 mg/dL (ref 0.00–0.40)
Total Protein: 6.8 g/dL (ref 6.0–8.5)

## 2024-07-07 LAB — PSA: Prostate Specific Ag, Serum: 0.9 ng/mL (ref 0.0–4.0)

## 2024-07-07 MED ORDER — OMEPRAZOLE 20 MG PO CPDR: 20.0000 mg | DELAYED_RELEASE_CAPSULE | Freq: Every day | ORAL | 1 refills | Status: AC

## 2024-07-07 MED ORDER — VERAPAMIL HCL ER 120 MG PO TBCR: 120.0000 mg | EXTENDED_RELEASE_TABLET | Freq: Every day | ORAL | 1 refills | Status: AC

## 2024-07-07 MED ORDER — AMITRIPTYLINE HCL 50 MG PO TABS: 50.0000 mg | ORAL_TABLET | Freq: Every day | ORAL | 1 refills | Status: AC

## 2024-07-07 MED ORDER — ONDANSETRON HCL 4 MG PO TABS: 4.0000 mg | ORAL_TABLET | Freq: Two times a day (BID) | ORAL | 0 refills | Status: DC | PRN

## 2024-07-07 MED ORDER — GABAPENTIN 300 MG PO CAPS: 300.0000 mg | ORAL_CAPSULE | Freq: Two times a day (BID) | ORAL | 1 refills | Status: AC

## 2024-07-07 MED ORDER — INSULIN ASPART 100 UNIT/ML IJ SOLN: 160.0000 [IU] | Freq: Every day | INTRAMUSCULAR | 1 refills | Status: AC

## 2024-07-07 MED ORDER — ATORVASTATIN CALCIUM 20 MG PO TABS: 20.0000 mg | ORAL_TABLET | Freq: Every day | ORAL | 3 refills | Status: DC

## 2024-07-07 NOTE — Telephone Encounter (Signed)
 Message sent to [facility] regarding refills.

## 2024-07-07 NOTE — Assessment & Plan Note (Signed)
 Seeing endocrinology. Sugars doing well. Request refill insulin  until can f/u with endocrinology.  Lab Results  Component Value Date   HGBA1C 6.6 (A) 03/30/2024

## 2024-07-07 NOTE — Assessment & Plan Note (Signed)
Taking gabapentin. Stable

## 2024-07-07 NOTE — Assessment & Plan Note (Signed)
 Persistent headaches and severe vertigo as outlined. Has tried multiple medications as outlined. Continue f/u with neurology. Due to insurance change, will need to establish with a new neurologist.

## 2024-07-07 NOTE — Assessment & Plan Note (Signed)
Continue thyroid replacement.  Follow tsh.   

## 2024-07-07 NOTE — Assessment & Plan Note (Signed)
 Physical today 07/06/24.  Colonoscopy 10/15/19 - celiac and removal of polyp (tubular adenoma).  Recommended f/u colonoscopy in 5 years. Check psa.

## 2024-07-07 NOTE — Assessment & Plan Note (Signed)
 Continue cpap.

## 2024-07-07 NOTE — Assessment & Plan Note (Signed)
 Continue lipitor. Low cholesterol diet and exercise. Check lipid panel.

## 2024-07-07 NOTE — Assessment & Plan Note (Signed)
 Continue prilosec

## 2024-07-09 ENCOUNTER — Other Ambulatory Visit: Payer: Self-pay

## 2024-07-09 ENCOUNTER — Other Ambulatory Visit: Payer: Self-pay | Admitting: Internal Medicine

## 2024-07-09 DIAGNOSIS — R7989 Other specified abnormal findings of blood chemistry: Secondary | ICD-10-CM

## 2024-07-09 DIAGNOSIS — E7800 Pure hypercholesterolemia, unspecified: Secondary | ICD-10-CM

## 2024-07-09 MED ORDER — ATORVASTATIN CALCIUM 40 MG PO TABS: 40.0000 mg | ORAL_TABLET | Freq: Every day | ORAL | 1 refills | Status: AC

## 2024-07-09 NOTE — Progress Notes (Signed)
 Rx sent in for lipitor 40mg .

## 2024-07-10 ENCOUNTER — Telehealth: Payer: Self-pay

## 2024-07-10 NOTE — Telephone Encounter (Signed)
 Please call and notify Eric Hayden that I had tried to [facility] and they do not take his current insurance. Have him contact us  once he has his new insurance next year and I will place referral again to see if covered under new insurance.

## 2024-07-10 NOTE — Telephone Encounter (Signed)
 Copied from CRM #[id number]. Topic: Referral - Status >> Jul 10, 2024  8:59 AM Victoria A wrote: Reason for CRM: Pam with [facility] called said that they are not in network with patients insurance. Patient may choose different insurance next year and could try again with their office.

## 2024-07-11 NOTE — Telephone Encounter (Signed)
 Pt notified. Pt is in agreement

## 2024-07-17 ENCOUNTER — Other Ambulatory Visit: Payer: Self-pay | Admitting: Internal Medicine

## 2024-07-18 ENCOUNTER — Telehealth: Payer: Self-pay

## 2024-07-18 ENCOUNTER — Other Ambulatory Visit (HOSPITAL_COMMUNITY): Payer: Self-pay

## 2024-07-18 NOTE — Telephone Encounter (Signed)
 Pharmacy Patient Advocate Encounter   Received notification from Onbase that prior authorization for Cyproheptadine Hcl 4 is required/requested.   Insurance verification completed.   The patient is insured through [facility].   Per test claim: PA required; PA submitted to above mentioned insurance via Latent Key/confirmation #/EOC [id number] Status is pending

## 2024-07-23 MED ORDER — OSELTAMIVIR PHOSPHATE 75 MG PO CAPS: 75.0000 mg | ORAL_CAPSULE | Freq: Every day | ORAL | 0 refills | Status: AC

## 2024-07-23 NOTE — Telephone Encounter (Signed)
 Spoke to [employer]. He is not currently having any symptoms. Son diagnosed with influenza. Will treat with prophylactically - tamiflu  75mg  q day. If symptoms, change to bid. Call if any problems.

## 2024-07-23 NOTE — Addendum Note (Signed)
 Addended by: GLENDIA ALLENA RAMAN on: 07/23/2024 05:03 PM   Modules accepted: Orders

## 2024-07-30 ENCOUNTER — Other Ambulatory Visit

## 2024-07-30 NOTE — Telephone Encounter (Signed)
 Per review, amitriptyline , atorvastatin , gabapentin , omeprazole  and insulin  - prescriptions sent in on 07/07/24. Please confirm with [facility] f[facility] have rx. If not, ok to refill. Will need to clarify with Alm what prescriptions he needs.

## 2024-07-31 ENCOUNTER — Ambulatory Visit: Admitting: Internal Medicine

## 2024-08-01 ENCOUNTER — Other Ambulatory Visit: Payer: Self-pay | Admitting: Internal Medicine

## 2024-08-03 MED ORDER — ONDANSETRON HCL 4 MG PO TABS: 4.0000 mg | ORAL_TABLET | Freq: Two times a day (BID) | ORAL | 0 refills | Status: AC | PRN

## 2024-08-03 NOTE — Telephone Encounter (Signed)
 I sent in the prescription for ondansetron  4mg  tablets. Please call pharmacy and confirm directions on Zonisamide 100 MG and when last refilled.

## 2024-08-03 NOTE — Addendum Note (Signed)
 Addended by: GLENDIA ALLENA RAMAN on: 08/03/2024 06:47 AM   Modules accepted: Orders

## 2024-08-05 NOTE — Telephone Encounter (Signed)
 Still need to confirm directions on the medication.  Please call pt and have him read rx to you - for directions.

## 2024-08-07 MED ORDER — ZONISAMIDE 100 MG PO CAPS: ORAL_CAPSULE | ORAL | 0 refills | Status: AC

## 2024-08-07 NOTE — Telephone Encounter (Signed)
 My chart message sen to Eric Hayden for notification.

## 2024-08-07 NOTE — Telephone Encounter (Signed)
Neuro referral pended

## 2024-08-07 NOTE — Telephone Encounter (Signed)
 Rx sent in for zonisamide .

## 2024-08-07 NOTE — Addendum Note (Signed)
 Addended by: GLENDIA ALLENA RAMAN on: 08/07/2024 12:21 PM   Modules accepted: Orders

## 2024-08-07 NOTE — Addendum Note (Signed)
 Addended by: Sahara Fujimoto on: 08/07/2024 01:24 PM   Modules accepted: Orders

## 2024-08-08 NOTE — Telephone Encounter (Signed)
 Please call Eric Hayden and inform him that I have placed the order for the referral. I placed the order for the headache clinic (Dr Oneita) in [place]. Tell him to let me know if not in network. Also, regarding the therapist, we can send him a lit of local therapist. A referral in not needed for a therapist. If he wants a referral to a psychiatrist, I can place an order for a psychiatrist.  Let me  know if any questions or problems.

## 2024-08-08 NOTE — Addendum Note (Signed)
 Addended by: GLENDIA ALLENA RAMAN on: 08/08/2024 03:03 AM   Modules accepted: Orders

## 2024-08-10 NOTE — Addendum Note (Signed)
 Addended by: GLENDIA ALLENA RAMAN on: 08/10/2024 09:07 PM   Modules accepted: Orders

## 2024-08-10 NOTE — Telephone Encounter (Signed)
 Order placed for neurology referral to [facility] neurology. Canceled referral to headache clinic. Pt notified via my chart.

## 2024-08-14 ENCOUNTER — Encounter: Payer: Self-pay | Admitting: Neurology

## 2024-08-21 ENCOUNTER — Ambulatory Visit: Admitting: Internal Medicine

## 2024-08-28 ENCOUNTER — Ambulatory Visit: Admitting: Internal Medicine

## 2024-09-06 ENCOUNTER — Encounter: Payer: Self-pay | Admitting: Internal Medicine

## 2024-09-07 ENCOUNTER — Telehealth: Payer: Self-pay

## 2024-09-07 ENCOUNTER — Other Ambulatory Visit (HOSPITAL_COMMUNITY): Payer: Self-pay

## 2024-09-07 NOTE — Telephone Encounter (Signed)
 Pharmacy Patient Advocate Encounter   Received notification from Pt Calls Messages that prior authorization for Dexcom G6 sensor is required/requested.   Insurance verification completed.   The patient is insured through [employer].   Per test claim: PA required; PA submitted to above mentioned insurance via Latent Key/confirmation #/EOC [id number] Status is pending

## 2024-09-11 ENCOUNTER — Encounter: Admitting: Internal Medicine

## 2024-09-13 ENCOUNTER — Ambulatory Visit: Admitting: Internal Medicine

## 2024-11-06 ENCOUNTER — Ambulatory Visit: Admitting: Internal Medicine

## 2024-11-27 ENCOUNTER — Ambulatory Visit: Payer: Self-pay | Admitting: Neurology

## 2024-12-19 ENCOUNTER — Ambulatory Visit: Admitting: Dermatology

## 2024-12-25 ENCOUNTER — Ambulatory Visit: Admitting: Dermatology
